# Patient Record
Sex: Female | Born: 1960 | Race: Black or African American | Hispanic: No | Marital: Single | Smoking: Current some day smoker
Health system: Southern US, Community
[De-identification: ages and names within clinical notes are randomized; demographics above are authoritative.]

## PROBLEM LIST (undated history)

## (undated) DIAGNOSIS — E785 Hyperlipidemia, unspecified: Secondary | ICD-10-CM

## (undated) DIAGNOSIS — I422 Other hypertrophic cardiomyopathy: Secondary | ICD-10-CM

## (undated) DIAGNOSIS — E119 Type 2 diabetes mellitus without complications: Secondary | ICD-10-CM

## (undated) DIAGNOSIS — I10 Essential (primary) hypertension: Secondary | ICD-10-CM

## (undated) DIAGNOSIS — I2510 Atherosclerotic heart disease of native coronary artery without angina pectoris: Secondary | ICD-10-CM

## (undated) HISTORY — DX: Type 2 diabetes mellitus without complications: E11.9

## (undated) HISTORY — DX: Hyperlipidemia, unspecified: E78.5

## (undated) HISTORY — PX: CORONARY ANGIOPLASTY WITH STENT PLACEMENT: SHX49

## (undated) HISTORY — DX: Other hypertrophic cardiomyopathy: I42.2

## (undated) HISTORY — DX: Essential (primary) hypertension: I10

---

## 2004-10-16 ENCOUNTER — Emergency Department: Payer: Self-pay | Admitting: Emergency Medicine

## 2004-10-16 ENCOUNTER — Other Ambulatory Visit: Payer: Self-pay

## 2005-02-27 ENCOUNTER — Ambulatory Visit: Payer: Self-pay | Admitting: Family Medicine

## 2011-06-07 ENCOUNTER — Inpatient Hospital Stay: Payer: Self-pay | Admitting: Internal Medicine

## 2011-07-13 ENCOUNTER — Inpatient Hospital Stay (HOSPITAL_COMMUNITY)
Admission: EM | Admit: 2011-07-13 | Discharge: 2011-07-14 | DRG: 313 | Disposition: A | Discharge status: Home / Self Care | Payer: Self-pay | Attending: Cardiology | Admitting: Cardiology

## 2011-07-13 ENCOUNTER — Emergency Department (HOSPITAL_COMMUNITY): Payer: Self-pay

## 2011-07-13 DIAGNOSIS — R079 Chest pain, unspecified: Principal | ICD-10-CM | POA: Diagnosis present

## 2011-07-13 DIAGNOSIS — E119 Type 2 diabetes mellitus without complications: Secondary | ICD-10-CM | POA: Diagnosis present

## 2011-07-13 DIAGNOSIS — I252 Old myocardial infarction: Secondary | ICD-10-CM

## 2011-07-13 DIAGNOSIS — Z87891 Personal history of nicotine dependence: Secondary | ICD-10-CM

## 2011-07-13 DIAGNOSIS — Z7982 Long term (current) use of aspirin: Secondary | ICD-10-CM

## 2011-07-13 DIAGNOSIS — I10 Essential (primary) hypertension: Secondary | ICD-10-CM | POA: Diagnosis present

## 2011-07-13 DIAGNOSIS — E785 Hyperlipidemia, unspecified: Secondary | ICD-10-CM | POA: Diagnosis present

## 2011-07-13 LAB — BASIC METABOLIC PANEL
GFR calc Af Amer: >60 mL/min (ref >60)
Glucose, Bld: 101 mg/dL — ABNORMAL HIGH (ref 70–99)
Sodium: 140 mEq/L (ref 135–145)

## 2011-07-13 LAB — CBC
MCH: 30.1 pg (ref 26.0–34.0)
MCHC: 34.1 g/dL (ref 30.0–36.0)
Platelets: 294 10*3/uL (ref 150–400)

## 2011-07-13 LAB — DIFFERENTIAL
Basophils Relative: 0 % (ref 0–1)
Eosinophils Absolute: 0.2 10*3/uL (ref 0.0–0.7)
Monocytes Absolute: 0.7 10*3/uL (ref 0.1–1.0)
Monocytes Relative: 5 % (ref 3–12)

## 2011-07-14 ENCOUNTER — Inpatient Hospital Stay (HOSPITAL_COMMUNITY): Payer: Self-pay

## 2011-07-14 LAB — DIFFERENTIAL
Basophils Absolute: 0 10*3/uL (ref 0.0–0.1)
Basophils Relative: 0 % (ref 0–1)
Eosinophils Relative: 1 % (ref 0–5)
Monocytes Absolute: 0.7 10*3/uL (ref 0.1–1.0)
Neutro Abs: 8.3 10*3/uL — ABNORMAL HIGH (ref 1.7–7.7)

## 2011-07-14 LAB — CK TOTAL AND CKMB (NOT AT ARMC)
CK, MB: 1.7 ng/mL (ref 0.3–4.0)
Relative Index: INVALID (ref 0.0–2.5)

## 2011-07-14 LAB — HEPATIC FUNCTION PANEL
ALT: 26 U/L (ref 0–35)
AST: 19 U/L (ref 0–37)
Albumin: 3.9 g/dL (ref 3.5–5.2)
Total Bilirubin: 0.3 mg/dL (ref 0.3–1.2)

## 2011-07-14 LAB — PROTIME-INR
INR: 1.06 (ref 0.00–1.49)
Prothrombin Time: 14 seconds (ref 11.6–15.2)

## 2011-07-14 LAB — HEMOGLOBIN A1C
Hgb A1c MFr Bld: 10.3 % — ABNORMAL HIGH (ref <5.7)
Mean Plasma Glucose: 249 mg/dL — ABNORMAL HIGH (ref <117)

## 2011-07-14 LAB — CARDIAC PANEL(CRET KIN+CKTOT+MB+TROPI)
Relative Index: INVALID (ref 0.0–2.5)
Total CK: 62 U/L (ref 7–177)
Total CK: 73 U/L (ref 7–177)

## 2011-07-14 LAB — GLUCOSE, CAPILLARY: Glucose-Capillary: 129 mg/dL — ABNORMAL HIGH (ref 70–99)

## 2011-07-14 LAB — PLATELET INHIBITION P2Y12: Platelet Function  P2Y12: 183 [PRU] — ABNORMAL LOW (ref 194–418)

## 2011-07-14 LAB — LIPID PANEL
Cholesterol: 142 mg/dL (ref 0–200)
HDL: 41 mg/dL (ref >39)
Total CHOL/HDL Ratio: 3.5 RATIO
Triglycerides: 94 mg/dL (ref <150)
VLDL: 19 mg/dL (ref 0–40)

## 2011-07-14 LAB — CBC
MCHC: 33.7 g/dL (ref 30.0–36.0)
RDW: 13 % (ref 11.5–15.5)

## 2011-07-14 LAB — APTT: aPTT: 105 seconds — ABNORMAL HIGH (ref 24–37)

## 2011-07-14 MED ORDER — TECHNETIUM TC 99M TETROFOSMIN IV KIT
10.0000 | PACK | Freq: Once | INTRAVENOUS | Status: AC | PRN
  Administered 2011-07-14: 10 via INTRAVENOUS

## 2011-07-14 MED ORDER — TECHNETIUM TC 99M TETROFOSMIN IV KIT
30.0000 | PACK | Freq: Once | INTRAVENOUS | Status: AC | PRN
  Administered 2011-07-14: 30 via INTRAVENOUS

## 2011-08-01 LAB — HM DIABETES EYE EXAM

## 2011-08-22 NOTE — Discharge Summary (Signed)
NAMESTEPHIE, Kirby               ACCOUNT NO.:  [id number]  MEDICAL RECORD NO.:  [id number]  LOCATION:  3740                         FACILITY:  [facility]  PHYSICIAN:  Rudra Hobbins N. Sharyn Lull, M.D. DATE OF BIRTH:  1960-12-21  DATE OF ADMISSION:  07/13/2011 DATE OF DISCHARGE:  07/14/2011                              DISCHARGE SUMMARY   ADMITTING DIAGNOSES: 1. Recurrent chest pain, rule out coronary insufficiency. 2. Coronary artery disease. 3. History of non-Q-wave myocardial infarction in the past status post     percutaneous coronary intervention to right coronary artery in the     past. 4. Hypertension. 5. Non-insulin-dependent diabetes mellitus. 6. Hypercholesteremia. 7. History of tobacco abuse. 8. Positive family history of coronary artery disease.  FINAL DIAGNOSES: 1. Status post chest pain, myocardial infarction ruled out, negative     Lexiscan Myoview. 2. Coronary artery disease. 3. History of non-Q-wave myocardial infarction in the past status post     percutaneous coronary intervention to right coronary artery. 4. Hypertension. 5. Non-insulin-dependent diabetes mellitus. 6. History of tobacco abuse. 7. Positive family history of coronary artery disease.  DISCHARGE HOME MEDICATIONS: 1. Lisinopril 10 mg 1 tablet daily. 2. Imdur 30 mg 1 tablet daily. 3. Plavix 75 mg 1 tablet daily. 4. Enteric-coated aspirin one tablet daily. 5. Glipizide 10 mg 1 tablet daily. 6. Lopressor 25 mg 1 tablet twice daily. 7. Simvastatin 40 mg 1 tablet daily. 8. Metformin 500 mg 1 tablet twice daily.  DIET:  Low-salt low-cholesterol 1800 calories ADA diet.  ACTIVITY:  As tolerated.  Follow up with me in 1 week.  CONDITION ON DISCHARGE:  Stable.  BRIEF HISTORY AND HOSPITAL COURSE:  Ms. Laurie Kirby is a 50 year old black female with past medical history significant for coronary artery disease, history of non-Q-wave myocardial infarction in the past, status post PCI to RCA in August, 2012,  hypertension, non-insulin-dependent diabetes mellitus, hypercholesteremia, history of tobacco abuse, positive family history of coronary artery disease came to the ER complaining of recurrent retrosternal chest pain described as throbbing, rated 8/10 while shopping in [employer].  Denies any nausea, vomiting, or diaphoresis.  Denies palpitation, lightheadedness, or syncope. Denies PND, orthopnea, or leg swelling.  Denies relation of chest pain to food, breathing, or movement.  The patient received morphine sulfate and IV nitro with relief of chest pain.  Denies cough fever chills. Denies any GERD symptoms.  PAST MEDICAL HISTORY:  As above.  PAST SURGICAL HISTORY:  She had PCI to RCA in the past.  ALLERGIES:  No known drug allergies.  MEDICATION AT HOME: She was on: 1. Aspirin 500 mg p.o. daily. 2. Plavix 75 mg p.o. daily. 3. Lopressor 25 mg p.o. b.i.d. 4. Lisinopril 10 mg p.o. daily. 5. Imdur 30 mg p.o. daily. 6. Metformin 500 mg p.o. b.i.d. 7. Glipizide 10 mg p.o. daily. 8. Simvastatin 40 mg p.o. daily.  SOCIAL HISTORY:  She is single, has 3 children.  Smoked half pack per day for 33 years, quit in May 2011.  She drinks beer occasionally socially and worked for [employer] in the past.  FAMILY HISTORY:  Father died of MI at the age of 49.  Mother died at the age of 49  due to sudden cardiac death.  One sister had amputation of the foot, two sisters and two brothers in good health.  PHYSICAL EXAMINATION:  GENERAL:  She was alert, awake, oriented x3, in no acute distress. VITAL SIGNS:  Blood pressure was 125/77, pulse was 67 and regular. HEENT:  Conjunctivae pink. NECK:  Supple.  No JVD.  No bruit. LUNGS:  Clear to auscultation without rhonchi or rales. CARDIOVASCULAR:  S1 and S2 was normal.  There was soft systolic murmur. There was no S3 or S4 gallop. ABDOMEN:  Soft.  Bowel sounds were present.  Nontender. EXTREMITIES:  No clubbing, cyanosis, or edema.  Her EKG showed  normal sinus rhythm with no acute ischemic changes.  LABORATORY DATA:  Hemoglobin was 13.5, hematocrit 39.6, Parrales count of 13.6 with no shift to the left.  Sodium was 140, potassium 4.0, chloride 106, bicarb 26, glucose 101, BUN 11, creatinine 0.52.  Hemoglobin A1c was elevated, it was 10.3.  Three sets of cardiac enzymes were normal. Cholesterol was 142, triglycerides 94, LDL of 82, HDL of 41.  Chest x-ray showed no acute cardiopulmonary process. Lexiscan showed no evidence of infarction or ischemia with EF of 62%, normal wall motion.  BRIEF HOSPITAL COURSE:  The patient was admitted to telemetry unit.  MI was ruled out by serial enzymes and EKG.  The patient subsequently underwent Lexiscan Myoview which showed no evidence of reversible ischemia.  The patient did not have any further episodes of chest pain during the hospital stay and was discharged home in stable condition on her home medications.  The patient was advised to follow up with me in 1 week.  CONDITION ON DISCHARGE:  Stable.     Laurie Kirby. Sharyn Lull, M.D.     [facility]  D:  08/07/2011  T:  08/08/2011  Job:  [id number]  Electronically Signed by Rinaldo Cloud M.D. on 08/22/2011 10:02:48 AM

## 2013-02-11 LAB — HM DIABETES EYE EXAM

## 2014-01-25 ENCOUNTER — Ambulatory Visit: Payer: Self-pay

## 2014-03-15 DIAGNOSIS — N950 Postmenopausal bleeding: Secondary | ICD-10-CM | POA: Insufficient documentation

## 2014-05-05 LAB — HM DIABETES EYE EXAM

## 2014-06-28 DEATH — deceased

## 2014-08-10 ENCOUNTER — Ambulatory Visit: Payer: Self-pay

## 2014-10-30 IMAGING — US US PELV - US TRANSVAGINAL
1 series · 13 of 25 positions shown · non-contrast
Comparison: Previous pelvic MRI dated February 27, 2005

CLINICAL DATA: Postmenopausal uterine bleeding

EXAM:
TRANSABDOMINAL AND TRANSVAGINAL ULTRASOUND OF PELVIS
TECHNIQUE: Both transabdominal and transvaginal ultrasound examinations of the
pelvis were performed. Transabdominal technique was performed for
global imaging of the pelvis including uterus, ovaries, adnexal
regions, and pelvic cul-de-sac. It was necessary to proceed with
endovaginal exam following the transabdominal exam to visualize the
uterus and endometrium and adnexal structures.

[Series 1: us pelv - us transvaginal · 0.28mm/px · 13 of 78 slices shown]
[im 1/78]
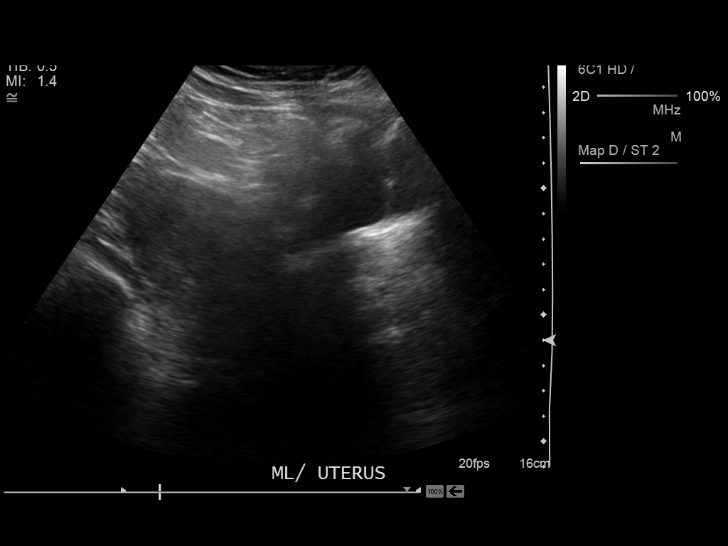
[im 7/78]
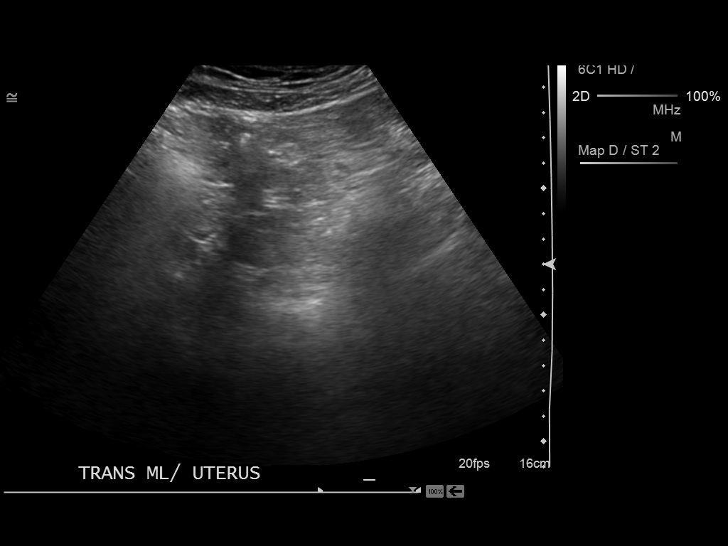
[im 13/78]
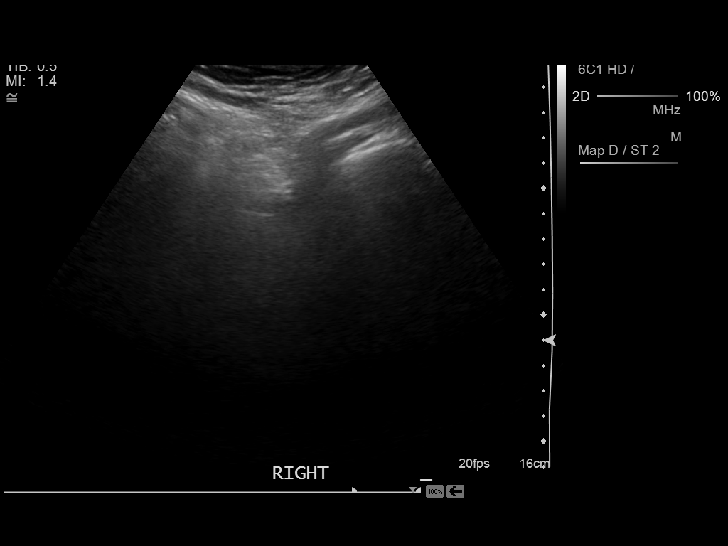
[im 20/78]
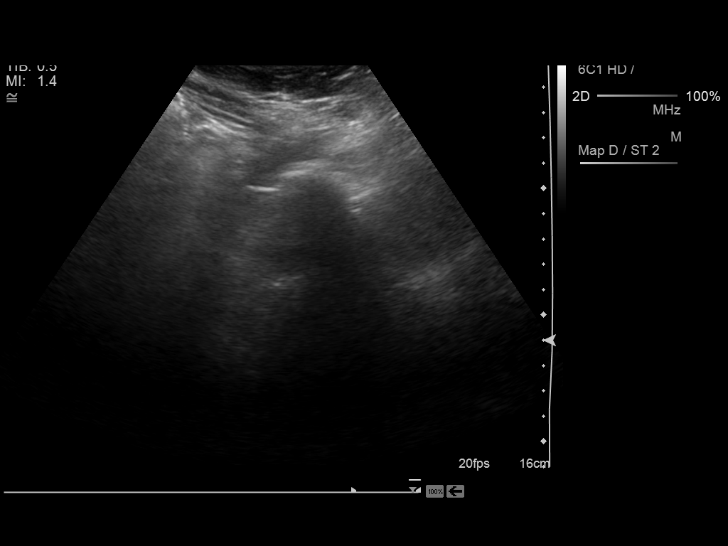
[im 26/78]
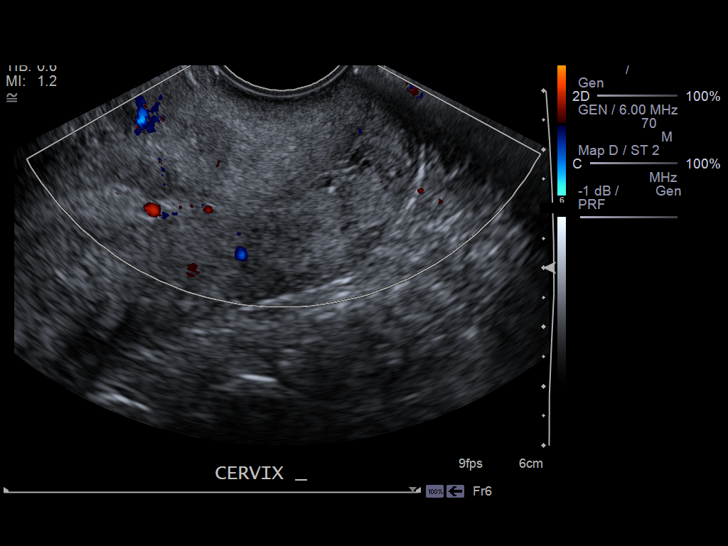
[im 33/78]
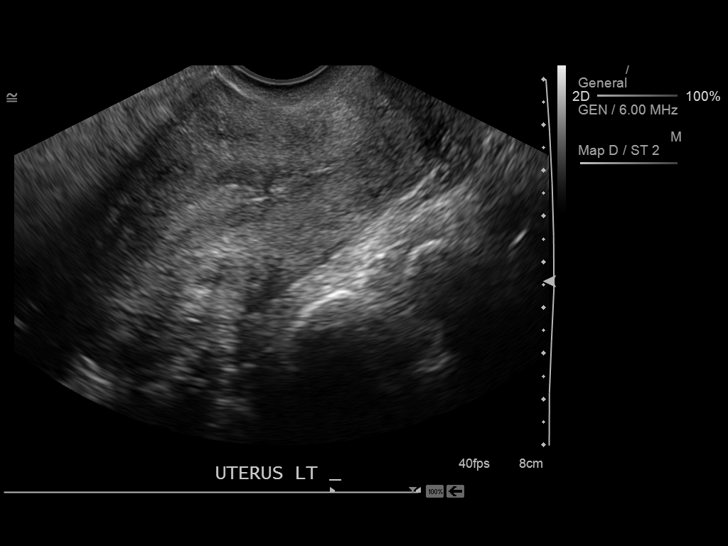
[im 39/78]
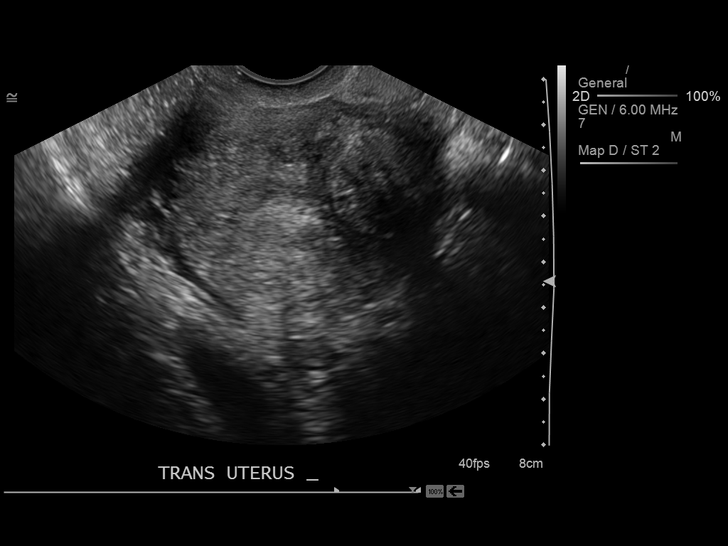
[im 45/78]
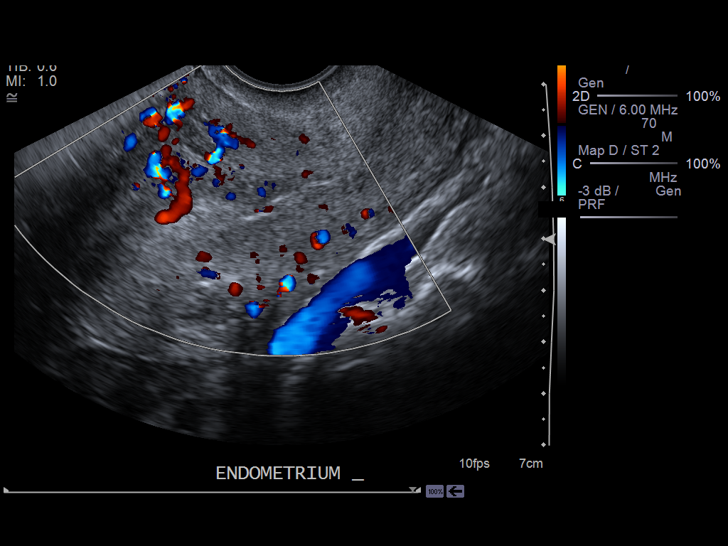
[im 52/78]
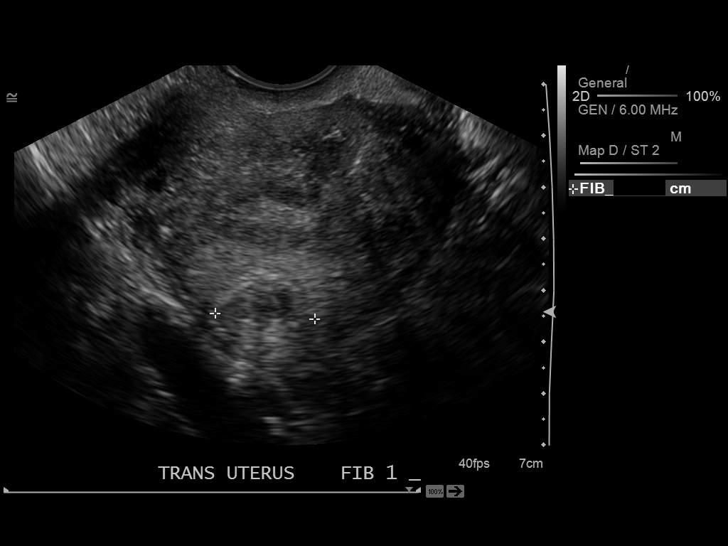
[im 58/78]
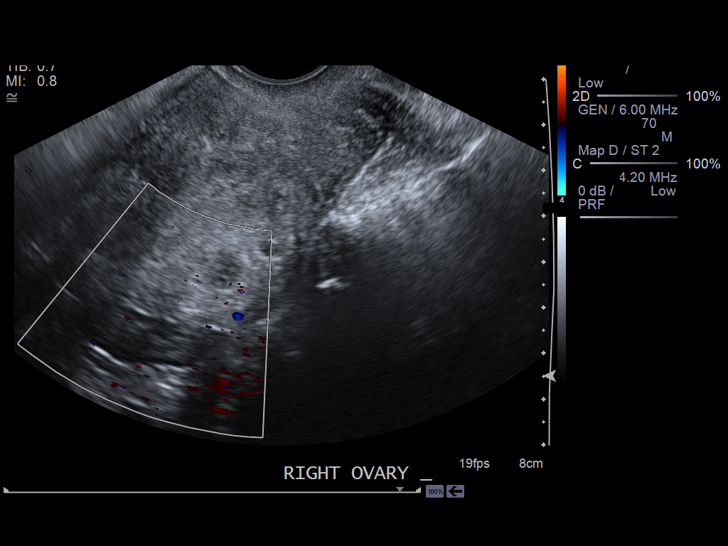
[im 65/78]
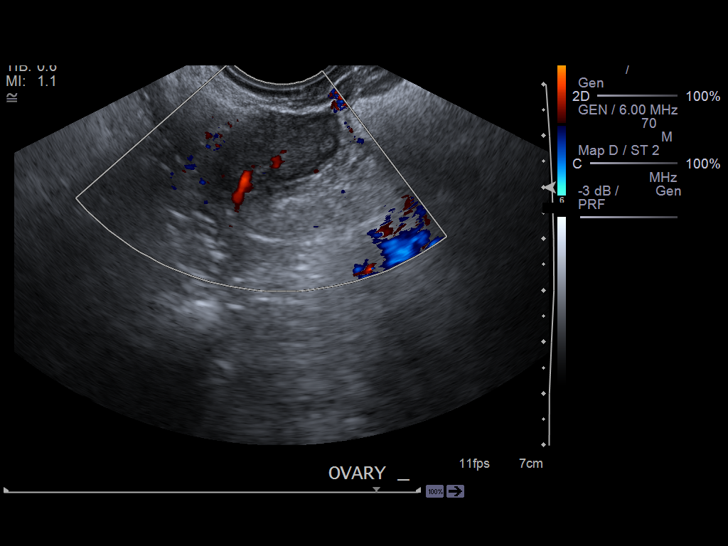
[im 71/78]
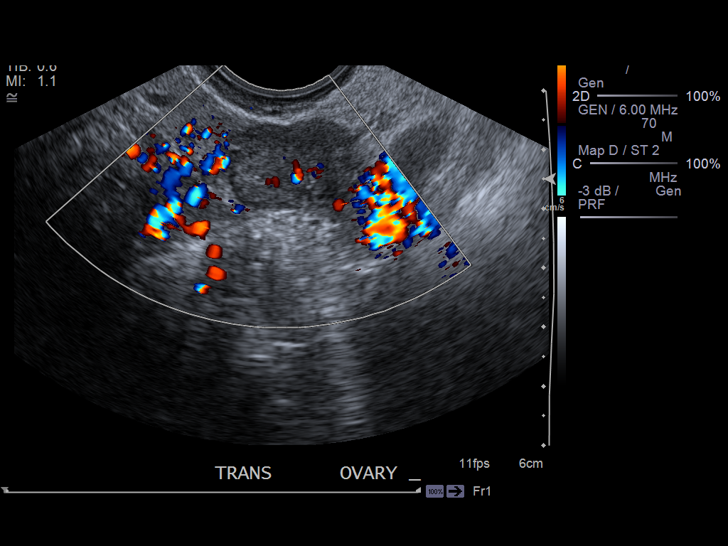
[im 78/78]
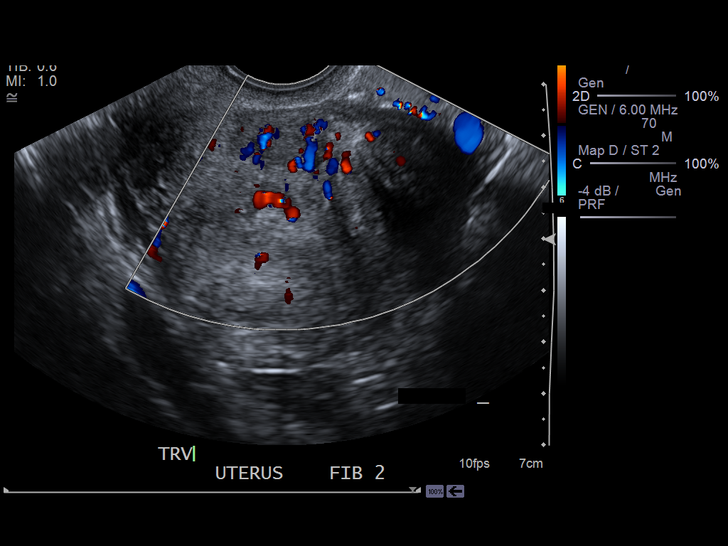

[13 of 25 positions shown; findings below may reference images not displayed]

FINDINGS: Uterus

Measurements: 7.9 x 3.9 x 6.4 cm. The echotexture of the uterus is
heterogeneous with multiple hypoechoic masses. The largest lies to
the left of midline posteriorly in the fundus and measures 3.1 x
x 3.6 cm. This is consistent with a leiomyoma. A second fibroid lies
more inferiorly in the posterior right lateral aspect of the uterine
corpus and measures 2.2 x 1.7 x 1.9 cm.

Endometrium

Thickness: 8 mm.  No focal abnormality visualized.

Right ovary

Measurements: 3.3 x 1.2 x 2.3 cm.. Normal appearance/no adnexal
mass.

Left ovary

Measurements: 3.1 x 1.8 x 2.2 cm. Normal appearance/no adnexal mass.

Other findings

There is a tiny amount of free fluid within the cul de sac.
IMPRESSION: 1. There are multiple hypoechoic foci within the uterus compatible
with fibroids with the largest lying to the left posteriorly in the
fundus measuring 3.6 cm in greatest dimension. This has increased in
size since the previous study.
2. The endometrial stripe measures 8 mm. In the setting of
post-menopausal bleeding, endometrial sampling is indicated to
exclude carcinoma. If results are benign, sonohysterogram should be
considered for focal lesion work-up. (Ref: Radiological Reasoning:
Algorithmic Workup of Abnormal Vaginal Bleeding with Endovaginal
Sonography and Sonohysterography. AJR 5118; 191:S68-73)
3. The ovaries are normal in echotexture and size for age. There is
a trace of free fluid in the cul de sac.

## 2015-04-13 ENCOUNTER — Other Ambulatory Visit: Payer: Self-pay

## 2015-04-14 LAB — CBC AND DIFFERENTIAL
HEMATOCRIT: 40 % (ref 36–46)
Hemoglobin: 13.5 g/dL (ref 12.0–16.0)
NEUTROS ABS: 11 /uL
Platelets: 309 10*3/uL (ref 150–399)

## 2015-04-25 ENCOUNTER — Ambulatory Visit: Payer: Self-pay

## 2015-05-09 ENCOUNTER — Other Ambulatory Visit: Payer: Self-pay

## 2015-05-09 LAB — LIPID PANEL
Cholesterol: 247 mg/dL — AB (ref 0–200)
HDL: 41 mg/dL (ref 35–70)
LDL CALC: 174 mg/dL
Triglycerides: 159 mg/dL (ref 40–160)

## 2015-05-09 LAB — HEMOGLOBIN A1C: Hemoglobin A1C: 10.4

## 2015-05-09 LAB — BASIC METABOLIC PANEL
BUN: 13 mg/dL (ref 4–21)
Creatinine: 0.6 mg/dL (ref 0.5–1.1)
GLUCOSE: 172 mg/dL
Potassium: 4.8 mmol/L (ref 3.4–5.3)
Sodium: 143 mmol/L (ref 137–147)

## 2015-05-11 ENCOUNTER — Ambulatory Visit: Payer: Self-pay | Admitting: Ophthalmology

## 2015-05-30 ENCOUNTER — Ambulatory Visit: Payer: Self-pay

## 2015-06-06 ENCOUNTER — Other Ambulatory Visit: Payer: Self-pay

## 2015-06-06 LAB — CBC AND DIFFERENTIAL: WBC: 16 10*3/mL

## 2015-07-27 ENCOUNTER — Ambulatory Visit: Payer: Self-pay

## 2015-07-27 DIAGNOSIS — E119 Type 2 diabetes mellitus without complications: Secondary | ICD-10-CM | POA: Insufficient documentation

## 2015-07-27 DIAGNOSIS — I2510 Atherosclerotic heart disease of native coronary artery without angina pectoris: Secondary | ICD-10-CM | POA: Insufficient documentation

## 2015-08-10 ENCOUNTER — Ambulatory Visit: Payer: Self-pay | Admitting: Ophthalmology

## 2015-08-10 LAB — HM DIABETES EYE EXAM

## 2015-10-11 ENCOUNTER — Ambulatory Visit
Admission: RE | Admit: 2015-10-11 | Discharge: 2015-10-11 | Disposition: A | Discharge status: Home / Self Care | Payer: Self-pay | Source: Ambulatory Visit | Attending: Oncology | Admitting: Oncology

## 2015-10-11 ENCOUNTER — Ambulatory Visit: Payer: Self-pay

## 2015-10-11 ENCOUNTER — Encounter: Payer: Self-pay | Admitting: *Deleted

## 2015-10-11 ENCOUNTER — Ambulatory Visit: Payer: Self-pay | Attending: Oncology | Admitting: *Deleted

## 2015-10-11 VITALS — BP 128/76 | HR 76 | Temp 98.6°F | Resp 20 | Ht 66.54 in | Wt 160.7 lb

## 2015-10-11 DIAGNOSIS — Z0000 Encounter for general adult medical examination without abnormal findings: Secondary | ICD-10-CM

## 2015-10-11 NOTE — Patient Instructions (Signed)
Gave patient hand-out, Women Staying Healthy, Active and Well from [facility], with education on breast health, pap smears, heart and colon health. 

## 2015-10-11 NOTE — Progress Notes (Signed)
Subjective:     Patient ID: Laurie LipaBeverly Kirby, female   DOB: [birth year], 54 y.o.   MRN: [id number]  HPI   Review of Systems     Objective:   Physical Exam  Pulmonary/Chest: Right breast exhibits no inverted nipple, no mass, no nipple discharge, no skin change and no tenderness. Left breast exhibits no inverted nipple, no mass, no nipple discharge, no skin change and no tenderness. Breasts are symmetrical.    Abdominal: There is no splenomegaly or hepatomegaly.  Genitourinary: No labial fusion. There is no rash, tenderness, lesion or injury on the right labia. There is no rash, tenderness, lesion or injury on the left labia. Cervix exhibits no motion tenderness, no discharge and no friability. Right adnexum displays no mass, no tenderness and no fullness. Left adnexum displays tenderness. Left adnexum displays no mass and no fullness. No erythema, tenderness or bleeding in the vagina. No foreign body around the vagina. No signs of injury around the vagina. Vaginal discharge found.  [place] non-odorous discharge noted on exam       Assessment:     54 year old Black [facility] pap smear and mammogram.  Clinical breast exam with diffuse fibroglandular tissue.  Taught self breast awareness.  Specimen collected for pap smear.  Patient refused rectal exam.  Patient has been screened for eligibility.  She does not have any insurance, Medicare or Medicaid.  She also meets financial eligibility.  Hand-out given on the Affordable Care Act.     Plan:     Screening mammogram ordered.  Specimen sent to lab.  Will follow-up per protocol.

## 2015-10-18 LAB — PAP LB AND HPV HIGH-RISK
HPV, HIGH-RISK: POSITIVE — AB
PAP SMEAR COMMENT: 0

## 2015-10-19 ENCOUNTER — Encounter: Payer: Self-pay | Admitting: *Deleted

## 2015-10-19 NOTE — Progress Notes (Signed)
Mailed letter to patient to inform her of her normal mammogram and HPV+ pap smear.  Next pap and mammogram due in one year.  [facility] [place].

## 2015-11-02 ENCOUNTER — Ambulatory Visit: Payer: Self-pay

## 2015-11-30 ENCOUNTER — Ambulatory Visit: Payer: Self-pay

## 2015-11-30 DIAGNOSIS — R87619 Unspecified abnormal cytological findings in specimens from cervix uteri: Secondary | ICD-10-CM | POA: Insufficient documentation

## 2015-11-30 DIAGNOSIS — B373 Candidiasis of vulva and vagina: Secondary | ICD-10-CM | POA: Insufficient documentation

## 2015-11-30 DIAGNOSIS — B3731 Acute candidiasis of vulva and vagina: Secondary | ICD-10-CM | POA: Insufficient documentation

## 2015-12-05 ENCOUNTER — Ambulatory Visit: Payer: Self-pay

## 2015-12-07 ENCOUNTER — Ambulatory Visit: Payer: Self-pay

## 2015-12-07 DIAGNOSIS — N898 Other specified noninflammatory disorders of vagina: Secondary | ICD-10-CM | POA: Insufficient documentation

## 2015-12-08 DIAGNOSIS — N898 Other specified noninflammatory disorders of vagina: Secondary | ICD-10-CM

## 2015-12-08 DIAGNOSIS — I25119 Atherosclerotic heart disease of native coronary artery with unspecified angina pectoris: Secondary | ICD-10-CM

## 2015-12-08 DIAGNOSIS — R87619 Unspecified abnormal cytological findings in specimens from cervix uteri: Secondary | ICD-10-CM

## 2015-12-08 DIAGNOSIS — B373 Candidiasis of vulva and vagina: Secondary | ICD-10-CM

## 2015-12-08 DIAGNOSIS — E119 Type 2 diabetes mellitus without complications: Secondary | ICD-10-CM

## 2015-12-08 DIAGNOSIS — B3731 Acute candidiasis of vulva and vagina: Secondary | ICD-10-CM

## 2016-01-04 ENCOUNTER — Other Ambulatory Visit: Payer: Self-pay

## 2016-01-04 DIAGNOSIS — E119 Type 2 diabetes mellitus without complications: Secondary | ICD-10-CM

## 2016-01-04 DIAGNOSIS — E785 Hyperlipidemia, unspecified: Secondary | ICD-10-CM

## 2016-01-05 LAB — COMPREHENSIVE METABOLIC PANEL
A/G RATIO: 1.3 (ref 1.1–2.5)
ALBUMIN: 4.2 g/dL (ref 3.5–5.5)
ALT: 14 IU/L (ref 0–32)
AST: 12 IU/L (ref 0–40)
Alkaline Phosphatase: 63 IU/L (ref 39–117)
BUN / CREAT RATIO: 11 (ref 9–23)
BUN: 11 mg/dL (ref 6–24)
CHLORIDE: 104 mmol/L (ref 96–106)
CO2: 23 mmol/L (ref 18–29)
Calcium: 9.4 mg/dL (ref 8.7–10.2)
Creatinine, Ser: 0.98 mg/dL (ref 0.57–1.00)
GFR, EST AFRICAN AMERICAN: 76 mL/min/{1.73_m2} (ref >59)
GFR, EST NON AFRICAN AMERICAN: 66 mL/min/{1.73_m2} (ref >59)
Globulin, Total: 3.2 g/dL (ref 1.5–4.5)
Glucose: 161 mg/dL — ABNORMAL HIGH (ref 65–99)
POTASSIUM: 4.6 mmol/L (ref 3.5–5.2)
Sodium: 145 mmol/L — ABNORMAL HIGH (ref 134–144)
TOTAL PROTEIN: 7.4 g/dL (ref 6.0–8.5)

## 2016-01-05 LAB — TSH: TSH: 0.961 u[IU]/mL (ref 0.450–4.500)

## 2016-01-05 LAB — CBC WITH DIFFERENTIAL/PLATELET
BASOS: 0 %
Basophils Absolute: 0.1 10*3/uL (ref 0.0–0.2)
EOS (ABSOLUTE): 0.3 10*3/uL (ref 0.0–0.4)
EOS: 2 %
HEMOGLOBIN: 13.7 g/dL (ref 11.1–15.9)
Hematocrit: 40.9 % (ref 34.0–46.6)
IMMATURE GRANS (ABS): 0 10*3/uL (ref 0.0–0.1)
Immature Granulocytes: 0 %
LYMPHS: 42 %
Lymphocytes Absolute: 7 10*3/uL — ABNORMAL HIGH (ref 0.7–3.1)
MCH: 28.8 pg (ref 26.6–33.0)
MCHC: 33.5 g/dL (ref 31.5–35.7)
MCV: 86 fL (ref 79–97)
MONOCYTES: 4 %
Monocytes Absolute: 0.7 10*3/uL (ref 0.1–0.9)
NEUTROS ABS: 8.7 10*3/uL — AB (ref 1.4–7.0)
Neutrophils: 52 %
Platelets: 326 10*3/uL (ref 150–379)
RBC: 4.75 x10E6/uL (ref 3.77–5.28)
RDW: 13.5 % (ref 12.3–15.4)
WBC: 16.7 10*3/uL — ABNORMAL HIGH (ref 3.4–10.8)

## 2016-01-05 LAB — LIPID PANEL
CHOL/HDL RATIO: 5.7 ratio — AB (ref 0.0–4.4)
Cholesterol, Total: 240 mg/dL — ABNORMAL HIGH (ref 100–199)
HDL: 42 mg/dL (ref >39)
LDL CALC: 151 mg/dL — AB (ref 0–99)
Triglycerides: 236 mg/dL — ABNORMAL HIGH (ref 0–149)
VLDL Cholesterol Cal: 47 mg/dL — ABNORMAL HIGH (ref 5–40)

## 2016-01-05 LAB — HEMOGLOBIN A1C
Est. average glucose Bld gHb Est-mCnc: 240 mg/dL
Hgb A1c MFr Bld: 10 % — ABNORMAL HIGH (ref 4.8–5.6)

## 2016-01-11 ENCOUNTER — Ambulatory Visit: Payer: Self-pay | Admitting: Urology

## 2016-01-11 VITALS — BP 126/68 | HR 74 | Ht 63.0 in | Wt 161.0 lb

## 2016-01-11 DIAGNOSIS — E785 Hyperlipidemia, unspecified: Secondary | ICD-10-CM

## 2016-01-11 DIAGNOSIS — K219 Gastro-esophageal reflux disease without esophagitis: Secondary | ICD-10-CM

## 2016-01-11 DIAGNOSIS — E119 Type 2 diabetes mellitus without complications: Secondary | ICD-10-CM

## 2016-01-11 DIAGNOSIS — L280 Lichen simplex chronicus: Secondary | ICD-10-CM

## 2016-01-11 DIAGNOSIS — R87619 Unspecified abnormal cytological findings in specimens from cervix uteri: Secondary | ICD-10-CM

## 2016-01-11 MED ORDER — CLOBETASOL PROPIONATE 0.05 % EX CREA: 1.0000 "application " | TOPICAL_CREAM | Freq: Two times a day (BID) | CUTANEOUS | Status: DC

## 2016-01-11 NOTE — Progress Notes (Signed)
       Patient: Laurie Kirby Female    DOB: March 31, 1961   55 y.o.   MRN: [id number] Visit Date: 01/11/2016  Today's Provider: [facility]   No chief complaint on file.  Subjective:    HPI Patient is a 55 year old African American female with a history of an abnormal Pap smear, HTN, DM, HLD and GERD.    Abnormal Pap smear HPV+ pap smear. Next pap in one year.   Breast cancer screening Normal mammogram.  Next mammogram in one year.   HTN BP good control.  Continue present medications.    DM HBA1C is 10.0 %.  Patient is on metformin and glipizide.  She has not seen endocrinology.  She does not want to start insulin.  She does not eat breakfast.  Eats a sandwich for lunch.  Eats smothered chicken, [employer], leftovers, etc for dinner.    HLD LDL 151 mg/dL.  Currently on simvastatin 40 mg daily.    GERD Controlled with Pepcid.     No Known Allergies Previous Medications   ASPIRIN 81 MG TABLET    Take 81 mg by mouth daily.   CLOPIDOGREL (PLAVIX) 75 MG TABLET    Take 75 mg by mouth daily.   FAMOTIDINE (PEPCID) 20 MG TABLET    Take 20 mg by mouth 2 (two) times daily.   GLIPIZIDE (GLUCOTROL) 10 MG TABLET    Take 10 mg by mouth daily before breakfast.   ISOSORBIDE DINITRATE (ISORDIL) 30 MG TABLET    Take 30 mg by mouth 4 (four) times daily.   LISINOPRIL (PRINIVIL,ZESTRIL) 10 MG TABLET    Take 10 mg by mouth daily.   METFORMIN (GLUCOPHAGE) 1000 MG TABLET    Take 1,000 mg by mouth 2 (two) times daily with a meal.   METOPROLOL TARTRATE (LOPRESSOR) 25 MG TABLET    Take 25 mg by mouth 2 (two) times daily.   SIMVASTATIN (ZOCOR) 40 MG TABLET    Take 40 mg by mouth daily.    Review of Systems  Social History  Substance Use Topics  . Smoking status: Former Smoker    Quit date: 12/07/2013  . Smokeless tobacco: Not on file  . Alcohol Use: No     Comment: occasional   Objective:   BP 126/68 mmHg  Pulse 74  Ht 5\' 3"  (1.6 m)  Wt 161 lb (73.029 kg)  BMI 28.53 kg/m2   SpO2 99%  Physical Exam  Constitutional: She appears well-developed and well-nourished.  HENT:  Head: Normocephalic and atraumatic.  Cardiovascular: Normal rate and regular rhythm.   Pulmonary/Chest: Effort normal and breath sounds normal.  Genitourinary: Vagina normal.  Areas of lichenification on the external labia bilaterally        Assessment & Plan:     Abnormal PAP smear  - repeat in one year  Breast cancer screening  - mammogram normal; repeat in one year  HTN  - good control; continue medications  DM  - uncontrolled; HBGA1C is 10 %; counsel on diet  - appointment with endocrinology  - appointment for eye exam  HLD  - LDL > 140; counseled on diet  - repeat labs in one month  GERD  - controlled with Pepcid     [facility]  [facility]

## 2016-01-12 ENCOUNTER — Other Ambulatory Visit: Payer: Self-pay

## 2016-01-12 DIAGNOSIS — I25119 Atherosclerotic heart disease of native coronary artery with unspecified angina pectoris: Secondary | ICD-10-CM

## 2016-01-12 MED ORDER — MOMETASONE FUROATE 0.1 % EX SOLN: Freq: Every day | CUTANEOUS | Status: DC

## 2016-01-13 ENCOUNTER — Encounter: Payer: Self-pay | Admitting: Urology

## 2016-01-13 DIAGNOSIS — L280 Lichen simplex chronicus: Secondary | ICD-10-CM | POA: Insufficient documentation

## 2016-01-13 DIAGNOSIS — E785 Hyperlipidemia, unspecified: Secondary | ICD-10-CM | POA: Insufficient documentation

## 2016-02-13 ENCOUNTER — Institutional Professional Consult (permissible substitution): Payer: Self-pay

## 2016-03-05 ENCOUNTER — Ambulatory Visit: Payer: Self-pay

## 2016-03-07 ENCOUNTER — Telehealth: Payer: Self-pay | Admitting: Urology

## 2016-03-07 ENCOUNTER — Ambulatory Visit: Payer: Self-pay

## 2016-03-07 NOTE — Telephone Encounter (Signed)
Apt made for 5/18 at 7:30.

## 2016-03-07 NOTE — Telephone Encounter (Signed)
Pt wants to rescheduled canceled 5/11 apt for 5/19. Return call with time.

## 2016-03-14 ENCOUNTER — Ambulatory Visit: Payer: Self-pay

## 2016-04-16 ENCOUNTER — Ambulatory Visit: Payer: Self-pay | Admitting: Endocrinology

## 2016-04-16 VITALS — BP 152/88 | HR 69

## 2016-04-16 DIAGNOSIS — E119 Type 2 diabetes mellitus without complications: Secondary | ICD-10-CM

## 2016-04-16 NOTE — Progress Notes (Signed)
Patient ID: Laurie LipaBeverly Shuffield, female   DOB: 1961/09/30, 55 y.o.   MRN: [id number]  Assessment:   Her diabetes control is poor. However, refuses to make any changes to diet and refuses additional diabetes medications.     Plan:    1.  Rx changes: Increased glipizide to 10 mg twice a day.  2.  Education: Reviewed diabetes management:  Appropriate A1C target, avoid hypoglycemia,  blood pressure (<140/80), and cholesterol (LDL <100). -   Reviewed importance of good glycemic control to reduce risk for complications.   3.   Get regular physical activity at least 30 minutes a day of moderate intensity most days of the week. Patient did not want to start exercise.   This discussion took at least 15 minutes out of a total visit time of 25 minutes today.  4. Follow up: I recommend patient follow-up with me at 3 months.   5. Referrals: none  6. T2DM Reconciliation  Patient was not interested in injectables or changing exercise regimen. Not interested in changing diet. Counseled to increase the glipizide from 10mg  to 20mg . Patient follow up in 2 months with the [facility] night. A1c should be pulled before the next visit -- future order is in system.    Subjective:    Laurie Kirby is a 55 y.o. female who is seen in follow up forType 2 diabetes from 02/13/2016  complicated by cardiovascular disease with a stent placement. Was diagnosed with diabetes during this admission. Most recent A1c 10.0 in March.   Cardiovascular risk factors: Previous smoker -- quit approximately one year ago.    Patient's glucometer was not available.   Laurie LipaBeverly Suriano is performing SMBG on average 1 times per day.   Average over last 3 months,checks in morning fasting -- averages are 100-120.    Denies symptoms of hypoglycemia.  Denies polyneuropathy, polyuria. Acknowledges dysphagia but states that has happened whole life. Food gets caught and requires water to go down.  Denies chest pain, shortness of  breath, edema, foot lesions or ulcers.  Denies severe hypoglycemia or admission to hospital for DKA.  Current exercise: Denies exercise.  Current diet: Not eating healthy.   The patient refuses to take any injectable medications for her diabetes.  The patient's history was reviewed and updated as appropriate.  No Known Allergies   Current outpatient prescriptions:  .  aspirin 81 MG tablet, Take 81 mg by mouth daily., Disp: , Rfl:  .  clopidogrel (PLAVIX) 75 MG tablet, Take 75 mg by mouth daily., Disp: , Rfl:  .  famotidine (PEPCID) 20 MG tablet, Take 20 mg by mouth 2 (two) times daily., Disp: , Rfl:  .  glipiZIDE (GLUCOTROL) 10 MG tablet, Take 10 mg by mouth daily before breakfast., Disp: , Rfl:  .  isosorbide dinitrate (ISORDIL) 30 MG tablet, Take 30 mg by mouth 4 (four) times daily., Disp: , Rfl:  .  lisinopril (PRINIVIL,ZESTRIL) 10 MG tablet, Take 10 mg by mouth daily., Disp: , Rfl:  .  metFORMIN (GLUCOPHAGE) 1000 MG tablet, Take 1,000 mg by mouth 2 (two) times daily with a meal., Disp: , Rfl:  .  metoprolol tartrate (LOPRESSOR) 25 MG tablet, Take 25 mg by mouth 2 (two) times daily., Disp: , Rfl:  .  simvastatin (ZOCOR) 40 MG tablet, Take 40 mg by mouth daily., Disp: , Rfl:  .  mometasone (ELOCON) 0.1 % lotion, Apply topically daily. To replace Clobetasol. (Patient not taking: Reported on 04/16/2016), Disp: 60 mL, Rfl: 0  Social  History   Social History  . Marital Status: Single    Spouse Name: N/A  . Number of Children: N/A  . Years of Education: N/A   Social History Main Topics  . Smoking status: Former Smoker    Quit date: 12/07/2013  . Smokeless tobacco: Not on file  . Alcohol Use: No     Comment: occasional  . Drug Use: No  . Sexual Activity: Not on file   Other Topics Concern  . Not on file   Social History Narrative    Family History  Problem Relation Age of Onset  . Breast cancer Maternal Aunt   . Diabetes type II Mother   . Diabetes type II Father   .  Heart disease Father     Review of Systems A 12 point review of systems was negative except for pertinent items noted in the HPI.   Objective:     Wt Readings from Last 3 Encounters:  03/05/16 158 lb (71.668 kg)  01/11/16 161 lb (73.029 kg)  10/11/15 160 lb 11.5 oz (72.9 kg)   BP 152/88 mmHg  Pulse 69  General appearance:  alert and appears stated age, in no distress      Eyes:  conjunctivae/corneas clear. EOM's intact. Sclera anicteric. Negative lid lag or proptosis     Neck: no adenopathy, supple, symmetrical, trachea midline  Thyroid:  Mobile, normal size, no palpable nodule  Lung: clear to auscultation bilaterally  Heart:  regular rate and rhythm, S1, S2 normal, no murmur, click, rub or gallop  Abdomen:  bowel sounds normal; negative bruits  Extremities: extremities normal, atraumatic, no cyanosis or edema     Pulses: DP & PT 2+ and symmetric.  Neuro: normal without focal findings, mental status, speech normal, alert and oriented x3, reflexes normal and symmetric and gait normal.   Feet: negative lesions or ulcers, 10 gram monofilament intact bilateral plantar surface     Lab Review No components found for: A1C GLUCOSE (mg/dL)  Date Value  69/62/9528 161*   GLUCOSE, BLD (mg/dL)  Date Value  41/32/4401 101*   CO2  Date Value  01/04/2016 23 mmol/L  07/13/2011 26 mEq/L   BUN (mg/dL)  Date Value  02/72/5366 11  05/09/2015 13  07/13/2011 11   CREATININE (mg/dL)  Date Value  44/12/4740 0.6   CREATININE, SER (mg/dL)  Date Value  59/56/3875 0.98  07/13/2011 0.52   No components found for: LDL,  LDLCALC,  LDLDIRECT Lab Results  Component Value Date   NA 145* 01/04/2016   K 4.6 01/04/2016   CL 104 01/04/2016   CO2 23 01/04/2016   BUN 11 01/04/2016   CREATININE 0.98 01/04/2016   GFRAA 76 01/04/2016   GFRNONAA 66 01/04/2016   GLU 172 05/09/2015   CALCIUM 9.4 01/04/2016   ALBUMIN 4.2 01/04/2016     DIABETES MELLITUS RESULTS: Lab Results   Component Value Date   HGBA1C 10.0* 01/04/2016   HGBA1C 10.4 05/09/2015   HGBA1C 10.3* 07/13/2011   Lab Results  Component Value Date   LDLCALC 151* 01/04/2016   CREATININE 0.98 01/04/2016   Lab Results  Component Value Date   CHOL 240* 01/04/2016   Lab Results  Component Value Date   LDLCALC 151* 01/04/2016   No components found for: CHOLLLDLDIRECT No components found for: MICROALB/CR Lab Results  Component Value Date   GFRAA 76 01/04/2016   GFRNONAA 66 01/04/2016

## 2016-04-16 NOTE — Progress Notes (Unsigned)
Patient ID: Laurie Kirby, female   DOB: 04-06-1961, 55 y.o.   MRN: [id number]  Assessment:   There are no diagnoses linked to this encounter.     Plan:    1.  Rx changes: ***  2.  Education: Reviewed diabetes management:  Appropriate A1C target, avoid hypoglycemia,  blood pressure (<140/80), and cholesterol (LDL <100). -   Reviewed importance of good glycemic control to reduce risk for complications.   3.   Get regular physical activity at least 30 minutes a day of moderate intensity most days of the week  This discussion took at least 15 minutes out of a total visit time of 25 minutes today.  4. Follow up: I recommend patient follow-up with me at {Time; 1 month to 1 year:14528::"3 months"}.   5. Referrals: {Blank multiple:19196::"none","CDE"," Clinical Pharmacist","Podiatry","Ophthomology"}     Subjective:    Laurie Kirby is a 55 y.o. female who is seen in follow up forType 2 diabetes from Visit date not found  complicated by {Blank multiple:19196::"retinopathy","nephropathy"," neuropathy","obesity","hypertension","dyslipidemia"}   Cardiovascular risk factors: {Blank multiple:19196::"hypertension","dyslipidemia","obesity","physical inactivity","albuminuria","chronic kidney disease","tobacco"}   Eye exam current (within one year): ***   Patient's glucometer {Blank multiple:19196::"was","was not"} downloaded and reviewed SMBG values {Actions; were/were not:15343} reviewed with the patient.  Laurie Kirby is performing SMBG on average *** times per day.   Average over last *** days is *** Standard deviation is ***  {Blank multiple:19196::"post prandial excursions","nocturnal hypoglycemia","post meal hypoglycemia", bedtime/overnight hyperglycemia","fasting hypoglycemia"," fasting hyperglycemia","overcorrection","rebound","high variability"}  Reports {Blank multiple:19196::"no","mild","moderate","severe"} symptoms of hypoglycemia. These include {Blank  multiple:19196::"nocturnal","fasting","daytime"} events.   Denies {Blank multiple:19196::"polyuria","polydypsia","polyphagia","weight loss"} Denies chest pain, shortness of breath, edema, foot lesions or ulcers.  Denies severe hypoglycemia or admission to hospital for DKA.  Current exercise: {Blank multiple:19196::"none","regular activity","mild intensity","moderate intensity","1-2 days/week","2-3 days/week","3-4 days/week","most days/week","every day"}  The patient's history was reviewed and updated as appropriate.  No Known Allergies   Current outpatient prescriptions:  .  aspirin 81 MG tablet, Take 81 mg by mouth daily., Disp: , Rfl:  .  clopidogrel (PLAVIX) 75 MG tablet, Take 75 mg by mouth daily., Disp: , Rfl:  .  famotidine (PEPCID) 20 MG tablet, Take 20 mg by mouth 2 (two) times daily., Disp: , Rfl:  .  glipiZIDE (GLUCOTROL) 10 MG tablet, Take 10 mg by mouth daily before breakfast., Disp: , Rfl:  .  isosorbide dinitrate (ISORDIL) 30 MG tablet, Take 30 mg by mouth 4 (four) times daily., Disp: , Rfl:  .  lisinopril (PRINIVIL,ZESTRIL) 10 MG tablet, Take 10 mg by mouth daily., Disp: , Rfl:  .  metFORMIN (GLUCOPHAGE) 1000 MG tablet, Take 1,000 mg by mouth 2 (two) times daily with a meal., Disp: , Rfl:  .  metoprolol tartrate (LOPRESSOR) 25 MG tablet, Take 25 mg by mouth 2 (two) times daily., Disp: , Rfl:  .  mometasone (ELOCON) 0.1 % lotion, Apply topically daily. To replace Clobetasol., Disp: 60 mL, Rfl: 0 .  simvastatin (ZOCOR) 40 MG tablet, Take 40 mg by mouth daily., Disp: , Rfl:   Social History   Social History  . Marital Status: Single    Spouse Name: N/A  . Number of Children: N/A  . Years of Education: N/A   Social History Main Topics  . Smoking status: Former Smoker    Quit date: 12/07/2013  . Smokeless tobacco: Not on file  . Alcohol Use: No     Comment: occasional  . Drug Use: No  . Sexual Activity: Not on file   Other Topics Concern  .  Not on file   Social  History Narrative    Family History  Problem Relation Age of Onset  . Breast cancer Maternal Aunt   . Diabetes type II Mother   . Diabetes type II Father   . Heart disease Father     Review of Systems A 12 point review of systems was negative except for pertinent items noted in the HPI.   Objective:     Wt Readings from Last 3 Encounters:  03/05/16 158 lb (71.668 kg)  01/11/16 161 lb (73.029 kg)  10/11/15 160 lb 11.5 oz (72.9 kg)   There were no vitals taken for this visit.  General appearance:  alert and appears stated age, in no distress      Eyes:  conjunctivae/corneas clear. EOM's intact. Sclera anicteric. Negative lid lag or proptosis     Neck: no adenopathy, supple, symmetrical, trachea midline  Thyroid:  Mobile, normal size, no palpable nodule  Lung: clear to auscultation bilaterally  Heart:  regular rate and rhythm, S1, S2 normal, no murmur, click, rub or gallop  Abdomen:  bowel sounds normal; negative bruits  Extremities: extremities normal, atraumatic, no cyanosis or edema     Pulses: DP & PT 2+ and symmetric.  Neuro: normal without focal findings, mental status, speech normal, alert and oriented x3, reflexes normal and symmetric and gait normal.   Feet: negative lesions or ulcers, 10 gram monofilament intact bilateral plantar surface     Lab Review No components found for: A1C GLUCOSE (mg/dL)  Date Value  40/98/1191 161*   GLUCOSE, BLD (mg/dL)  Date Value  47/82/9562 101*   CO2  Date Value  01/04/2016 23 mmol/L  07/13/2011 26 mEq/L   BUN (mg/dL)  Date Value  13/05/6577 11  05/09/2015 13  07/13/2011 11   CREATININE (mg/dL)  Date Value  46/96/2952 0.6   CREATININE, SER (mg/dL)  Date Value  84/13/2440 0.98  07/13/2011 0.52   No components found for: LDL,  LDLCALC,  LDLDIRECT Lab Results  Component Value Date   NA 145* 01/04/2016   K 4.6 01/04/2016   CL 104 01/04/2016   CO2 23 01/04/2016   BUN 11 01/04/2016   CREATININE 0.98  01/04/2016   GFRAA 76 01/04/2016   GFRNONAA 66 01/04/2016   GLU 172 05/09/2015   CALCIUM 9.4 01/04/2016   ALBUMIN 4.2 01/04/2016     DIABETES MELLITUS RESULTS: Lab Results  Component Value Date   HGBA1C 10.0* 01/04/2016   HGBA1C 10.4 05/09/2015   HGBA1C 10.3* 07/13/2011   Lab Results  Component Value Date   LDLCALC 151* 01/04/2016   CREATININE 0.98 01/04/2016   Lab Results  Component Value Date   CHOL 240* 01/04/2016   Lab Results  Component Value Date   LDLCALC 151* 01/04/2016   No components found for: CHOLLLDLDIRECT No components found for: MICROALB/CR Lab Results  Component Value Date   GFRAA 76 01/04/2016   GFRNONAA 66 01/04/2016

## 2016-04-23 ENCOUNTER — Ambulatory Visit: Payer: Self-pay | Admitting: Urology

## 2016-04-23 VITALS — BP 131/71 | HR 69 | Wt 155.0 lb

## 2016-04-23 DIAGNOSIS — E139 Other specified diabetes mellitus without complications: Secondary | ICD-10-CM

## 2016-04-23 DIAGNOSIS — E119 Type 2 diabetes mellitus without complications: Secondary | ICD-10-CM

## 2016-04-23 LAB — GLUCOSE, POCT (MANUAL RESULT ENTRY): POC Glucose: 109 mg/dl — AB (ref 70–99)

## 2016-04-23 NOTE — Progress Notes (Signed)
Patient: Laurie Kirby Female    DOB: 1960-12-01   55 y.o.   MRN: [id number] Visit Date: 04/23/2016  Today's Provider: [facility]   Chief Complaint  Patient presents with  . Diabetes    Would prefer to see Laurie Kirby   Subjective:    Diabetes   Patient is a 55 year old African American female with a history of an abnormal Pap smear, HTN, DM, HLD and GERD.    Abnormal Pap smear HPV+ pap smear. Next pap in one year.   Breast cancer screening Normal mammogram.  Next mammogram in one year.   HTN BP good control.  Continue present medications.    DM HBA1C is 10.0 %.  Patient is on metformin and glipizide.  She has seen endocrinology.  Recommended increasing glipizide to twice daily.  She wants to try diet first.    HLD LDL 151 mg/dL.  Currently on simvastatin 40 mg daily.    GERD Controlled with Pepcid.     No Known Allergies Previous Medications   ASPIRIN 81 MG TABLET    Take 81 mg by mouth daily.   CLOPIDOGREL (PLAVIX) 75 MG TABLET    Take 75 mg by mouth daily.   FAMOTIDINE (PEPCID) 20 MG TABLET    Take 20 mg by mouth 2 (two) times daily.   GLIPIZIDE (GLUCOTROL) 10 MG TABLET    Take 20 mg by mouth 2 (two) times daily.   ISOSORBIDE DINITRATE (ISORDIL) 30 MG TABLET    Take 30 mg by mouth 4 (four) times daily.   LISINOPRIL (PRINIVIL,ZESTRIL) 10 MG TABLET    Take 10 mg by mouth daily.   METFORMIN (GLUCOPHAGE) 1000 MG TABLET    Take 1,000 mg by mouth 2 (two) times daily with a meal.   METOPROLOL TARTRATE (LOPRESSOR) 25 MG TABLET    Take 25 mg by mouth 2 (two) times daily.   MOMETASONE (ELOCON) 0.1 % LOTION    Apply topically daily. To replace Clobetasol.   SIMVASTATIN (ZOCOR) 40 MG TABLET    Take 40 mg by mouth daily.    Review of Systems  Social History  Substance Use Topics  . Smoking status: Former Smoker    Quit date: 12/07/2013  . Smokeless tobacco: Not on file  . Alcohol Use: 0.0 oz/week    0 Standard drinks or equivalent per week       Comment:  wine on the weekends   Objective:   BP 131/71 mmHg  Pulse 69  Wt 155 lb (70.308 kg)  Physical Exam  Constitutional: She appears well-developed and well-nourished.  HENT:  Head: Normocephalic and atraumatic.  Cardiovascular: Normal rate and regular rhythm.   Pulmonary/Chest: Effort normal and breath sounds normal.  Genitourinary: Vagina normal.  Areas of lichenification on the external labia bilaterally        Assessment & Plan:     Abnormal PAP smear  - repeat in one year (09/2016)  Breast cancer screening  - mammogram normal; repeat in one year (09/2016)  HTN  - good control; continue medications  DM  - last HBGA1C is 10 %; recheck Hbg A1C  - appointment with endocrinology on 04/16/2016- increase glipizide to bid, but patient wants to try diet first  - appointment for eye exam  -patient to check BS tid, lancets given  -keep appointment with endo  HLD  - LDL > 140; counseled on diet  - repeat labs in one month  GERD  -  controlled with Pepcid     [facility]  [facility]

## 2016-04-24 LAB — HEMOGLOBIN A1C
Est. average glucose Bld gHb Est-mCnc: 186 mg/dL
Hgb A1c MFr Bld: 8.1 % — ABNORMAL HIGH (ref 4.8–5.6)

## 2016-05-02 ENCOUNTER — Ambulatory Visit: Payer: Self-pay | Admitting: Ophthalmology

## 2016-05-23 ENCOUNTER — Ambulatory Visit: Payer: Self-pay | Admitting: Ophthalmology

## 2016-05-23 LAB — HM DIABETES EYE EXAM

## 2016-06-11 ENCOUNTER — Ambulatory Visit: Payer: Self-pay

## 2016-07-16 ENCOUNTER — Encounter: Payer: Self-pay | Admitting: Endocrinology

## 2016-07-16 ENCOUNTER — Ambulatory Visit: Payer: Self-pay | Admitting: Endocrinology

## 2016-07-16 VITALS — BP 127/78 | HR 72 | Wt 157.0 lb

## 2016-07-16 DIAGNOSIS — E139 Other specified diabetes mellitus without complications: Secondary | ICD-10-CM

## 2016-07-16 MED ORDER — SIMVASTATIN 40 MG PO TABS: 40.0000 mg | ORAL_TABLET | Freq: Every day | ORAL | 4 refills | Status: DC

## 2016-07-16 MED ORDER — ISOSORBIDE MONONITRATE ER 30 MG PO TB24: 30.0000 mg | ORAL_TABLET | Freq: Every day | ORAL | 4 refills | Status: DC

## 2016-07-16 MED ORDER — METOPROLOL TARTRATE 25 MG PO TABS: 25.0000 mg | ORAL_TABLET | Freq: Every morning | ORAL | 4 refills | Status: DC

## 2016-07-16 MED ORDER — LISINOPRIL 10 MG PO TABS: 10.0000 mg | ORAL_TABLET | Freq: Every day | ORAL | 4 refills | Status: DC

## 2016-07-16 MED ORDER — METFORMIN HCL 1000 MG PO TABS: 1000.0000 mg | ORAL_TABLET | Freq: Two times a day (BID) | ORAL | 4 refills | Status: DC

## 2016-07-16 MED ORDER — GLIPIZIDE 10 MG PO TABS: 20.0000 mg | ORAL_TABLET | Freq: Every day | ORAL | 4 refills | Status: DC

## 2016-07-16 NOTE — Progress Notes (Signed)
Diabetes 2 years  Average 110 115 no high and no low  Foot itching    Assessment:   Type 2 diabetes with good control by history.       Plan:   No change. She can increase glipizide if blood sugars are high     Subjective:    Laurie Kirby is a 55 y.o. female who is seen in follow up forType 2 diabetes from 02/13/2016     Current Outpatient Prescriptions:  .  aspirin 81 MG tablet, Take 81 mg by mouth daily., Disp: , Rfl:  .  clopidogrel (PLAVIX) 75 MG tablet, Take 75 mg by mouth daily., Disp: , Rfl:  .  famotidine (PEPCID) 20 MG tablet, Take 20 mg by mouth 2 (two) times daily., Disp: , Rfl:  .  glipiZIDE (GLUCOTROL) 10 MG tablet, Take 2 tablets (20 mg total) by mouth daily., Disp: 100 tablet, Rfl: 4 .  isosorbide mononitrate (IMDUR) 30 MG 24 hr tablet, Take 1 tablet (30 mg total) by mouth daily., Disp: 100 tablet, Rfl: 4 .  lisinopril (PRINIVIL,ZESTRIL) 10 MG tablet, Take 1 tablet (10 mg total) by mouth daily., Disp: 100 tablet, Rfl: 4 .  metFORMIN (GLUCOPHAGE) 1000 MG tablet, Take 1 tablet (1,000 mg total) by mouth 2 (two) times daily with a meal., Disp: 200 tablet, Rfl: 4 .  metoprolol tartrate (LOPRESSOR) 25 MG tablet, Take 1 tablet (25 mg total) by mouth every morning., Disp: 100 tablet, Rfl: 4 .  mometasone (ELOCON) 0.1 % lotion, Apply topically daily. To replace Clobetasol., Disp: 60 mL, Rfl: 0 .  simvastatin (ZOCOR) 40 MG tablet, Take 1 tablet (40 mg total) by mouth daily., Disp: 100 tablet, Rfl: 4 No Known Allergies     Social History   Social History  . Marital status: Single    Spouse name: N/A  . Number of children: N/A  . Years of education: N/A   Social History Main Topics  . Smoking status: Former Smoker    Quit date: 12/07/2013  . Smokeless tobacco: Not on file  . Alcohol use 0.0 oz/week     Comment:  wine on the weekends  . Drug use: No  . Sexual activity: Not on file   Other Topics Concern  . Not on file   Social History Narrative  . No  narrative on file    Family History  Problem Relation Age of Onset  . Breast cancer Maternal Aunt   . Diabetes type II Mother   . Diabetes type II Father   . Heart disease Father     Review of Systems A 12 point review of systems was negative except for pertinent items noted in the HPI.   Objective:     Wt Readings from Last 3 Encounters:  04/23/16 155 lb (70.3 kg)  03/05/16 158 lb (71.7 kg)  01/11/16 161 lb (73 kg)   There were no vitals taken for this visit.  General appearance:  alert and appears stated age, in no distress      Eyes:  conjunctivae/corneas clear. EOM's intact. Sclera anicteric. Negative lid lag or proptosis     Neck: no adenopathy, supple, symmetrical, trachea midline  Thyroid:  Mobile, normal size, no palpable nodule  Lung: clear to auscultation bilaterally  Heart:  regular rate and rhythm, S1, S2 normal, no murmur, click, rub or gallop  Abdomen:  bowel sounds normal; negative bruits  Extremities: extremities normal, atraumatic, no cyanosis or edema     Pulses: DP &  PT 2+ and symmetric.  Neuro: normal without focal findings, mental status, speech normal, alert and oriented x3, reflexes normal and symmetric and gait normal.   Feet: negative lesions or ulcers, 10 gram monofilament intact bilateral plantar surface     Lab Review No components found for: A1C Glucose (mg/dL)  Date Value  16/10/960403/06/2016 161 (H)   Glucose, Bld (mg/dL)  Date Value  54/09/811909/15/2012 101 (H)   CO2  Date Value  01/04/2016 23 mmol/L  07/13/2011 26 mEq/L   BUN (mg/dL)  Date Value  14/78/295603/06/2016 11  05/09/2015 13  07/13/2011 11   Creatinine (mg/dL)  Date Value  21/30/865707/09/2015 0.6   Creatinine, Ser (mg/dL)  Date Value  84/69/629503/06/2016 0.98  07/13/2011 0.52   No components found for: LDL,  LDLCALC,  LDLDIRECT Lab Results  Component Value Date   NA 145 (H) 01/04/2016   K 4.6 01/04/2016   CL 104 01/04/2016   CO2 23 01/04/2016   BUN 11 01/04/2016   CREATININE 0.98 01/04/2016    GFRAA 76 01/04/2016   GFRNONAA 66 01/04/2016   GLU 172 05/09/2015   CALCIUM 9.4 01/04/2016   ALBUMIN 4.2 01/04/2016     DIABETES MELLITUS RESULTS: Lab Results  Component Value Date   HGBA1C 8.1 (H) 04/23/2016   HGBA1C 10.0 (H) 01/04/2016   HGBA1C 10.4 05/09/2015   Lab Results  Component Value Date   LDLCALC 151 (H) 01/04/2016   CREATININE 0.98 01/04/2016   Lab Results  Component Value Date   CHOL 240 (H) 01/04/2016   Lab Results  Component Value Date   LDLCALC 151 (H) 01/04/2016   No components found for: CHOLLLDLDIRECT No components found for: MICROALB/CR Lab Results  Component Value Date   GFRAA 76 01/04/2016   GFRNONAA 66 01/04/2016

## 2016-07-17 ENCOUNTER — Other Ambulatory Visit: Payer: Self-pay | Admitting: Internal Medicine

## 2016-07-17 DIAGNOSIS — I25119 Atherosclerotic heart disease of native coronary artery with unspecified angina pectoris: Secondary | ICD-10-CM

## 2016-08-28 ENCOUNTER — Other Ambulatory Visit: Payer: Self-pay | Admitting: Internal Medicine

## 2016-09-04 ENCOUNTER — Encounter: Payer: Self-pay | Admitting: Ophthalmology

## 2016-10-15 ENCOUNTER — Ambulatory Visit: Payer: Self-pay

## 2017-05-08 ENCOUNTER — Ambulatory Visit: Payer: Self-pay | Admitting: Adult Health Nurse Practitioner

## 2017-05-08 VITALS — BP 136/73 | Temp 98.2°F | Ht 63.0 in | Wt 161.6 lb

## 2017-05-08 DIAGNOSIS — I25119 Atherosclerotic heart disease of native coronary artery with unspecified angina pectoris: Secondary | ICD-10-CM

## 2017-05-08 DIAGNOSIS — E785 Hyperlipidemia, unspecified: Secondary | ICD-10-CM

## 2017-05-08 DIAGNOSIS — E119 Type 2 diabetes mellitus without complications: Secondary | ICD-10-CM

## 2017-05-08 LAB — GLUCOSE, POCT (MANUAL RESULT ENTRY): POC GLUCOSE: 241 mg/dL — AB (ref 70–99)

## 2017-05-08 MED ORDER — GLIPIZIDE 10 MG PO TABS: 20.0000 mg | ORAL_TABLET | Freq: Every day | ORAL | 1 refills | Status: DC

## 2017-05-08 MED ORDER — CLOPIDOGREL BISULFATE 75 MG PO TABS: 75.0000 mg | ORAL_TABLET | Freq: Every day | ORAL | 1 refills | Status: DC

## 2017-05-08 MED ORDER — ISOSORBIDE MONONITRATE ER 30 MG PO TB24: 30.0000 mg | ORAL_TABLET | Freq: Every day | ORAL | 1 refills | Status: DC

## 2017-05-08 MED ORDER — METFORMIN HCL 1000 MG PO TABS: 1000.0000 mg | ORAL_TABLET | Freq: Two times a day (BID) | ORAL | 1 refills | Status: DC

## 2017-05-08 MED ORDER — SIMVASTATIN 40 MG PO TABS
40.0000 mg | ORAL_TABLET | Freq: Every day | ORAL | 1 refills | Status: DC
Start: 2017-05-08 — End: 2017-08-28

## 2017-05-08 MED ORDER — LISINOPRIL 10 MG PO TABS: 10.0000 mg | ORAL_TABLET | Freq: Every day | ORAL | 1 refills | Status: DC

## 2017-05-08 MED ORDER — METOPROLOL TARTRATE 25 MG PO TABS: 25.0000 mg | ORAL_TABLET | Freq: Every morning | ORAL | 1 refills | Status: DC

## 2017-05-08 NOTE — Progress Notes (Signed)
Patient: Laurie Kirby Female    DOB: 19-Oct-1961   56 y.o.   MRN: [id number] Visit Date: 05/08/2017  Today's Provider: Jacelyn Pieah Doles-Johnson, NP   Chief Complaint  Patient presents with  . Follow-up   Subjective:    HPI    Last a1c over a year ago-8.1  Pt states she has been out of medications since February.  Pt states that she has increased her water intake and taking cinnamon pills and baby Aspirin. Pt states that she is not monitoring her diet or exercising.  States that she would like to get back on her medications.  Denies CP, HA or dizziness.      No Known Allergies Previous Medications   ASPIRIN 81 MG TABLET    Take 81 mg by mouth daily.   CLOPIDOGREL (PLAVIX) 75 MG TABLET    TAKE ONE TABLET BY MOUTH EVERY DAY   FAMOTIDINE (PEPCID) 20 MG TABLET    Take 20 mg by mouth 2 (two) times daily.   GLIPIZIDE (GLUCOTROL) 10 MG TABLET    Take 2 tablets (20 mg total) by mouth daily.   ISOSORBIDE MONONITRATE (IMDUR) 30 MG 24 HR TABLET    Take 1 tablet (30 mg total) by mouth daily.   LISINOPRIL (PRINIVIL,ZESTRIL) 10 MG TABLET    Take 1 tablet (10 mg total) by mouth daily.   METFORMIN (GLUCOPHAGE) 1000 MG TABLET    Take 1 tablet (1,000 mg total) by mouth 2 (two) times daily with a meal.   METOPROLOL TARTRATE (LOPRESSOR) 25 MG TABLET    Take 1 tablet (25 mg total) by mouth every morning.   MOMETASONE (ELOCON) 0.1 % OINTMENT    Apply topically daily. To replace Clobetasol.   SIMVASTATIN (ZOCOR) 40 MG TABLET    Take 1 tablet (40 mg total) by mouth daily.    Review of Systems  All other systems reviewed and are negative.   Social History  Substance Use Topics  . Smoking status: Former Smoker    Quit date: 12/07/2013  . Smokeless tobacco: Not on file  . Alcohol use 0.0 oz/week     Comment:  wine on the weekends   Objective:   BP 136/73 (BP Location: Left Arm)   Temp 98.2 F (36.8 C)   Ht 5\' 3"  (1.6 m)   Wt 161 lb 9.6 oz (73.3 kg)   BMI 28.63 kg/m   Physical Exam   Constitutional: She is oriented to person, place, and time. She appears well-developed and well-nourished.  HENT:  Head: Normocephalic and atraumatic.  Eyes: Pupils are equal, round, and reactive to light.  Neck: Normal range of motion. Neck supple. No thyromegaly present.  Cardiovascular: Normal rate, regular rhythm and normal heart sounds.   Pulmonary/Chest: Effort normal and breath sounds normal.  Abdominal: Soft. Bowel sounds are normal.  Neurological: She is alert and oriented to person, place, and time.  Skin: Skin is warm and dry.  Vitals reviewed.       Assessment & Plan:         HLD:  Continue current regimen.  Encourage low cholesterol, low fat diet and exercise.   DM: .  Check fasting sugars bring log to next ov.  Encourage diabetic diet and exercise.  Continue current medication regimen.   HTN:  Controlled. .  Goal BP <140/80.  Continue current medication regimen.  Encourage low salt diet and exercise.   GERD:  Well controlled with pepcid.  Avoid triggers.   Routine labs ordered.  Staci Acosta, NP   [facility]

## 2017-05-09 LAB — CBC WITH DIFFERENTIAL/PLATELET
Basophils Absolute: 0 10*3/uL (ref 0.0–0.2)
Basos: 0 %
EOS (ABSOLUTE): 0.2 10*3/uL (ref 0.0–0.4)
Eos: 1 %
Hematocrit: 46.8 % — ABNORMAL HIGH (ref 34.0–46.6)
Hemoglobin: 15.2 g/dL (ref 11.1–15.9)
Immature Grans (Abs): 0 10*3/uL (ref 0.0–0.1)
Immature Granulocytes: 0 %
LYMPHS ABS: 6.6 10*3/uL — AB (ref 0.7–3.1)
LYMPHS: 36 %
MCH: 28.9 pg (ref 26.6–33.0)
MCHC: 32.5 g/dL (ref 31.5–35.7)
MCV: 89 fL (ref 79–97)
Monocytes Absolute: 0.5 10*3/uL (ref 0.1–0.9)
Monocytes: 3 %
NEUTROS ABS: 11.1 10*3/uL — AB (ref 1.4–7.0)
Neutrophils: 60 %
PLATELETS: 310 10*3/uL (ref 150–379)
RBC: 5.26 x10E6/uL (ref 3.77–5.28)
RDW: 13.1 % (ref 12.3–15.4)
WBC: 18.5 10*3/uL — AB (ref 3.4–10.8)

## 2017-05-09 LAB — MICROALBUMIN / CREATININE URINE RATIO
CREATININE, UR: 177 mg/dL
MICROALBUM., U, RANDOM: 1807.5 ug/mL
Microalb/Creat Ratio: 1021.2 mg/g creat — ABNORMAL HIGH (ref 0.0–30.0)

## 2017-05-09 LAB — HEMOGLOBIN A1C
Est. average glucose Bld gHb Est-mCnc: 283 mg/dL
HEMOGLOBIN A1C: 11.5 % — AB (ref 4.8–5.6)

## 2017-05-09 LAB — COMPREHENSIVE METABOLIC PANEL
ALK PHOS: 73 IU/L (ref 39–117)
ALT: 15 IU/L (ref 0–32)
AST: 16 IU/L (ref 0–40)
Albumin/Globulin Ratio: 1.2 (ref 1.2–2.2)
Albumin: 4.2 g/dL (ref 3.5–5.5)
BUN/Creatinine Ratio: 23 (ref 9–23)
BUN: 17 mg/dL (ref 6–24)
Bilirubin Total: 0.2 mg/dL (ref 0.0–1.2)
CALCIUM: 9.6 mg/dL (ref 8.7–10.2)
CO2: 22 mmol/L (ref 20–29)
CREATININE: 0.73 mg/dL (ref 0.57–1.00)
Chloride: 101 mmol/L (ref 96–106)
GFR calc Af Amer: 108 mL/min/{1.73_m2} (ref >59)
GFR, EST NON AFRICAN AMERICAN: 94 mL/min/{1.73_m2} (ref >59)
GLOBULIN, TOTAL: 3.5 g/dL (ref 1.5–4.5)
Glucose: 264 mg/dL — ABNORMAL HIGH (ref 65–99)
POTASSIUM: 4.6 mmol/L (ref 3.5–5.2)
SODIUM: 139 mmol/L (ref 134–144)
Total Protein: 7.7 g/dL (ref 6.0–8.5)

## 2017-05-09 LAB — TSH: TSH: 1.59 u[IU]/mL (ref 0.450–4.500)

## 2017-05-09 LAB — LIPID PANEL
CHOL/HDL RATIO: 6 ratio — AB (ref 0.0–4.4)
Cholesterol, Total: 265 mg/dL — ABNORMAL HIGH (ref 100–199)
HDL: 44 mg/dL (ref >39)
LDL CALC: 170 mg/dL — AB (ref 0–99)
Triglycerides: 253 mg/dL — ABNORMAL HIGH (ref 0–149)
VLDL Cholesterol Cal: 51 mg/dL — ABNORMAL HIGH (ref 5–40)

## 2017-07-03 ENCOUNTER — Telehealth: Payer: Self-pay | Admitting: Pharmacy Technician

## 2017-07-03 NOTE — Telephone Encounter (Signed)
Patient failed to provide 2018 poi.  No additional medication assistance will be provided by [facility] without the required proof of income documentation.  Patient notified by letter.  Betty J. Kluttz Care Manager [facility] 

## 2017-08-14 ENCOUNTER — Ambulatory Visit: Payer: Self-pay | Admitting: Urology

## 2017-08-14 VITALS — BP 145/73 | HR 84 | Temp 98.2°F | Wt 162.5 lb

## 2017-08-14 DIAGNOSIS — I10 Essential (primary) hypertension: Secondary | ICD-10-CM

## 2017-08-14 DIAGNOSIS — E119 Type 2 diabetes mellitus without complications: Secondary | ICD-10-CM

## 2017-08-14 LAB — GLUCOSE, POCT (MANUAL RESULT ENTRY): POC GLUCOSE: 232 mg/dL — AB (ref 70–99)

## 2017-08-14 MED ORDER — MOMETASONE FUROATE 0.1 % EX OINT: TOPICAL_OINTMENT | Freq: Every day | CUTANEOUS | 3 refills | Status: DC

## 2017-08-14 MED ORDER — LISINOPRIL 20 MG PO TABS: 20.0000 mg | ORAL_TABLET | Freq: Every day | ORAL | 0 refills | Status: DC

## 2017-08-14 NOTE — Progress Notes (Signed)
Patient: Laurie Kirby Female    DOB: 10-31-1960   56 y.o.   MRN: [id number] Visit Date: 08/14/2017  Today's Provider: [facility]   Chief Complaint  Patient presents with  . Follow-up   Subjective:    HPI POCT Glucose tonight 232 mg/dl.   She ate dinner at 3 pm which included strawberry cake.   Went over July labs - TSH normal, lipids elevated, urine microalbuminuria is elevated, WBC is elevated, HbgA1c is elevated and CMP is normal  BS at home 190 on average - not consistently checking  Taking cholesterol pill in the morning Pt is taking baby Aspirin. Pt states that she is not monitoring her diet or exercising.  States she is taking her medications Denies CP, HA or dizziness.      No Known Allergies Previous Medications   ASPIRIN 81 MG TABLET    Take 81 mg by mouth daily.   CLOPIDOGREL (PLAVIX) 75 MG TABLET    Take 1 tablet (75 mg total) by mouth daily.   FAMOTIDINE (PEPCID) 20 MG TABLET    Take 20 mg by mouth 2 (two) times daily.   GLIPIZIDE (GLUCOTROL) 10 MG TABLET    Take 2 tablets (20 mg total) by mouth daily before breakfast.   ISOSORBIDE MONONITRATE (IMDUR) 30 MG 24 HR TABLET    Take 1 tablet (30 mg total) by mouth daily.   METFORMIN (GLUCOPHAGE) 1000 MG TABLET    Take 1 tablet (1,000 mg total) by mouth 2 (two) times daily with a meal.   METOPROLOL TARTRATE (LOPRESSOR) 25 MG TABLET    Take 1 tablet (25 mg total) by mouth every morning.   SIMVASTATIN (ZOCOR) 40 MG TABLET    Take 1 tablet (40 mg total) by mouth daily.    Review of Systems  All other systems reviewed and are negative.   Social History  Substance Use Topics  . Smoking status: Former Smoker    Quit date: 12/07/2013  . Smokeless tobacco: Not on file  . Alcohol use 0.0 oz/week     Comment:  wine on the weekends   Objective:   BP (!) 145/73   Pulse 84   Temp 98.2 F (36.8 C)   Wt 162 lb 8 oz (73.7 kg)   BMI 28.79 kg/m   Physical Exam  Constitutional: She is oriented to person,  place, and time. She appears well-developed and well-nourished.  HENT:  Head: Normocephalic and atraumatic.  Eyes: Pupils are equal, round, and reactive to light.  Neck: Normal range of motion. Neck supple. No thyromegaly present.  Cardiovascular: Normal rate, regular rhythm and normal heart sounds.   Pulmonary/Chest: Effort normal and breath sounds normal.  Abdominal: Soft. Bowel sounds are normal.  Neurological: She is alert and oriented to person, place, and time.  Skin: Skin is warm and dry.  Vitals reviewed.       Assessment & Plan:         HLD:  Continue current regimen - advised to take simvastatin qhs, she has been taking in the am Encourage low cholesterol, low fat diet and exercise.   DM: .  Check fasting sugars bring log to next ov.  Encourage diabetic diet and exercise.  Continue current medication regimen.  Check hbg A1c today  HTN:  Uncontrolled - increase lisinopril 20 mg daily Goal BP <140/80.  Continue current medication regimen.  Encourage low salt diet and exercise.   GERD:  Well controlled with pepcid.  Avoid triggers.   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

## 2017-08-15 LAB — CBC WITH DIFFERENTIAL/PLATELET
BASOS: 0 %
Basophils Absolute: 0.1 10*3/uL (ref 0.0–0.2)
EOS (ABSOLUTE): 0.2 10*3/uL (ref 0.0–0.4)
EOS: 1 %
HEMATOCRIT: 45.3 % (ref 34.0–46.6)
HEMOGLOBIN: 15.6 g/dL (ref 11.1–15.9)
IMMATURE GRANS (ABS): 0 10*3/uL (ref 0.0–0.1)
Immature Granulocytes: 0 %
LYMPHS: 36 %
Lymphocytes Absolute: 5.8 10*3/uL — ABNORMAL HIGH (ref 0.7–3.1)
MCH: 30.6 pg (ref 26.6–33.0)
MCHC: 34.4 g/dL (ref 31.5–35.7)
MCV: 89 fL (ref 79–97)
MONOCYTES: 3 %
Monocytes Absolute: 0.5 10*3/uL (ref 0.1–0.9)
NEUTROS ABS: 9.5 10*3/uL — AB (ref 1.4–7.0)
Neutrophils: 60 %
Platelets: 276 10*3/uL (ref 150–379)
RBC: 5.09 x10E6/uL (ref 3.77–5.28)
RDW: 13.3 % (ref 12.3–15.4)
WBC: 16.1 10*3/uL — ABNORMAL HIGH (ref 3.4–10.8)

## 2017-08-15 LAB — HEMOGLOBIN A1C
Est. average glucose Bld gHb Est-mCnc: 272 mg/dL
Hgb A1c MFr Bld: 11.1 % — ABNORMAL HIGH (ref 4.8–5.6)

## 2017-08-28 ENCOUNTER — Ambulatory Visit: Payer: Self-pay | Admitting: Family Medicine

## 2017-08-28 ENCOUNTER — Other Ambulatory Visit: Payer: Self-pay

## 2017-08-28 VITALS — BP 142/85 | HR 91 | Temp 98.4°F | Wt 162.1 lb

## 2017-08-28 DIAGNOSIS — Z720 Tobacco use: Secondary | ICD-10-CM

## 2017-08-28 DIAGNOSIS — E785 Hyperlipidemia, unspecified: Secondary | ICD-10-CM

## 2017-08-28 DIAGNOSIS — R011 Cardiac murmur, unspecified: Secondary | ICD-10-CM

## 2017-08-28 DIAGNOSIS — E119 Type 2 diabetes mellitus without complications: Secondary | ICD-10-CM

## 2017-08-28 DIAGNOSIS — I25119 Atherosclerotic heart disease of native coronary artery with unspecified angina pectoris: Secondary | ICD-10-CM

## 2017-08-28 DIAGNOSIS — D729 Disorder of white blood cells, unspecified: Secondary | ICD-10-CM

## 2017-08-28 LAB — GLUCOSE, POCT (MANUAL RESULT ENTRY): POC Glucose: 274 mg/dl — AB (ref 70–99)

## 2017-08-28 MED ORDER — DAPAGLIFLOZIN PROPANEDIOL 5 MG PO TABS: 5.0000 mg | ORAL_TABLET | Freq: Every day | ORAL | 1 refills | Status: DC

## 2017-08-28 MED ORDER — ATORVASTATIN CALCIUM 40 MG PO TABS: 40.0000 mg | ORAL_TABLET | Freq: Every day | ORAL | 0 refills | Status: DC

## 2017-08-28 MED ORDER — ATORVASTATIN CALCIUM 40 MG PO TABS: 40.0000 mg | ORAL_TABLET | Freq: Every day | ORAL | 1 refills | Status: DC

## 2017-08-28 MED ORDER — METOPROLOL TARTRATE 25 MG PO TABS: 25.0000 mg | ORAL_TABLET | Freq: Every morning | ORAL | 1 refills | Status: DC

## 2017-08-28 NOTE — Assessment & Plan Note (Addendum)
Refer to hematologist; smoking cessation is important; explained that I am not diagnosing her with leukemia but a chronic leukemia is in the ddx, given that she is a smoker; she'll see hematologist for further testing

## 2017-08-28 NOTE — Patient Instructions (Addendum)
I am Dr. Baruch Gouty at [facility] have you see the hematologist about your Burditt blood cell count Stop the simvastatin Start the atorvastatin Continue the other diabetes medicines Add Farxiga Diabetes Mellitus and Food It is important for you to manage your blood sugar (glucose) level. Your blood glucose level can be greatly affected by what you eat. Eating healthier foods in the appropriate amounts throughout the day at about the same time each day will help you control your blood glucose level. It can also help slow or prevent worsening of your diabetes mellitus. Healthy eating may even help you improve the level of your blood pressure and reach or maintain a healthy weight. General recommendations for healthful eating and cooking habits include:  Eating meals and snacks regularly. Avoid going long periods of time without eating to lose weight.  Eating a diet that consists mainly of plant-based foods, such as fruits, vegetables, nuts, legumes, and whole grains.  Using low-heat cooking methods, such as baking, instead of high-heat cooking methods, such as deep frying.  Work with your dietitian to make sure you understand how to use the Nutrition Facts information on food labels. How can food affect me? Carbohydrates Carbohydrates affect your blood glucose level more than any other type of food. Your dietitian will help you determine how many carbohydrates to eat at each meal and teach you how to count carbohydrates. Counting carbohydrates is important to keep your blood glucose at a healthy level, especially if you are using insulin or taking certain medicines for diabetes mellitus. Alcohol Alcohol can cause sudden decreases in blood glucose (hypoglycemia), especially if you use insulin or take certain medicines for diabetes mellitus. Hypoglycemia can be a life-threatening condition. Symptoms of hypoglycemia (sleepiness, dizziness, and disorientation) are similar to symptoms of having  too much alcohol. If your health care provider has given you approval to drink alcohol, do so in moderation and use the following guidelines:  Women should not have more than one drink per day, and men should not have more than two drinks per day. One drink is equal to: ? 12 oz of beer. ? 5 oz of wine. ? 1 oz of hard liquor.  Do not drink on an empty stomach.  Keep yourself hydrated. Have water, diet soda, or unsweetened iced tea.  Regular soda, juice, and other mixers might contain a lot of carbohydrates and should be counted.  What foods are not recommended? As you make food choices, it is important to remember that all foods are not the same. Some foods have fewer nutrients per serving than other foods, even though they might have the same number of calories or carbohydrates. It is difficult to get your body what it needs when you eat foods with fewer nutrients. Examples of foods that you should avoid that are high in calories and carbohydrates but low in nutrients include:  Trans fats (most processed foods list trans fats on the Nutrition Facts label).  Regular soda.  Juice.  Candy.  Sweets, such as cake, pie, doughnuts, and cookies.  Fried foods.  What foods can I eat? Eat nutrient-rich foods, which will nourish your body and keep you healthy. The food you should eat also will depend on several factors, including:  The calories you need.  The medicines you take.  Your weight.  Your blood glucose level.  Your blood pressure level.  Your cholesterol level.  You should eat a variety of foods, including:  Protein. ? Lean cuts of meat. ? Proteins  low in saturated fats, such as fish, egg whites, and beans. Avoid processed meats.  Fruits and vegetables. ? Fruits and vegetables that may help control blood glucose levels, such as apples, mangoes, and yams.  Dairy products. ? Choose fat-free or low-fat dairy products, such as milk, yogurt, and cheese.  Grains,  bread, pasta, and rice. ? Choose whole grain products, such as multigrain bread, whole oats, and brown rice. These foods may help control blood pressure.  Fats. ? Foods containing healthful fats, such as nuts, avocado, olive oil, canola oil, and fish.  Does everyone with diabetes mellitus have the same meal plan? Because every person with diabetes mellitus is different, there is not one meal plan that works for everyone. It is very important that you meet with a dietitian who will help you create a meal plan that is just right for you. This information is not intended to replace advice given to you by your health care provider. Make sure you discuss any questions you have with your health care provider. Document Released: 07/11/2005 Document Revised: 03/21/2016 Document Reviewed: 09/10/2013 Elsevier Interactive Patient Education  2017 [employer]

## 2017-08-28 NOTE — Progress Notes (Signed)
BP (!) 142/85   Pulse 91   Temp 98.4 F (36.9 C)   Wt 162 lb 1.6 oz (73.5 kg)   BMI 28.71 kg/m    Subjective:    Patient ID: Laurie Kirby, female    DOB: 1961-07-20, 56 y.o.   MRN: [id number]  HPI: Laurie LipaBeverly Hilario is a 56 y.o. female  Chief Complaint  Patient presents with  . Follow-up    HPI Patient has had some type of infection she says and is here because of abnormal labs Carollee HerterShannon saw her two weeks ago and there was a CBC done We reviewed that tonight, along with her other previous CBCs This is not new  Glucose 274 tonight; she has type 2 diabetes She barely eats bread, Cribb wheat; she eats rice, [place]; few aunts had birthdays recently so she had some cake  She smokes on the weekends  No flowsheet data found.  Relevant past medical, surgical, family and social history reviewed Past Medical History:  Diagnosis Date  . Diabetes mellitus without complication (HCC)   . Hyperlipidemia    Past Surgical History:  Procedure Laterality Date  . CORONARY ANGIOPLASTY WITH STENT PLACEMENT     Family History  Problem Relation Age of Onset  . Diabetes type II Mother   . Diabetes type II Father   . Heart disease Father   . Breast cancer Maternal Aunt    Social History   Socioeconomic History  . Marital status: Single    Spouse name: Not on file  . Number of children: Not on file  . Years of education: Not on file  . Highest education level: Not on file  Social Needs  . Financial resource strain: Not on file  . Food insecurity - worry: Not on file  . Food insecurity - inability: Not on file  . Transportation needs - medical: Not on file  . Transportation needs - non-medical: Not on file  Occupational History  . Not on file  Tobacco Use  . Smoking status: Former Smoker    Last attempt to quit: 12/07/2013    Years since quitting: 3.7  Substance and Sexual Activity  . Alcohol use: Yes    Alcohol/week: 2.4 oz    Types: 4 Glasses of wine per week    Comment:   wine on the weekends  . Drug use: No  . Sexual activity: Not on file  Other Topics Concern  . Not on file  Social History Narrative  . Not on file   Interim medical history since last visit reviewed. Allergies and medications reviewed  Review of Systems Per HPI unless specifically indicated above     Objective:    BP (!) 142/85   Pulse 91   Temp 98.4 F (36.9 C)   Wt 162 lb 1.6 oz (73.5 kg)   BMI 28.71 kg/m   Wt Readings from Last 3 Encounters:  08/28/17 162 lb 1.6 oz (73.5 kg)  08/14/17 162 lb 8 oz (73.7 kg)  05/08/17 161 lb 9.6 oz (73.3 kg)    Physical Exam  Constitutional: She appears well-developed and well-nourished.  HENT:  Mouth/Throat: Mucous membranes are normal.  Eyes: EOM are normal. No scleral icterus.  Cardiovascular: Normal rate and regular rhythm.  Murmur heard. Pulmonary/Chest: Effort normal and breath sounds normal.  Psychiatric: She has a normal mood and affect. Her behavior is normal.   Results for orders placed or performed in visit on 08/28/17  POCT Glucose (CBG)  Result Value Ref Range  POC Glucose 274 (A) 70 - 99 mg/dl      Assessment & Plan:   Problem List Items Addressed This Visit      Other   Tobacco abuse    Encouraged patient to consider habits leading to smoking on the weekends; smoking cessation is so important      Abnormal Braggs blood cell (WBC) - Primary    Refer to hematologist; smoking cessation is important; explained that I am not diagnosing her with leukemia but a chronic leukemia is in the ddx, given that she is a smoker; she'll see hematologist for further testing      Relevant Orders   Ambulatory referral to Hematology    Other Visit Diagnoses    Diabetes mellitus without complication (HCC)       Relevant Medications   atorvastatin (LIPITOR) 40 MG tablet   Other Relevant Orders   POCT Glucose (CBG) (Completed)   Cardiac murmur       order echocardiogram to evaluate for valvular pathology   Relevant Orders    ECHOCARDIOGRAM COMPLETE      Follow up plan: Return in about 3 months (around 11/28/2017) for twenty minute follow-up with fasting labs one week before.  An after-visit summary was printed and given to the patient at check-out.  Please see the patient instructions which may contain other information and recommendations beyond what is mentioned above in the assessment and plan.  Meds ordered this encounter  Medications  . DISCONTD: dapagliflozin propanediol (FARXIGA) 5 MG TABS tablet    Sig: Take 5 mg by mouth daily. For diabetes    Dispense:  90 tablet    Refill:  1  . DISCONTD: atorvastatin (LIPITOR) 40 MG tablet    Sig: Take 1 tablet (40 mg total) by mouth at bedtime. For cholesterol; this replaces simvastatin    Dispense:  90 tablet    Refill:  0  . DISCONTD: atorvastatin (LIPITOR) 40 MG tablet    Sig: Take 1 tablet (40 mg total) by mouth daily.    Dispense:  90 tablet    Refill:  1  . atorvastatin (LIPITOR) 40 MG tablet    Sig: Take 1 tablet (40 mg total) by mouth daily.    Dispense:  90 tablet    Refill:  0    Orders Placed This Encounter  Procedures  . Ambulatory referral to Hematology  . POCT Glucose (CBG)  . ECHOCARDIOGRAM COMPLETE

## 2017-08-28 NOTE — Telephone Encounter (Signed)
Patient mentioned upon checkout that this medication was [facility] ago [facility] [place]  [facility]  [facility]

## 2017-08-29 ENCOUNTER — Telehealth: Payer: Self-pay

## 2017-08-29 DIAGNOSIS — E119 Type 2 diabetes mellitus without complications: Secondary | ICD-10-CM

## 2017-08-29 MED ORDER — DAPAGLIFLOZIN PROPANEDIOL 5 MG PO TABS: 5.0000 mg | ORAL_TABLET | Freq: Every day | ORAL | 1 refills | Status: DC

## 2017-08-29 MED ORDER — ATORVASTATIN CALCIUM 40 MG PO TABS: 40.0000 mg | ORAL_TABLET | Freq: Every day | ORAL | 0 refills | Status: DC

## 2017-08-29 NOTE — Telephone Encounter (Signed)
[facility] this am with concerns on pt's bp. Pt was to have 3 teeth pulled. Her bp was 171/121. They rescheduled her appt for Dec 14th and [facility]. I called pt and verified she is not having any symptoms of a possible heart attack/stroke. She explained she feels fine and is even not in any pain. I explained the signs to look out for and if she started to show any to head to the ED. PT verbalized understanding. Pt also wanted me to send [facility] I also made her an appt for tues night to discuss the BP.

## 2017-09-02 ENCOUNTER — Ambulatory Visit: Payer: Self-pay

## 2017-09-03 DIAGNOSIS — Z720 Tobacco use: Secondary | ICD-10-CM | POA: Insufficient documentation

## 2017-09-03 NOTE — Assessment & Plan Note (Signed)
Encouraged patient to consider habits leading to smoking on the weekends; smoking cessation is so important

## 2017-09-04 ENCOUNTER — Ambulatory Visit: Payer: Self-pay | Admitting: Urology

## 2017-09-04 VITALS — BP 166/86 | HR 67 | Temp 98.7°F | Ht 63.0 in | Wt 168.3 lb

## 2017-09-04 DIAGNOSIS — E119 Type 2 diabetes mellitus without complications: Secondary | ICD-10-CM

## 2017-09-04 LAB — GLUCOSE, POCT (MANUAL RESULT ENTRY): POC GLUCOSE: 187 mg/dL — AB (ref 70–99)

## 2017-09-04 NOTE — Progress Notes (Signed)
  Patient: Laurie Kirby Hancher Female    DOB: Jul 13, 1961   56 y.o.   MRN: [id number] Visit Date: 09/04/2017  Today's Provider: [facility]   Chief Complaint  Patient presents with  . Follow-up   Subjective:    Laurie Kirby Pautz is a 56 y/o woman with HTN, HLD, DM here for BP f/u   HTN Per patient misses 3 days of BP meds/week. Eats "junk" for most meals, endorses lots of pizza. Does "not do exercise because it's too cold." Pt needs BP down before dentist appointment in December.        No Known Allergies This SmartLink is deprecated. Use AVSMEDLIST instead to display the medication list for a patient.  Review of Systems  All other systems reviewed and are negative.   Social History   Tobacco Use  . Smoking status: Former Smoker    Last attempt to quit: 12/07/2013    Years since quitting: 3.7  Substance Use Topics  . Alcohol use: Yes    Alcohol/week: 2.4 oz    Types: 4 Glasses of wine per week    Comment:  wine on the weekends   Objective:   BP (!) 166/86 (BP Location: Left Arm, Patient Position: Sitting, Cuff Size: Normal)   Pulse 67   Temp 98.7 F (37.1 C)   Ht 5\' 3"  (1.6 m)   Wt 168 lb 4.8 oz (76.3 kg)   LMP  (Approximate) Comment: LMP in 40s  BMI 29.81 kg/m   Physical Exam  Constitutional: She is oriented to person, place, and time. She appears well-developed and well-nourished.  HENT:  Head: Normocephalic and atraumatic.  Eyes: Conjunctivae are normal.  Cardiovascular: Normal rate, regular rhythm and normal heart sounds.  Pulmonary/Chest: Effort normal and breath sounds normal.  Neurological: She is oriented to person, place, and time.  Skin: Skin is warm and dry.  Psychiatric: She has a normal mood and affect.        Assessment & Plan:     Laurie Kirby Harden is a 56 y/o woman with HTN, HLD, DM here for BP f/u  HTN BP elevated today. Pt not adherent to meds. Pt with poor diet/exercise. Encouraged patient to take meds, pt verbalizes understanding and  agreement. Pt to work on healthy diet changes. Pt refuses to start exercise regimen. F/u in 3 weeks for BP recheck      [facility]   [facility]

## 2017-09-25 ENCOUNTER — Ambulatory Visit: Payer: Self-pay | Admitting: Urology

## 2017-09-25 VITALS — BP 146/83 | HR 76 | Temp 98.1°F | Ht 63.0 in | Wt 163.5 lb

## 2017-09-25 DIAGNOSIS — I10 Essential (primary) hypertension: Secondary | ICD-10-CM

## 2017-09-25 NOTE — Progress Notes (Signed)
  Patient: Laurie Kirby Female    DOB: 1961/01/04   56 y.o.   MRN: [id number] Visit Date: 09/25/2017  Today's Provider: [facility]   No chief complaint on file.  Subjective:    Laurie Kirby is a 56 y/o with HTN, DM, tobacco use here for BP check  HTN Not adherent to >50% of medications. Has been on "sugar kick." Plans to work on diet over next few months. "Will try to take medications." No HA. No chest pain. No SOB  DM Not adherent to >50% of medications. Has been on "sugar kick." Plans to work on diet over next few months. "Will try to take medications." No HA. No chest pain. No SOB  Tobacco use Has not thought about quitting. Does not plan to quit.        No Known Allergies This SmartLink is deprecated. Use AVSMEDLIST instead to display the medication list for a patient.  Review of Systems  Social History   Tobacco Use  . Smoking status: Former Smoker    Last attempt to quit: 12/07/2013    Years since quitting: 3.8  Substance Use Topics  . Alcohol use: Yes    Alcohol/week: 2.4 oz    Types: 4 Glasses of wine per week    Comment:  wine on the weekends   Objective:   BP (!) 146/83 (BP Location: Right Arm, Patient Position: Sitting, Cuff Size: Normal)   Pulse 76   Temp 98.1 F (36.7 C)   Ht 5\' 3"  (1.6 m)   Wt 163 lb 8 oz (74.2 kg)   BMI 28.96 kg/m   Physical Exam  Constitutional: She is oriented to person, place, and time. She appears well-developed and well-nourished.  HENT:  Head: Normocephalic and atraumatic.  Eyes: Conjunctivae are normal.  Cardiovascular: Normal rate, regular rhythm and normal heart sounds.  Pulmonary/Chest: Effort normal and breath sounds normal.  Neurological: She is alert and oriented to person, place, and time.  Skin: Skin is warm and dry.  Psychiatric: She has a normal mood and affect.        Assessment & Plan:     Laurie Kirby is a 56 y/o with HTN, DM, tobacco use here for BP check  HTN BP still elevated  today. Pt not adherent to meds. Diet/exercise still poor. Counselled patient on taking meds and good exercise/diet regimen. Follow-up in 3 months  Uncontrolled DM A1c at last visit 11.1. Pt not adherent to meds. Diet/exercise still poor. Counselled patient on taking meds and good exercise/diet regimen. Follow-up in 3 months  Smoking Pt unwilling to stop smoking. Counselled patient on benefits of cessation.        [facility]   [facility]

## 2017-09-29 ENCOUNTER — Ambulatory Visit: Payer: Self-pay | Admitting: Oncology

## 2017-10-02 ENCOUNTER — Inpatient Hospital Stay: Payer: Self-pay | Attending: Oncology | Admitting: Oncology

## 2017-10-02 ENCOUNTER — Encounter: Payer: Self-pay | Admitting: Oncology

## 2017-10-02 ENCOUNTER — Inpatient Hospital Stay: Payer: Self-pay

## 2017-10-02 VITALS — BP 138/87 | HR 83 | Temp 98.4°F | Resp 18 | Ht 63.0 in | Wt 161.1 lb

## 2017-10-02 DIAGNOSIS — Z7984 Long term (current) use of oral hypoglycemic drugs: Secondary | ICD-10-CM | POA: Insufficient documentation

## 2017-10-02 DIAGNOSIS — Z87891 Personal history of nicotine dependence: Secondary | ICD-10-CM | POA: Insufficient documentation

## 2017-10-02 DIAGNOSIS — E785 Hyperlipidemia, unspecified: Secondary | ICD-10-CM | POA: Insufficient documentation

## 2017-10-02 DIAGNOSIS — D72820 Lymphocytosis (symptomatic): Secondary | ICD-10-CM

## 2017-10-02 DIAGNOSIS — D72829 Elevated white blood cell count, unspecified: Secondary | ICD-10-CM | POA: Insufficient documentation

## 2017-10-02 DIAGNOSIS — R21 Rash and other nonspecific skin eruption: Secondary | ICD-10-CM | POA: Insufficient documentation

## 2017-10-02 DIAGNOSIS — I2510 Atherosclerotic heart disease of native coronary artery without angina pectoris: Secondary | ICD-10-CM | POA: Insufficient documentation

## 2017-10-02 DIAGNOSIS — Z803 Family history of malignant neoplasm of breast: Secondary | ICD-10-CM | POA: Insufficient documentation

## 2017-10-02 DIAGNOSIS — D729 Disorder of white blood cells, unspecified: Secondary | ICD-10-CM

## 2017-10-02 DIAGNOSIS — Z79899 Other long term (current) drug therapy: Secondary | ICD-10-CM | POA: Insufficient documentation

## 2017-10-02 DIAGNOSIS — I10 Essential (primary) hypertension: Secondary | ICD-10-CM | POA: Insufficient documentation

## 2017-10-02 DIAGNOSIS — E119 Type 2 diabetes mellitus without complications: Secondary | ICD-10-CM | POA: Insufficient documentation

## 2017-10-02 DIAGNOSIS — D72828 Other elevated white blood cell count: Secondary | ICD-10-CM

## 2017-10-02 DIAGNOSIS — D72824 Basophilia: Secondary | ICD-10-CM | POA: Insufficient documentation

## 2017-10-02 LAB — COMPREHENSIVE METABOLIC PANEL
ALBUMIN: 4 g/dL (ref 3.5–5.0)
ALK PHOS: 60 U/L (ref 38–126)
ALT: 13 U/L — AB (ref 14–54)
AST: 16 U/L (ref 15–41)
Anion gap: 7 (ref 5–15)
BILIRUBIN TOTAL: 0.4 mg/dL (ref 0.3–1.2)
BUN: 12 mg/dL (ref 6–20)
CALCIUM: 9.5 mg/dL (ref 8.9–10.3)
CO2: 28 mmol/L (ref 22–32)
CREATININE: 0.51 mg/dL (ref 0.44–1.00)
Chloride: 103 mmol/L (ref 101–111)
GFR calc Af Amer: >60 mL/min (ref >60)
GLUCOSE: 254 mg/dL — AB (ref 65–99)
Potassium: 4.2 mmol/L (ref 3.5–5.1)
Sodium: 138 mmol/L (ref 135–145)
TOTAL PROTEIN: 8.2 g/dL — AB (ref 6.5–8.1)

## 2017-10-02 LAB — CBC WITH DIFFERENTIAL/PLATELET
BASOS ABS: 0.3 10*3/uL — AB (ref 0–0.1)
BASOS PCT: 2 %
EOS PCT: 1 %
Eosinophils Absolute: 0.2 10*3/uL (ref 0–0.7)
HEMATOCRIT: 46.2 % (ref 35.0–47.0)
Hemoglobin: 15.2 g/dL (ref 12.0–16.0)
Lymphocytes Relative: 33 %
Lymphs Abs: 5.3 10*3/uL — ABNORMAL HIGH (ref 1.0–3.6)
MCH: 29.4 pg (ref 26.0–34.0)
MCHC: 32.9 g/dL (ref 32.0–36.0)
MCV: 89.2 fL (ref 80.0–100.0)
MONO ABS: 0.7 10*3/uL (ref 0.2–0.9)
MONOS PCT: 4 %
Neutro Abs: 9.6 10*3/uL — ABNORMAL HIGH (ref 1.4–6.5)
Neutrophils Relative %: 60 %
PLATELETS: 262 10*3/uL (ref 150–440)
RBC: 5.18 MIL/uL (ref 3.80–5.20)
RDW: 12.7 % (ref 11.5–14.5)
WBC: 16 10*3/uL — ABNORMAL HIGH (ref 3.6–11.0)

## 2017-10-02 LAB — TECHNOLOGIST SMEAR REVIEW: TECH REVIEW: ADEQUATE

## 2017-10-02 NOTE — Progress Notes (Signed)
Hematology/Oncology Consult note [facility] [phone] Fax:[phone]  Patient Care Team: Patient, No Pcp Per as PCP - General (General Practice) Jim LikeLambert, Sheena M, RN as Registered Nurse Scarlett PrestoShaver, Anne F, RN as Registered Nurse   Name of the patient: Laurie LipaBeverly Kirby  [id number]  04-Jun-1961    Reason for referral- leukocytosis   [facility]  Date of visit: 10/02/17   History of presenting illness- Patient is a 56 year old African-American female who has been referred to us for leukocytosis.  She reports doing well and denies any complaints today.  Denies any fatigue, unintentional weight loss or loss of appetite, drenching night sweats or lumps or bumps anywhere.  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  Most recent CBC from 10/02/2017 showed Wenberg count of 16, H&H of 15.2/46.2 and a platelet count of 262.  Differential mainly showed neutrophilia with an absolute neutrophil count of 9.6 and lymphocytosis with an absolute lymphocyte count of 5.3 and mild basophilia.  She denies any recurrent infections  ECOG PS- 0  Pain scale- 0   Review of systems- Review of Systems  Constitutional: Negative for chills, fever, malaise/fatigue and weight loss.  HENT: Negative for congestion, ear discharge and nosebleeds.   Eyes: Negative for blurred vision.  Respiratory: Negative for cough, hemoptysis, sputum production, shortness of breath and wheezing.   Cardiovascular: Negative for chest pain, palpitations, orthopnea and claudication.  Gastrointestinal: Negative for abdominal pain, blood in stool, constipation, diarrhea, heartburn, melena, nausea and vomiting.  Genitourinary: Negative for dysuria, flank pain, frequency, hematuria and urgency.  Musculoskeletal: Negative for back pain, joint pain and myalgias.  Skin: Negative for rash.  Neurological:  Negative for dizziness, tingling, focal weakness, seizures, weakness and headaches.  Endo/Heme/Allergies: Does not bruise/bleed easily.  Psychiatric/Behavioral: Negative for depression and suicidal ideas. The patient does not have insomnia.     No Known Allergies  Patient Active Problem List   Diagnosis Date Noted  . Tobacco abuse 09/03/2017  . Abnormal Stogdill blood cell (WBC) 08/28/2017  . Lichenified rash 01/13/2016  . Hyperlipidemia 01/13/2016  . Abnormal Pap smear of cervix 11/30/2015  . Diabetes (HCC) 07/27/2015  . Coronary atherosclerosis of native coronary artery 07/27/2015  . Post-menopausal bleeding 03/15/2014     Past Medical History:  Diagnosis Date  . Diabetes mellitus without complication (HCC)   . Hyperlipidemia   . Hypertension      Past Surgical History:  Procedure Laterality Date  . CORONARY ANGIOPLASTY WITH STENT PLACEMENT      Social History   Socioeconomic History  . Marital status: Single    Spouse name: Not on file  . Number of children: Not on file  . Years of education: Not on file  . Highest education level: Not on file  Social Needs  . Financial resource strain: Not on file  . Food insecurity - worry: Not on file  . Food insecurity - inability: Not on file  . Transportation needs - medical: Not on file  . Transportation needs - non-medical: Not on file  Occupational History  . Not on file  Tobacco Use  . Smoking status: Former Smoker    Last attempt to quit: 12/07/2013    Years since quitting: 3.8  . Smokeless tobacco: Never Used  Substance and Sexual Activity  . Alcohol use: Yes    Alcohol/week: 2.4 oz    Types: 4 Glasses of wine per week  Comment:  wine on the weekends  . Drug use: No  . Sexual activity: Not on file  Other Topics Concern  . Not on file  Social History Narrative  . Not on file     Family History  Problem Relation Age of Onset  . Diabetes type II Mother   . Diabetes type II Father   . Heart disease  Father   . Cancer Maternal Aunt   . Breast cancer Cousin      Current Outpatient Medications:  .  aspirin 81 MG tablet, Take 81 mg by mouth daily., Disp: , Rfl:  .  atorvastatin (LIPITOR) 40 MG tablet, Take 1 tablet (40 mg total) by mouth daily., Disp: 90 tablet, Rfl: 0 .  clopidogrel (PLAVIX) 75 MG tablet, Take 1 tablet (75 mg total) by mouth daily., Disp: 90 tablet, Rfl: 1 .  dapagliflozin propanediol (FARXIGA) 5 MG TABS tablet, Take 5 mg by mouth daily. For diabetes (Patient taking differently: Take 5 mg by mouth 2 (two) times daily. For diabetes), Disp: 90 tablet, Rfl: 1 .  famotidine (PEPCID) 20 MG tablet, Take 20 mg by mouth 2 (two) times daily., Disp: , Rfl:  .  glipiZIDE (GLUCOTROL) 10 MG tablet, Take 2 tablets (20 mg total) by mouth daily before breakfast., Disp: 180 tablet, Rfl: 1 .  isosorbide mononitrate (IMDUR) 30 MG 24 hr tablet, Take 1 tablet (30 mg total) by mouth daily., Disp: 90 tablet, Rfl: 1 .  lisinopril (PRINIVIL,ZESTRIL) 20 MG tablet, Take 1 tablet (20 mg total) by mouth daily., Disp: 90 tablet, Rfl: 0 .  metFORMIN (GLUCOPHAGE) 1000 MG tablet, Take 1 tablet (1,000 mg total) by mouth 2 (two) times daily with a meal., Disp: 180 tablet, Rfl: 1 .  metoprolol tartrate (LOPRESSOR) 25 MG tablet, Take 1 tablet (25 mg total) by mouth every morning., Disp: 90 tablet, Rfl: 1 .  mometasone (ELOCON) 0.1 % ointment, Apply topically daily., Disp: 45 g, Rfl: 3   Physical exam:  Vitals:   10/02/17 1422  BP: 138/87  Pulse: 83  Resp: 18  Temp: 98.4 F (36.9 C)  TempSrc: Tympanic  Weight: 161 lb 1.6 oz (73.1 kg)  Height: 5\' 3"  (1.6 m)   Physical Exam  Constitutional: She is oriented to person, place, and time and well-developed, well-nourished, and in no distress.  HENT:  Head: Normocephalic and atraumatic.  Eyes: EOM are normal. Pupils are equal, round, and reactive to light.  Neck: Normal range of motion.  Cardiovascular: Normal rate, regular rhythm and normal heart sounds.   Pulmonary/Chest: Effort normal and breath sounds normal.  Abdominal: Soft. Bowel sounds are normal.  No palpable splenomegaly  Lymphadenopathy:  No palpable cervical supraclavicular axillary or inguinal adenopathy  Neurological: She is alert and oriented to person, place, and time.  Skin: Skin is warm and dry.       CMP Latest Ref Rng & Units 05/08/2017  Glucose 65 - 99 mg/dL 161(W)  BUN 6 - 24 mg/dL 17  Creatinine 9.60 - 4.54 mg/dL 0.98  Sodium 119 - 147 mmol/L 139  Potassium 3.5 - 5.2 mmol/L 4.6  Chloride 96 - 106 mmol/L 101  CO2 20 - 29 mmol/L 22  Calcium 8.7 - 10.2 mg/dL 9.6  Total Protein 6.0 - 8.5 g/dL 7.7  Total Bilirubin 0.0 - 1.2 mg/dL 0.2  Alkaline Phos 39 - 117 IU/L 73  AST 0 - 40 IU/L 16  ALT 0 - 32 IU/L 15   CBC Latest Ref Rng & Units  08/14/2017  WBC 3.4 - 10.8 x10E3/uL 16.1(H)  Hemoglobin 11.1 - 15.9 g/dL 78.215.6  Hematocrit 95.634.0 - 46.6 % 45.3  Platelets 150 - 379 x10E3/uL 276    Assessment and plan- Patient is a 56 y.o. female referred for leukocytosis  Leukocytosis has been long long-standing and stable and her Hacking counts fluctuate between 13-18.  Differential mainly shows neutrophilia and lymphocytosis.  She is asymptomatic and does not have any B symptoms or palpable splenomegaly or lymphadenopathy.  Today I will obtain CBC with differential, smear review, peripheral flow cytometry and BCR able testing.  I will see her back in 2 weeks time to discuss the results of her blood work.  Even if patient is found to have CLL she does not have any indication for treatment at this time.   Thank you for this kind referral and the opportunity to participate in the care of this patient   Visit Diagnosis 1. Neutrophilia   2. Lymphocytosis     Dr. Owens SharkArchana Kileigh Ortmann, MD, MPH [facility] Pager- [phone] 10/02/2017 3:56 PM

## 2017-10-07 LAB — COMP PANEL: LEUKEMIA/LYMPHOMA

## 2017-10-10 LAB — BCR-ABL1, CML/ALL, PCR, QUANT

## 2017-10-14 ENCOUNTER — Other Ambulatory Visit: Payer: Self-pay | Admitting: *Deleted

## 2017-10-16 ENCOUNTER — Ambulatory Visit: Payer: Self-pay | Admitting: Oncology

## 2017-10-17 ENCOUNTER — Telehealth: Payer: Self-pay | Admitting: *Deleted

## 2017-10-17 NOTE — Telephone Encounter (Signed)
Dr. Smith Robertao had reviewed all labs.  Dr. Smith Robertao feels that patient has had an increased Roselli count for several years.  She states that it is probably a reactive of her Wilz blood count.  Patient is totally asymptomatic.  Patient had told me on the first day that she came to see us that she would not be coming back because she did not have insurance and [facility] would have [facility].  She wanted me to send the lab work and the recommendations to [facility] so they can monitor or if they needed to make her return then she would.  I have faxed the information with all lab work Dr. Assunta Gamblesao's note and that they probably think it is reactive and she is asymptomatic.  Dr. Smith Robertao recommended that she get her CBC with differential every 6 months and if the Topp count goes from 20-25 she should be referred back to the cancer center.

## 2017-11-27 ENCOUNTER — Other Ambulatory Visit: Payer: Self-pay

## 2017-11-27 DIAGNOSIS — E119 Type 2 diabetes mellitus without complications: Secondary | ICD-10-CM

## 2017-11-27 DIAGNOSIS — E785 Hyperlipidemia, unspecified: Secondary | ICD-10-CM

## 2017-11-28 LAB — LIPID PANEL
Chol/HDL Ratio: 5.5 ratio — ABNORMAL HIGH (ref 0.0–4.4)
Cholesterol, Total: 277 mg/dL — ABNORMAL HIGH (ref 100–199)
HDL: 50 mg/dL (ref >39)
LDL CALC: 202 mg/dL — AB (ref 0–99)
TRIGLYCERIDES: 124 mg/dL (ref 0–149)
VLDL Cholesterol Cal: 25 mg/dL (ref 5–40)

## 2017-11-28 LAB — CBC WITH DIFFERENTIAL
BASOS: 0 %
Basophils Absolute: 0 10*3/uL (ref 0.0–0.2)
EOS (ABSOLUTE): 0.2 10*3/uL (ref 0.0–0.4)
Eos: 1 %
HEMOGLOBIN: 15.3 g/dL (ref 11.1–15.9)
Hematocrit: 46.1 % (ref 34.0–46.6)
IMMATURE GRANS (ABS): 0 10*3/uL (ref 0.0–0.1)
Immature Granulocytes: 0 %
LYMPHS: 39 %
Lymphocytes Absolute: 6.3 10*3/uL — ABNORMAL HIGH (ref 0.7–3.1)
MCH: 30.1 pg (ref 26.6–33.0)
MCHC: 33.2 g/dL (ref 31.5–35.7)
MCV: 91 fL (ref 79–97)
MONOCYTES: 4 %
Monocytes Absolute: 0.7 10*3/uL (ref 0.1–0.9)
NEUTROS ABS: 8.8 10*3/uL — AB (ref 1.4–7.0)
Neutrophils: 56 %
RBC: 5.08 x10E6/uL (ref 3.77–5.28)
RDW: 13.3 % (ref 12.3–15.4)
WBC: 16.1 10*3/uL — ABNORMAL HIGH (ref 3.4–10.8)

## 2017-11-28 LAB — HEMOGLOBIN A1C
Est. average glucose Bld gHb Est-mCnc: 255 mg/dL
Hgb A1c MFr Bld: 10.5 % — ABNORMAL HIGH (ref 4.8–5.6)

## 2017-12-02 ENCOUNTER — Ambulatory Visit: Payer: Self-pay | Admitting: Adult Health Nurse Practitioner

## 2017-12-02 VITALS — BP 153/77 | HR 85 | Temp 98.3°F | Wt 161.6 lb

## 2017-12-02 DIAGNOSIS — E119 Type 2 diabetes mellitus without complications: Secondary | ICD-10-CM

## 2017-12-02 DIAGNOSIS — E785 Hyperlipidemia, unspecified: Secondary | ICD-10-CM

## 2017-12-02 LAB — GLUCOSE, POCT (MANUAL RESULT ENTRY): POC Glucose: 253 mg/dl — AB (ref 70–99)

## 2017-12-02 MED ORDER — LISINOPRIL 40 MG PO TABS: 20.0000 mg | ORAL_TABLET | Freq: Every day | ORAL | 3 refills | Status: DC

## 2017-12-02 NOTE — Progress Notes (Signed)
   Subjective:    Patient ID: Laurie Kirby, female    DOB: 06-Jan-1961, 57 y.o.   MRN: [id number]  HPI   Laurie LipaBeverly Brod is 57yo female here for f/u of diabetes. Her last A1c was 10.5, which is down from 11.1 13mo ago.    Patient Active Problem List   Diagnosis Date Noted  . Tobacco abuse 09/03/2017  . Abnormal Peters blood cell (WBC) 08/28/2017  . Lichenified rash 01/13/2016  . Hyperlipidemia 01/13/2016  . Abnormal Pap smear of cervix 11/30/2015  . Diabetes (HCC) 07/27/2015  . Coronary atherosclerosis of native coronary artery 07/27/2015  . Post-menopausal bleeding 03/15/2014   Allergies as of 12/02/2017   No Known Allergies     Medication List        Accurate as of 12/02/17  7:54 PM. Always use your most recent med list.          aspirin 81 MG tablet Take 81 mg by mouth daily.   atorvastatin 40 MG tablet Commonly known as:  LIPITOR Take 1 tablet (40 mg total) by mouth daily.   clopidogrel 75 MG tablet Commonly known as:  PLAVIX Take 1 tablet (75 mg total) by mouth daily.   dapagliflozin propanediol 5 MG Tabs tablet Commonly known as:  FARXIGA Take 5 mg by mouth daily. For diabetes   famotidine 20 MG tablet Commonly known as:  PEPCID Take 20 mg by mouth 2 (two) times daily.   glipiZIDE 10 MG tablet Commonly known as:  GLUCOTROL Take 2 tablets (20 mg total) by mouth daily before breakfast.   isosorbide mononitrate 30 MG 24 hr tablet Commonly known as:  IMDUR Take 1 tablet (30 mg total) by mouth daily.   lisinopril 20 MG tablet Commonly known as:  PRINIVIL,ZESTRIL Take 1 tablet (20 mg total) by mouth daily.   metFORMIN 1000 MG tablet Commonly known as:  GLUCOPHAGE Take 1 tablet (1,000 mg total) by mouth 2 (two) times daily with a meal.   metoprolol tartrate 25 MG tablet Commonly known as:  LOPRESSOR Take 1 tablet (25 mg total) by mouth every morning.   mometasone 0.1 % ointment Commonly known as:  ELOCON Apply topically daily.        Review of  Systems  Diabetes - Pt says that she does not want to start insulin yet. She would like to wait 3 mo to get her sugar down.  Hyperlipidemia - last LDL was 202.  Elevated WBC - Pt saw Dr. Smith Robertao in Dec. Dr. notes CLL and since she is asymptomatic, monitor every 6 mo. If her WBC goes above 20, return for f/u.     Objective:   Physical Exam  Constitutional: She is oriented to person, place, and time. She appears well-developed and well-nourished.  Cardiovascular: Normal rate, regular rhythm and normal heart sounds.  Pulmonary/Chest: Effort normal and breath sounds normal.  Abdominal: Soft. Bowel sounds are normal.  Neurological: She is alert and oriented to person, place, and time.  Vitals reviewed.   BP (!) 153/77   Pulse 85   Temp 98.3 F (36.8 C)   Wt 161 lb 9.6 oz (73.3 kg)   BMI 28.63 kg/m   Today CBG is 253 mg/dL.      Assessment & Plan:   Increase Lisinopril to 40mg . F/u in 3 mo w/ labs to evaluate A1c Pt would like to adhere to strict lifestyle modifications and revisit insulin or additional medications at next OV.

## 2017-12-02 NOTE — Progress Notes (Signed)
CBG 253 mg/dL

## 2017-12-04 ENCOUNTER — Ambulatory Visit: Payer: Self-pay

## 2017-12-25 ENCOUNTER — Ambulatory Visit: Payer: Self-pay

## 2018-03-05 ENCOUNTER — Ambulatory Visit: Payer: Self-pay | Admitting: Family Medicine

## 2018-03-05 VITALS — BP 177/86 | Temp 98.3°F | Wt 156.8 lb

## 2018-03-05 DIAGNOSIS — R52 Pain, unspecified: Secondary | ICD-10-CM

## 2018-03-05 DIAGNOSIS — E119 Type 2 diabetes mellitus without complications: Secondary | ICD-10-CM

## 2018-03-05 DIAGNOSIS — Z09 Encounter for follow-up examination after completed treatment for conditions other than malignant neoplasm: Secondary | ICD-10-CM

## 2018-03-05 DIAGNOSIS — E785 Hyperlipidemia, unspecified: Secondary | ICD-10-CM

## 2018-03-05 DIAGNOSIS — F17200 Nicotine dependence, unspecified, uncomplicated: Secondary | ICD-10-CM

## 2018-03-05 DIAGNOSIS — I10 Essential (primary) hypertension: Secondary | ICD-10-CM

## 2018-03-05 DIAGNOSIS — I25119 Atherosclerotic heart disease of native coronary artery with unspecified angina pectoris: Secondary | ICD-10-CM

## 2018-03-05 DIAGNOSIS — D72829 Elevated white blood cell count, unspecified: Secondary | ICD-10-CM

## 2018-03-05 MED ORDER — IBUPROFEN 800 MG PO TABS: 800.0000 mg | ORAL_TABLET | Freq: Three times a day (TID) | ORAL | 0 refills | Status: DC | PRN

## 2018-03-06 ENCOUNTER — Encounter: Payer: Self-pay | Admitting: Family Medicine

## 2018-03-06 LAB — COMPREHENSIVE METABOLIC PANEL
ALT: 13 IU/L (ref 0–32)
AST: 11 IU/L (ref 0–40)
Albumin/Globulin Ratio: 1.4 (ref 1.2–2.2)
Albumin: 4.2 g/dL (ref 3.5–5.5)
Alkaline Phosphatase: 58 IU/L (ref 39–117)
BUN/Creatinine Ratio: 19 (ref 9–23)
BUN: 11 mg/dL (ref 6–24)
Bilirubin Total: <0.2 mg/dL (ref 0.0–1.2)
CO2: 23 mmol/L (ref 20–29)
Calcium: 9.8 mg/dL (ref 8.7–10.2)
Chloride: 103 mmol/L (ref 96–106)
Creatinine, Ser: 0.58 mg/dL (ref 0.57–1.00)
GFR calc Af Amer: 120 mL/min/{1.73_m2} (ref >59)
GFR calc non Af Amer: 104 mL/min/{1.73_m2} (ref >59)
Globulin, Total: 3.1 g/dL (ref 1.5–4.5)
Glucose: 211 mg/dL — ABNORMAL HIGH (ref 65–99)
Potassium: 4.1 mmol/L (ref 3.5–5.2)
Sodium: 140 mmol/L (ref 134–144)
Total Protein: 7.3 g/dL (ref 6.0–8.5)

## 2018-03-06 LAB — CBC WITH DIFFERENTIAL/PLATELET
Basophils Absolute: 0 10*3/uL (ref 0.0–0.2)
Basos: 0 %
EOS (ABSOLUTE): 0.3 10*3/uL (ref 0.0–0.4)
Eos: 2 %
Hematocrit: 40.9 % (ref 34.0–46.6)
Hemoglobin: 13.7 g/dL (ref 11.1–15.9)
Immature Grans (Abs): 0 10*3/uL (ref 0.0–0.1)
Immature Granulocytes: 0 %
Lymphocytes Absolute: 4.7 10*3/uL — ABNORMAL HIGH (ref 0.7–3.1)
Lymphs: 31 %
MCH: 29.8 pg (ref 26.6–33.0)
MCHC: 33.5 g/dL (ref 31.5–35.7)
MCV: 89 fL (ref 79–97)
Monocytes Absolute: 0.6 10*3/uL (ref 0.1–0.9)
Monocytes: 4 %
Neutrophils Absolute: 9.7 10*3/uL — ABNORMAL HIGH (ref 1.4–7.0)
Neutrophils: 63 %
Platelets: 355 10*3/uL (ref 150–379)
RBC: 4.6 x10E6/uL (ref 3.77–5.28)
RDW: 13.6 % (ref 12.3–15.4)
WBC: 15.3 10*3/uL — ABNORMAL HIGH (ref 3.4–10.8)

## 2018-03-06 LAB — LIPID PANEL
Chol/HDL Ratio: 5 ratio — ABNORMAL HIGH (ref 0.0–4.4)
Cholesterol, Total: 211 mg/dL — ABNORMAL HIGH (ref 100–199)
HDL: 42 mg/dL (ref >39)
LDL Calculated: 151 mg/dL — ABNORMAL HIGH (ref 0–99)
Triglycerides: 88 mg/dL (ref 0–149)
VLDL Cholesterol Cal: 18 mg/dL (ref 5–40)

## 2018-03-06 LAB — HEMOGLOBIN A1C
Est. average glucose Bld gHb Est-mCnc: 223 mg/dL
Hgb A1c MFr Bld: 9.4 % — ABNORMAL HIGH (ref 4.8–5.6)

## 2018-03-06 NOTE — Progress Notes (Signed)
.. Patient: Laurie Kirby Female    DOB: 03/23/1961   57 y.o.   MRN: [id number] Visit Date: 03/06/2018  Today's Provider: Kallie Locks, FNP   Chief Complaint  Patient presents with  . Follow-up  . Hip Pain   Subjective:   HPI  Patient is here today with complaints of right lower abdominal pain. She states that her pain began a few weeks ago when she bent down to help a small child. At that time, she believes that she may have pulled some ligaments in her lower right abdomin. Her pain began the day after the incident and she did experience mild redness and swelling in the area, which has resolved. She states that she can sit, but only on high chairs. She states that if she sits in lower chairs or commodes the pain is unbearable. She did visit a local Urgent Care after the incident and was told to take Acetaminophen, which she states does not help her pain. She states that she is currently not taking any medication for her pain and discomfort. She denies any falls, dizziness, visual changes, and unsteadiness.   She denies cough, chest pain, and shortness of breath. She denies any recent fevers, chills, unintentional weight loss, and night sweats.   She states that she has normal bowel movements and a good appetite. She denies nausea, vomiting, and any incidents of bleeding.   She continues to smoke 6 cigarettes a day.   She denies any other pain issues today  No Known Allergies Previous Medications   ASPIRIN 81 MG TABLET    Take 81 mg by mouth daily.   ATORVASTATIN (LIPITOR) 40 MG TABLET    Take 1 tablet (40 mg total) by mouth daily.   CLOPIDOGREL (PLAVIX) 75 MG TABLET    Take 1 tablet (75 mg total) by mouth daily.   DAPAGLIFLOZIN PROPANEDIOL (FARXIGA) 5 MG TABS TABLET    Take 5 mg by mouth daily. For diabetes   FAMOTIDINE (PEPCID) 20 MG TABLET    Take 20 mg by mouth 2 (two) times daily.   GLIPIZIDE (GLUCOTROL) 10 MG TABLET    Take 2 tablets (20 mg total) by mouth daily before  breakfast.   ISOSORBIDE MONONITRATE (IMDUR) 30 MG 24 HR TABLET    Take 1 tablet (30 mg total) by mouth daily.   LISINOPRIL (PRINIVIL,ZESTRIL) 40 MG TABLET    Take 0.5 tablets (20 mg total) by mouth daily.   METFORMIN (GLUCOPHAGE) 1000 MG TABLET    Take 1 tablet (1,000 mg total) by mouth 2 (two) times daily with a meal.   METOPROLOL TARTRATE (LOPRESSOR) 25 MG TABLET    Take 1 tablet (25 mg total) by mouth every morning.   MOMETASONE (ELOCON) 0.1 % OINTMENT    Apply topically daily.    Review of Systems  Constitutional: Negative.   HENT: Negative.   Eyes: Negative.   Respiratory: Negative.   Cardiovascular: Negative.   Gastrointestinal: Positive for abdominal pain.  Endocrine: Negative.   Genitourinary: Negative.   Musculoskeletal: Negative.   Skin: Negative.   Allergic/Immunologic: Negative.   Neurological: Negative.   Hematological: Negative.   Psychiatric/Behavioral: Negative.     Social History   Tobacco Use  . Smoking status: Former Smoker    Last attempt to quit: 12/07/2013    Years since quitting: 4.2  . Smokeless tobacco: Never Used  Substance Use Topics  . Alcohol use: Yes    Alcohol/week: 2.4 oz    Types: 4 Glasses of  wine per week    Comment:  wine on the weekends   Objective:   BP (!) 177/86   Temp 98.3 F (36.8 C)   Wt 156 lb 12.8 oz (71.1 kg)   BMI 27.78 kg/m   Physical Exam  Constitutional: She appears well-developed and well-nourished.  HENT:  Head: Normocephalic and atraumatic.  Eyes: Pupils are equal, round, and reactive to light. Conjunctivae and EOM are normal.  Neck: Normal range of motion. Neck supple.  Cardiovascular: Normal rate, regular rhythm, normal heart sounds and intact distal pulses.  Abdominal: There is tenderness. There is guarding.  Mild erythema and tenderness in lower right abdomen.  Musculoskeletal: Normal range of motion.  Skin: Skin is warm. Capillary refill takes less than 2 seconds.  Psychiatric: She has a normal mood and  affect. Her behavior is normal. Judgment and thought content normal.  Nursing note and vitals reviewed.      Assessment & Plan:  1. Diabetes mellitus without complication (HCC) Stable. Most recent Glucose on 12/02/2017 was elevated at 12/02/2017. Last Hgb A1c on 11/27/2017 was elevated at 10.5, which was improved from 11.1 on 08/14/2018. She is to continue Glipizide and Metformin as directed.  She is encouraged to continue eating more vegetables with meals, decreasing carbohydrates, high-fat foods, and decrease sugars. She is to continue to exercise at least 30 minutes daily.   We will reassess Hgb A1c today.    - HgB A1c - CBC w/Diff - Comprehensive metabolic panel; Future - Comprehensive metabolic panel   2. Hyperlipidemia, unspecified hyperlipidemia type Recent Lipid panel on 11/27/2017 Cholesterrol level elevated at 277, HDL normal level at 50, LDL elevated at 202. We will repeat Lipid panel today to re-evaluate. She will continue Lipitor as directed.    - Lipid Profile - CBC w/Diff - Comprehensive metabolic panel; Future - Comprehensive metabolic panel  3. 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 We will repeat Lipid panel.   4. Essential hypertension BP is 177/86 today.  She will continue taking Lisinopril, Imdur, and Metoprolol as directed. Monitor.   5. Pain We will order new Rx for Motrin 800 mg every 8 hours as needed. We will reassess lab and evaluate effectiveness of pain med in 1 week.   6. Smoker She continues to smoke 6 cigarettes daily. She is currently not ready for smoking cessation at this time. We will continue to assess.   7. Leukocytosis, unspecified type WBC is stable at 16.7. She was previously evaluated and released by Hematology. We will continue to watch her WBCs closely and refer back to Hematology if  warranted.  8. Follow up Follow up in 1 week.    Kallie Locks, FNP   [facility]

## 2018-03-12 ENCOUNTER — Ambulatory Visit: Payer: Self-pay | Admitting: Adult Health Nurse Practitioner

## 2018-03-12 DIAGNOSIS — E119 Type 2 diabetes mellitus without complications: Secondary | ICD-10-CM

## 2018-03-12 DIAGNOSIS — I25119 Atherosclerotic heart disease of native coronary artery with unspecified angina pectoris: Secondary | ICD-10-CM

## 2018-03-12 DIAGNOSIS — E785 Hyperlipidemia, unspecified: Secondary | ICD-10-CM

## 2018-03-12 DIAGNOSIS — M25551 Pain in right hip: Secondary | ICD-10-CM | POA: Insufficient documentation

## 2018-03-12 MED ORDER — LISINOPRIL 40 MG PO TABS: 20.0000 mg | ORAL_TABLET | Freq: Every day | ORAL | 3 refills | Status: DC

## 2018-03-12 MED ORDER — FAMOTIDINE 20 MG PO TABS: 20.0000 mg | ORAL_TABLET | Freq: Two times a day (BID) | ORAL | 3 refills | Status: DC

## 2018-03-12 MED ORDER — METFORMIN HCL 1000 MG PO TABS: 1000.0000 mg | ORAL_TABLET | Freq: Two times a day (BID) | ORAL | 1 refills | Status: DC

## 2018-03-12 MED ORDER — GLIPIZIDE 10 MG PO TABS: 20.0000 mg | ORAL_TABLET | Freq: Every day | ORAL | 1 refills | Status: DC

## 2018-03-12 MED ORDER — METOPROLOL TARTRATE 25 MG PO TABS: 25.0000 mg | ORAL_TABLET | Freq: Two times a day (BID) | ORAL | 0 refills | Status: DC

## 2018-03-12 MED ORDER — METOPROLOL TARTRATE 25 MG PO TABS: 25.0000 mg | ORAL_TABLET | Freq: Every morning | ORAL | 1 refills | Status: DC

## 2018-03-12 MED ORDER — CLOPIDOGREL BISULFATE 75 MG PO TABS: 75.0000 mg | ORAL_TABLET | Freq: Every day | ORAL | 1 refills | Status: DC

## 2018-03-12 MED ORDER — ATORVASTATIN CALCIUM 40 MG PO TABS: 40.0000 mg | ORAL_TABLET | Freq: Every day | ORAL | 0 refills | Status: DC

## 2018-03-12 MED ORDER — ISOSORBIDE MONONITRATE ER 30 MG PO TB24: 30.0000 mg | ORAL_TABLET | Freq: Every day | ORAL | 1 refills | Status: DC

## 2018-03-12 NOTE — Progress Notes (Signed)
Subjective:    Patient ID: Laurie Kirby, female    DOB: 15-Apr-1961, 57 y.o.   MRN: [id number]  HPI  Laurie Kirby is a 57 yo F here for f/u of pain of R lower abdomen down to knee. At last visit she was Rx Motrin  PRN. Pt reports no improvement with Motrin. Pt has difficulty sitting but not walking.  HTN: BP elevated at 180/86. Pt is taking Lisinopril  and Metoprolol .   Patient Active Problem List   Diagnosis Date Noted  . Tobacco abuse 09/03/2017  . Abnormal Penrose blood cell (WBC) 08/28/2017  . Lichenified rash 01/13/2016  . Hyperlipidemia 01/13/2016  . Abnormal Pap smear of cervix 11/30/2015  . Diabetes mellitus without complication (HCC) 07/27/2015  . Coronary atherosclerosis of native coronary artery 07/27/2015  . Post-menopausal bleeding 03/15/2014   Allergies as of 03/12/2018   No Known Allergies     Medication List        Accurate as of 03/12/18  7:08 PM. Always use your most recent med list.          aspirin 81 MG tablet Take 81 mg by mouth daily.   atorvastatin 40 MG tablet Commonly known as:  LIPITOR Take 1 tablet (40 mg total) by mouth daily.   clopidogrel 75 MG tablet Commonly known as:  PLAVIX Take 1 tablet (75 mg total) by mouth daily.   dapagliflozin propanediol 5 MG Tabs tablet Commonly known as:  FARXIGA Take 5 mg by mouth daily. For diabetes   famotidine 20 MG tablet Commonly known as:  PEPCID Take 20 mg by mouth 2 (two) times daily.   glipiZIDE 10 MG tablet Commonly known as:  GLUCOTROL Take 2 tablets (20 mg total) by mouth daily before breakfast.   ibuprofen 800 MG tablet Commonly known as:  ADVIL,MOTRIN Take 1 tablet (800 mg total) by mouth every 8 (eight) hours as needed.   isosorbide mononitrate 30 MG 24 hr tablet Commonly known as:  IMDUR Take 1 tablet (30 mg total) by mouth daily.   lisinopril 40 MG tablet Commonly known as:  PRINIVIL,ZESTRIL Take 0.5 tablets (20 mg total) by mouth daily.   metFORMIN 1000 MG  tablet Commonly known as:  GLUCOPHAGE Take 1 tablet (1,000 mg total) by mouth 2 (two) times daily with a meal.   metoprolol tartrate 25 MG tablet Commonly known as:  LOPRESSOR Take 1 tablet (25 mg total) by mouth every morning.   mometasone 0.1 % ointment Commonly known as:  ELOCON Apply topically daily.          Review of Systems I reviewed all labs. Systems have improved.      Objective:   Physical Exam  Constitutional: She appears well-developed and well-nourished.  Cardiovascular: Normal rate, regular rhythm and normal heart sounds.  Pulmonary/Chest: Effort normal and breath sounds normal.  Abdominal: Soft. Bowel sounds are normal.  Vitals reviewed.   BP (!) 180/86   Pulse 91   Temp 99.2 F (37.3 C) (Oral)   Wt 157 lb 3.2 oz (71.3 kg)   BMI 27.85 kg/m   Pain with palpitation of R groin. Limited ROM due to pain. No bruising at area. Some swelling.      Assessment & Plan:   Refilled medications.   Pain: Ordered DG of R Hip and Pelvis.  Continue Motrin . Encouraged pt to try icing and heat.   HTN:  Not controlled.  Increase Metoprolol  BID. Continue Lisinopril .   F/u in 2 weeks  for imaging results and pain evaluation and BP check.

## 2018-03-18 ENCOUNTER — Ambulatory Visit
Admission: RE | Admit: 2018-03-18 | Discharge: 2018-03-18 | Disposition: A | Discharge status: Home / Self Care | Payer: Self-pay | Source: Ambulatory Visit | Attending: Adult Health Nurse Practitioner | Admitting: Adult Health Nurse Practitioner

## 2018-03-18 ENCOUNTER — Other Ambulatory Visit: Payer: Self-pay | Admitting: Adult Health Nurse Practitioner

## 2018-03-18 DIAGNOSIS — M25551 Pain in right hip: Secondary | ICD-10-CM

## 2018-03-26 ENCOUNTER — Ambulatory Visit: Payer: Self-pay

## 2018-03-27 ENCOUNTER — Telehealth: Payer: Self-pay

## 2018-03-27 NOTE — Telephone Encounter (Signed)
Gave pt results of hip xray. She will discuss what she can take for the arthritis at the next appt.

## 2018-03-27 NOTE — Telephone Encounter (Signed)
-----   Message from Jacelyn Pi, NP sent at 03/26/2018  6:15 PM EDT ----- No acute findings on xray. All degenerative arthritis changes.

## 2018-03-31 ENCOUNTER — Ambulatory Visit: Payer: Self-pay | Admitting: Family Medicine

## 2018-03-31 VITALS — BP 177/91 | HR 87 | Temp 98.5°F | Wt 155.5 lb

## 2018-03-31 DIAGNOSIS — M25551 Pain in right hip: Secondary | ICD-10-CM

## 2018-03-31 DIAGNOSIS — Z09 Encounter for follow-up examination after completed treatment for conditions other than malignant neoplasm: Secondary | ICD-10-CM

## 2018-03-31 MED ORDER — IBUPROFEN 800 MG PO TABS: 800.0000 mg | ORAL_TABLET | Freq: Three times a day (TID) | ORAL | 3 refills | Status: DC | PRN

## 2018-03-31 NOTE — Progress Notes (Signed)
.. Patient: Laurie Kirby Female    DOB: October 16, 1961   57 y.o.   MRN: [id number] Visit Date: 04/05/2018 9 Today's Provider: Kallie Locks, FNP   Chief Complaint  Patient presents with  . Follow-up    Right leg pain   Subjective:   HPI  Laurie Kirby has a medical history of Hypertension, Hyperlipidemia, and Diabetes. She is her today for follow up on chronic diseases.    Current Status:  Since her last office visit, the pain in her right lower abdominal pain is improving. She was prescribed Motrin 800 mg as needed. She states that medication has been effective. She underwent a scan of pelvis and hips on 03/19/2018, which were negative.   She denies cough, chest pain, and shortness of breath. She denies any recent fevers, chills, unintentional weight loss, and night sweats.   She states that she has normal bowel movements and a good appetite. She denies nausea, vomiting, and any incidents of bleeding.   She continues to smoke 6 cigarettes a day.   She denies any other pain issues today  No Known Allergies Previous Medications   ASPIRIN 81 MG TABLET    Take 81 mg by mouth daily.   ATORVASTATIN (LIPITOR) 40 MG TABLET    Take 1 tablet (40 mg total) by mouth daily.   CLOPIDOGREL (PLAVIX) 75 MG TABLET    Take 1 tablet (75 mg total) by mouth daily.   FAMOTIDINE (PEPCID) 20 MG TABLET    Take 1 tablet (20 mg total) by mouth 2 (two) times daily.   GLIPIZIDE (GLUCOTROL) 10 MG TABLET    Take 2 tablets (20 mg total) by mouth daily before breakfast.   ISOSORBIDE MONONITRATE (IMDUR) 30 MG 24 HR TABLET    Take 1 tablet (30 mg total) by mouth daily.   LISINOPRIL (PRINIVIL,ZESTRIL) 40 MG TABLET    Take 1 tablet (40 mg total) by mouth daily.   METFORMIN (GLUCOPHAGE) 1000 MG TABLET    Take 1 tablet (1,000 mg total) by mouth 2 (two) times daily with a meal.   METOPROLOL TARTRATE (LOPRESSOR) 25 MG TABLET    Take 1 tablet (25 mg total) by mouth 2 (two) times daily.   MOMETASONE (ELOCON) 0.1 % OINTMENT     Apply topically daily.    Review of Systems  Constitutional: Negative.   HENT: Negative.   Eyes: Negative.   Respiratory: Negative.   Cardiovascular: Negative.   Gastrointestinal: Positive for abdominal pain.  Endocrine: Negative.   Genitourinary: Negative.   Musculoskeletal: Negative.   Skin: Negative.   Allergic/Immunologic: Negative.   Neurological: Negative.   Hematological: Negative.   Psychiatric/Behavioral: Negative.     Social History   Tobacco Use  . Smoking status: Former Smoker    Last attempt to quit: 12/07/2013    Years since quitting: 4.3  . Smokeless tobacco: Never Used  Substance Use Topics  . Alcohol use: Yes    Alcohol/week: 2.4 oz    Types: 4 Glasses of wine per week    Comment:  wine on the weekends   Objective:   BP (!) 177/91 (BP Location: Left Arm, Patient Position: Standing)   Pulse 87   Temp 98.5 F (36.9 C) (Oral)   Wt 155 lb 8 oz (70.5 kg)   BMI 27.55 kg/m   Physical Exam  Constitutional: She appears well-developed and well-nourished.  HENT:  Head: Normocephalic and atraumatic.  Eyes: Pupils are equal, round, and reactive to light. Conjunctivae and EOM are normal.  Neck: Normal range of motion. Neck supple.  Cardiovascular: Normal rate, regular rhythm, normal heart sounds and intact distal pulses.  Abdominal: There is tenderness (mild ).  Musculoskeletal: Normal range of motion.  Skin: Skin is warm. Capillary refill takes less than 2 seconds.  Psychiatric: She has a normal mood and affect. Her behavior is normal. Judgment and thought content normal.  Nursing note and vitals reviewed.  Assessment & Plan:   1. Right hip pain Much improved. She will continue Motrin as directed.  - ibuprofen (ADVIL,MOTRIN) 800 MG tablet; Take 1 tablet (800 mg total) by mouth every 8 (eight) hours as needed.  Dispense: 60 tablet; Refill: 3  2. Follow up She will follow up in 3 months.   Meds ordered this encounter  Medications  . DISCONTD:  ibuprofen (ADVIL,MOTRIN) 800 MG tablet    Sig: Take 1 tablet (800 mg total) by mouth every 8 (eight) hours as needed.    Dispense:  60 tablet    Refill:  3  . ibuprofen (ADVIL,MOTRIN) 800 MG tablet    Sig: Take 1 tablet (800 mg total) by mouth every 8 (eight) hours as needed.    Dispense:  60 tablet    Refill:  3    Raliegh IpNatalie Cornelius Marullo,  MSN, FNP-BC [facility]  [place] [address] [place], [place] [id number] [phone]

## 2018-05-20 ENCOUNTER — Other Ambulatory Visit: Payer: Self-pay | Admitting: Adult Health Nurse Practitioner

## 2018-06-30 ENCOUNTER — Other Ambulatory Visit: Payer: Self-pay

## 2018-06-30 DIAGNOSIS — E785 Hyperlipidemia, unspecified: Secondary | ICD-10-CM

## 2018-07-01 LAB — CBC WITH DIFFERENTIAL
BASOS ABS: 0.1 10*3/uL (ref 0.0–0.2)
Basos: 0 %
EOS (ABSOLUTE): 0.3 10*3/uL (ref 0.0–0.4)
Eos: 2 %
HEMOGLOBIN: 15.7 g/dL (ref 11.1–15.9)
Hematocrit: 47.7 % — ABNORMAL HIGH (ref 34.0–46.6)
IMMATURE GRANS (ABS): 0 10*3/uL (ref 0.0–0.1)
IMMATURE GRANULOCYTES: 0 %
LYMPHS ABS: 5.2 10*3/uL — AB (ref 0.7–3.1)
Lymphs: 33 %
MCH: 30 pg (ref 26.6–33.0)
MCHC: 32.9 g/dL (ref 31.5–35.7)
MCV: 91 fL (ref 79–97)
Monocytes Absolute: 0.7 10*3/uL (ref 0.1–0.9)
Monocytes: 5 %
NEUTROS PCT: 60 %
Neutrophils Absolute: 9.5 10*3/uL — ABNORMAL HIGH (ref 1.4–7.0)
RBC: 5.24 x10E6/uL (ref 3.77–5.28)
RDW: 12.6 % (ref 12.3–15.4)
WBC: 15.9 10*3/uL — ABNORMAL HIGH (ref 3.4–10.8)

## 2018-07-07 ENCOUNTER — Ambulatory Visit: Payer: Self-pay | Admitting: Family Medicine

## 2018-07-07 VITALS — BP 159/89 | HR 89 | Temp 98.0°F | Wt 155.9 lb

## 2018-07-07 DIAGNOSIS — E785 Hyperlipidemia, unspecified: Secondary | ICD-10-CM

## 2018-07-07 DIAGNOSIS — E119 Type 2 diabetes mellitus without complications: Secondary | ICD-10-CM

## 2018-07-07 DIAGNOSIS — M25551 Pain in right hip: Secondary | ICD-10-CM

## 2018-07-07 DIAGNOSIS — I10 Essential (primary) hypertension: Secondary | ICD-10-CM

## 2018-07-07 DIAGNOSIS — F17200 Nicotine dependence, unspecified, uncomplicated: Secondary | ICD-10-CM

## 2018-07-07 DIAGNOSIS — Z09 Encounter for follow-up examination after completed treatment for conditions other than malignant neoplasm: Secondary | ICD-10-CM

## 2018-07-07 DIAGNOSIS — I25119 Atherosclerotic heart disease of native coronary artery with unspecified angina pectoris: Secondary | ICD-10-CM

## 2018-07-07 MED ORDER — METOPROLOL TARTRATE 50 MG PO TABS: 50.0000 mg | ORAL_TABLET | Freq: Two times a day (BID) | ORAL | 1 refills | Status: DC

## 2018-07-07 MED ORDER — FAMOTIDINE 20 MG PO TABS: 20.0000 mg | ORAL_TABLET | Freq: Two times a day (BID) | ORAL | 6 refills | Status: DC

## 2018-07-07 NOTE — Progress Notes (Signed)
Follow Up  Subjective:    Patient ID: Laurie Kirby, female    DOB: 03-30-1961, 57 y.o.   MRN: [id number]   Chief Complaint  Patient presents with  . Follow-up   HPI  Laurie Kirby is a 57 year old female with a past medical history of Hypertension, Hyperlipidemia, and Diabetes. She is here today for follow up.   Current Status: Since her last office visit, she is doing well with no complaints. She occasionally has arthritic pain. She continues to smoke 5 cigarettes daily.   She denies visual changes, chest pain, heart palpitations, and falls. She has occasionally headaches and dizziness with position changes.   Denies severe headaches, confusion, seizures, double vision, and blurred vision, nausea and vomiting.  She denies fevers, chills, fatigue, recent infections, weight loss, and night sweats. She has not had any headaches, visual changes, dizziness, and falls. No chest pain, heart palpitations, cough and shortness of breath reported. No reports of GI problems such as nausea, vomiting, diarrhea, and constipation. She has no reports of blood in stools, dysuria and hematuria. No depression or anxiety reported. She denies pain today.   Review of Systems  Constitutional: Negative.   HENT: Negative.   Eyes: Negative.   Respiratory: Negative.   Cardiovascular: Negative.   Gastrointestinal: Negative.   Endocrine: Negative.   Genitourinary: Negative.   Musculoskeletal: Positive for arthralgias (generalized).  Skin: Negative.   Allergic/Immunologic: Negative.   Neurological: Negative.   Hematological: Negative.   Psychiatric/Behavioral: Negative.    Objective:   Physical Exam  Constitutional: She is oriented to person, place, and time. She appears well-developed and well-nourished.  HENT:  Head: Normocephalic and atraumatic.  Right Ear: External ear normal.  Left Ear: External ear normal.  Nose: Nose normal.  Mouth/Throat: Oropharynx is clear and moist.  Eyes: Pupils are equal,  round, and reactive to light. Conjunctivae and EOM are normal.  Neck: Normal range of motion. Neck supple.  Cardiovascular: Normal rate, regular rhythm, normal heart sounds and intact distal pulses.  Pulmonary/Chest: Effort normal and breath sounds normal.  Abdominal: Soft. Bowel sounds are normal.  Musculoskeletal: Normal range of motion.  Neurological: She is alert and oriented to person, place, and time.  Skin: Skin is warm and dry. Capillary refill takes less than 2 seconds.  Psychiatric: She has a normal mood and affect. Her behavior is normal. Judgment and thought content normal.  Nursing note and vitals reviewed.  Assessment & Plan:   1. Essential hypertension Blood pressure is elevated at 159/89 today. She will continue Plavix, Imdur, Lisinopril, and Metoprolol as prescribed. We will re-assess blood pressure at next office visit.  - metoprolol tartrate (LOPRESSOR) 50 MG tablet; Take 1 tablet (50 mg total) by mouth 2 (two) times daily.  Dispense: 60 tablet; Refill: 1  2. 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 Type 2 diabetes mellitus without complication, without long-term current use of insulin (HCC) Hgb A1c is improved at 9.4 on 03/05/2018, from 10.5 on 11/27/2017. Blood glucose levels improved at 211. She will continue Glipizide and Metformin as prescribed.  She will continue to decrease foods/beverages high in sugars and carbs and follow Heart Healthy or DASH diet. Increase physical activity to at least 30 minutes cardio exercise daily.   4. Right hip pain Not worsening. Continue Motrin as needed.   5. Hyperlipidemia, unspecified hyperlipidemia type Cholesterol levels are improving at 211 on 03/05/2018, from 277 on 11/27/2017. Continue Lipitor as  prescribed.   6. Smoker Continues to smoke. She will follow up with [place] when she wants to begin smoking cessation.   7. Follow up She will follow up in 1  month.   Meds ordered this encounter  Medications  . famotidine (PEPCID) 20 MG tablet    Sig: Take 1 tablet (20 mg total) by mouth 2 (two) times daily.    Dispense:  30 tablet    Refill:  6  . metoprolol tartrate (LOPRESSOR) 50 MG tablet    Sig: Take 1 tablet (50 mg total) by mouth 2 (two) times daily.    Dispense:  60 tablet    Refill:  1    Raliegh Ip,  MSN, FNP-C [facility] [address] [place], [place] [address] [phone]

## 2018-08-04 ENCOUNTER — Ambulatory Visit: Payer: Self-pay | Admitting: Family Medicine

## 2018-08-04 VITALS — BP 180/86 | HR 77 | Temp 98.3°F | Ht 63.0 in | Wt 158.8 lb

## 2018-08-04 DIAGNOSIS — I10 Essential (primary) hypertension: Secondary | ICD-10-CM

## 2018-08-04 DIAGNOSIS — E119 Type 2 diabetes mellitus without complications: Secondary | ICD-10-CM

## 2018-08-04 DIAGNOSIS — Z09 Encounter for follow-up examination after completed treatment for conditions other than malignant neoplasm: Secondary | ICD-10-CM

## 2018-08-04 NOTE — Patient Instructions (Signed)
Heart-Healthy Eating Plan Heart-healthy meal planning includes:  Limiting unhealthy fats.  Increasing healthy fats.  Making other small dietary changes.  You may need to talk with your doctor or a diet specialist (dietitian) to create an eating plan that is right for you. What types of fat should I choose?  Choose healthy fats. These include olive oil and canola oil, flaxseeds, walnuts, almonds, and seeds.  Eat more omega-3 fats. These include salmon, mackerel, sardines, tuna, flaxseed oil, and ground flaxseeds. Try to eat fish at least twice each week.  Limit saturated fats. ? Saturated fats are often found in animal products, such as meats, butter, and cream. ? Plant sources of saturated fats include palm oil, palm kernel oil, and coconut oil.  Avoid foods with partially hydrogenated oils in them. These include stick margarine, some tub margarines, cookies, crackers, and other baked goods. These contain trans fats. What general guidelines do I need to follow?  Check food labels carefully. Identify foods with trans fats or high amounts of saturated fat.  Fill one half of your plate with vegetables and green salads. Eat 4-5 servings of vegetables per day. A serving of vegetables is: ? 1 cup of raw leafy vegetables. ?  cup of raw or cooked cut-up vegetables. ?  cup of vegetable juice.  Fill one fourth of your plate with whole grains. Look for the word "whole" as the first word in the ingredient list.  Fill one fourth of your plate with lean protein foods.  Eat 4-5 servings of fruit per day. A serving of fruit is: ? One medium whole fruit. ?  cup of dried fruit. ?  cup of fresh, frozen, or canned fruit. ?  cup of 100% fruit juice.  Eat more foods that contain soluble fiber. These include apples, broccoli, carrots, beans, peas, and barley. Try to get 20-30 g of fiber per day.  Eat more home-cooked food. Eat less restaurant, buffet, and fast food.  Limit or avoid  alcohol.  Limit foods high in starch and sugar.  Avoid fried foods.  Avoid frying your food. Try baking, boiling, grilling, or broiling it instead. You can also reduce fat by: ? Removing the skin from poultry. ? Removing all visible fats from meats. ? Skimming the fat off of stews, soups, and gravies before serving them. ? Steaming vegetables in water or broth.  Lose weight if you are overweight.  Eat 4-5 servings of nuts, legumes, and seeds per week: ? One serving of dried beans or legumes equals  cup after being cooked. ? One serving of nuts equals 1 ounces. ? One serving of seeds equals  ounce or one tablespoon.  You may need to keep track of how much salt or sodium you eat. This is especially true if you have high blood pressure. Talk with your doctor or dietitian to get more information. What foods can I eat? Grains Breads, including French, [place], pita, wheat, raisin, rye, oatmeal, and Italian. Tortillas that are neither fried nor made with lard or trans fat. Low-fat rolls, including hotdog and hamburger buns and English muffins. Biscuits. Muffins. Waffles. Pancakes. Light popcorn. Whole-grain cereals. Flatbread. Melba toast. Pretzels. Breadsticks. Rusks. Low-fat snacks. Low-fat crackers, including oyster, saltine, matzo, graham, animal, and rye. Rice and pasta, including brown rice and pastas that are made with whole wheat. Vegetables All vegetables. Fruits All fruits, but limit coconut. Meats and Other Protein Sources Lean, well-trimmed beef, veal, pork, and lamb. Chicken and turkey without skin. All fish and shellfish.   Wild duck, rabbit, pheasant, and venison. Egg whites or low-cholesterol egg substitutes. Dried beans, peas, lentils, and tofu. Seeds and most nuts. Dairy Low-fat or nonfat cheeses, including ricotta, string, and mozzarella. Skim or 1% milk that is liquid, powdered, or evaporated. Buttermilk that is made with low-fat milk. Nonfat or low-fat  yogurt. Beverages Mineral water. Diet carbonated beverages. Sweets and Desserts Sherbets and fruit ices. Honey, jam, marmalade, jelly, and syrups. Meringues and gelatins. Pure sugar candy, such as hard candy, jelly beans, gumdrops, mints, marshmallows, and small amounts of dark chocolate. Angel food cake. Eat all sweets and desserts in moderation. Fats and Oils Nonhydrogenated (trans-free) margarines. Vegetable oils, including soybean, sesame, sunflower, olive, peanut, safflower, corn, canola, and cottonseed. Salad dressings or mayonnaise made with a vegetable oil. Limit added fats and oils that you use for cooking, baking, salads, and as spreads. Other Cocoa powder. Coffee and tea. All seasonings and condiments. The items listed above may not be a complete list of recommended foods or beverages. Contact your dietitian for more options. What foods are not recommended? Grains Breads that are made with saturated or trans fats, oils, or whole milk. Croissants. Butter rolls. Cheese breads. Sweet rolls. Donuts. Buttered popcorn. Chow mein noodles. High-fat crackers, such as cheese or butter crackers. Meats and Other Protein Sources Fatty meats, such as hotdogs, short ribs, sausage, spareribs, bacon, rib eye roast or steak, and mutton. High-fat deli meats, such as salami and bologna. Caviar. Domestic duck and goose. Organ meats, such as kidney, liver, sweetbreads, and heart. Dairy Cream, sour cream, cream cheese, and creamed cottage cheese. Whole-milk cheeses, including blue (bleu), Monterey Jack, Brie, [place], American, Havarti, Swiss, cheddar, Camembert, and [place]. Whole or 2% milk that is liquid, evaporated, or condensed. Whole buttermilk. Cream sauce or high-fat cheese sauce. Yogurt that is made from whole milk. Beverages Regular sodas and juice drinks with added sugar. Sweets and Desserts Frosting. Pudding. Cookies. Cakes other than angel food cake. Candy that has milk chocolate or [place]  chocolate, hydrogenated fat, butter, coconut, or unknown ingredients. Buttered syrups. Full-fat ice cream or ice cream drinks. Fats and Oils Gravy that has suet, meat fat, or shortening. Cocoa butter, hydrogenated oils, palm oil, coconut oil, palm kernel oil. These can often be found in baked products, candy, fried foods, nondairy creamers, and whipped toppings. Solid fats and shortenings, including bacon fat, salt pork, lard, and butter. Nondairy cream substitutes, such as coffee creamers and sour cream substitutes. Salad dressings that are made of unknown oils, cheese, or sour cream. The items listed above may not be a complete list of foods and beverages to avoid. Contact your dietitian for more information. This information is not intended to replace advice given to you by your health care provider. Make sure you discuss any questions you have with your health care provider. Document Released: 04/14/2012 Document Revised: 03/21/2016 Document Reviewed: 04/07/2014 Elsevier Interactive Patient Education  2018 [employer] DASH Eating Plan DASH stands for "Dietary Approaches to Stop Hypertension." The DASH eating plan is a healthy eating plan that has been shown to reduce high blood pressure (hypertension). It may also reduce your risk for type 2 diabetes, heart disease, and stroke. The DASH eating plan may also help with weight loss. What are tips for following this plan? General guidelines  Avoid eating more than 2,300 mg (milligrams) of salt (sodium) a day. If you have hypertension, you may need to reduce your sodium intake to 1,500 mg a day.  Limit alcohol intake to no more   than 1 drink a day for nonpregnant women and 2 drinks a day for men. One drink equals 12 oz of beer, 5 oz of wine, or 1 oz of hard liquor.  Work with your health care provider to maintain a healthy body weight or to lose weight. Ask what an ideal weight is for you.  Get at least 30 minutes of exercise that causes your  heart to beat faster (aerobic exercise) most days of the week. Activities may include walking, swimming, or biking.  Work with your health care provider or diet and nutrition specialist (dietitian) to adjust your eating plan to your individual calorie needs. Reading food labels  Check food labels for the amount of sodium per serving. Choose foods with less than 5 percent of the Daily Value of sodium. Generally, foods with less than 300 mg of sodium per serving fit into this eating plan.  To find whole grains, look for the word "whole" as the first word in the ingredient list. Shopping  Buy products labeled as "low-sodium" or "no salt added."  Buy fresh foods. Avoid canned foods and premade or frozen meals. Cooking  Avoid adding salt when cooking. Use salt-free seasonings or herbs instead of table salt or sea salt. Check with your health care provider or pharmacist before using salt substitutes.  Do not fry foods. Cook foods using healthy methods such as baking, boiling, grilling, and broiling instead.  Cook with heart-healthy oils, such as olive, canola, soybean, or sunflower oil. Meal planning   Eat a balanced diet that includes: ? 5 or more servings of fruits and vegetables each day. At each meal, try to fill half of your plate with fruits and vegetables. ? Up to 6-8 servings of whole grains each day. ? Less than 6 oz of lean meat, poultry, or fish each day. A 3-oz serving of meat is about the same size as a deck of cards. One egg equals 1 oz. ? 2 servings of low-fat dairy each day. ? A serving of nuts, seeds, or beans 5 times each week. ? Heart-healthy fats. Healthy fats called Omega-3 fatty acids are found in foods such as flaxseeds and coldwater fish, like sardines, salmon, and mackerel.  Limit how much you eat of the following: ? Canned or prepackaged foods. ? Food that is high in trans fat, such as fried foods. ? Food that is high in saturated fat, such as fatty  meat. ? Sweets, desserts, sugary drinks, and other foods with added sugar. ? Full-fat dairy products.  Do not salt foods before eating.  Try to eat at least 2 vegetarian meals each week.  Eat more home-cooked food and less restaurant, buffet, and fast food.  When eating at a restaurant, ask that your food be prepared with less salt or no salt, if possible. What foods are recommended? The items listed may not be a complete list. Talk with your dietitian about what dietary choices are best for you. Grains Whole-grain or whole-wheat bread. Whole-grain or whole-wheat pasta. Brown rice. Oatmeal. Quinoa. Bulgur. Whole-grain and low-sodium cereals. Pita bread. Low-fat, low-sodium crackers. Whole-wheat flour tortillas. Vegetables Fresh or frozen vegetables (raw, steamed, roasted, or grilled). Low-sodium or reduced-sodium tomato and vegetable juice. Low-sodium or reduced-sodium tomato sauce and tomato paste. Low-sodium or reduced-sodium canned vegetables. Fruits All fresh, dried, or frozen fruit. Canned fruit in natural juice (without added sugar). Meat and other protein foods Skinless chicken or turkey. Ground chicken or turkey. Pork with fat trimmed off. Fish and seafood. Egg   whites. Dried beans, peas, or lentils. Unsalted nuts, nut butters, and seeds. Unsalted canned beans. Lean cuts of beef with fat trimmed off. Low-sodium, lean deli meat. Dairy Low-fat (1%) or fat-free (skim) milk. Fat-free, low-fat, or reduced-fat cheeses. Nonfat, low-sodium ricotta or cottage cheese. Low-fat or nonfat yogurt. Low-fat, low-sodium cheese. Fats and oils Soft margarine without trans fats. Vegetable oil. Low-fat, reduced-fat, or light mayonnaise and salad dressings (reduced-sodium). Canola, safflower, olive, soybean, and sunflower oils. Avocado. Seasoning and other foods Herbs. Spices. Seasoning mixes without salt. Unsalted popcorn and pretzels. Fat-free sweets. What foods are not recommended? The items listed  may not be a complete list. Talk with your dietitian about what dietary choices are best for you. Grains Baked goods made with fat, such as croissants, muffins, or some breads. Dry pasta or rice meal packs. Vegetables Creamed or fried vegetables. Vegetables in a cheese sauce. Regular canned vegetables (not low-sodium or reduced-sodium). Regular canned tomato sauce and paste (not low-sodium or reduced-sodium). Regular tomato and vegetable juice (not low-sodium or reduced-sodium). Pickles. Olives. Fruits Canned fruit in a light or heavy syrup. Fried fruit. Fruit in cream or butter sauce. Meat and other protein foods Fatty cuts of meat. Ribs. Fried meat. Bacon. Sausage. Bologna and other processed lunch meats. Salami. Fatback. Hotdogs. Bratwurst. Salted nuts and seeds. Canned beans with added salt. Canned or smoked fish. Whole eggs or egg yolks. Chicken or turkey with skin. Dairy Whole or 2% milk, cream, and half-and-half. Whole or full-fat cream cheese. Whole-fat or sweetened yogurt. Full-fat cheese. Nondairy creamers. Whipped toppings. Processed cheese and cheese spreads. Fats and oils Butter. Stick margarine. Lard. Shortening. Ghee. Bacon fat. Tropical oils, such as coconut, palm kernel, or palm oil. Seasoning and other foods Salted popcorn and pretzels. Onion salt, garlic salt, seasoned salt, table salt, and sea salt. Worcestershire sauce. Tartar sauce. Barbecue sauce. Teriyaki sauce. Soy sauce, including reduced-sodium. Steak sauce. Canned and packaged gravies. Fish sauce. Oyster sauce. Cocktail sauce. Horseradish that you find on the shelf. Ketchup. Mustard. Meat flavorings and tenderizers. Bouillon cubes. Hot sauce and Tabasco sauce. Premade or packaged marinades. Premade or packaged taco seasonings. Relishes. Regular salad dressings. Where to find more information:  National Heart, Lung, and Blood Institute: www.nhlbi.nih.gov  American Heart Association: www.heart.org Summary  The DASH  eating plan is a healthy eating plan that has been shown to reduce high blood pressure (hypertension). It may also reduce your risk for type 2 diabetes, heart disease, and stroke.  With the DASH eating plan, you should limit salt (sodium) intake to 2,300 mg a day. If you have hypertension, you may need to reduce your sodium intake to 1,500 mg a day.  When on the DASH eating plan, aim to eat more fresh fruits and vegetables, whole grains, lean proteins, low-fat dairy, and heart-healthy fats.  Work with your health care provider or diet and nutrition specialist (dietitian) to adjust your eating plan to your individual calorie needs. This information is not intended to replace advice given to you by your health care provider. Make sure you discuss any questions you have with your health care provider. Document Released: 10/03/2011 Document Revised: 10/07/2016 Document Reviewed: 10/07/2016 Elsevier Interactive Patient Education  2018 Elsevier Inc.  

## 2018-08-04 NOTE — Progress Notes (Addendum)
Patient: Laurie Kirby Female    DOB: [birth year]   57 y.o.   MRN: [id number] Visit Date: 08/09/2018  Today's Provider: Kallie Locks, FNP   Chief Complaint  Patient presents with  . Follow-up    hypertension   Subjective:    HPI  Laurie Kirby is a 57 year old female with a past medical history of Hypertension, Hyperlipidemia, and Diabetes. She is here for follow up.   Current Status: Since her last office visit, she is doing well with no complaints. She denies visual changes, chest pain, cough, shortness of breath, heart palpitations, and falls. She has occasionally headaches and dizziness with position changes. Denies severe headaches, confusion, seizures, double vision, and blurred vision, nausea and vomiting.  She denies fevers, chills, fatigue, recent infections, weight loss, and night sweats. No reports of GI problems such as diarrhea, and constipation. She has no reports of blood in stools, dysuria and hematuria. No depression or anxiety, and denies suicidal ideations, homicidal ideations, or auditory hallucinations. She denies pain today.   No Known Allergies Previous Medications   ASPIRIN 81 MG TABLET    Take 81 mg by mouth daily.   ATORVASTATIN (LIPITOR) 40 MG TABLET    Take 1 tablet (40 mg total) by mouth daily.   CLOPIDOGREL (PLAVIX) 75 MG TABLET    Take 1 tablet (75 mg total) by mouth daily.   FAMOTIDINE (PEPCID) 20 MG TABLET    Take 1 tablet (20 mg total) by mouth 2 (two) times daily.   GLIPIZIDE (GLUCOTROL) 10 MG TABLET    Take 2 tablets (20 mg total) by mouth daily before breakfast.   IBUPROFEN (ADVIL,MOTRIN) 800 MG TABLET    Take 1 tablet (800 mg total) by mouth every 8 (eight) hours as needed.   ISOSORBIDE MONONITRATE (IMDUR) 30 MG 24 HR TABLET    Take 1 tablet (30 mg total) by mouth daily.   LISINOPRIL (PRINIVIL,ZESTRIL) 40 MG TABLET    Take 1 tablet (40 mg total) by mouth daily.   METFORMIN (GLUCOPHAGE) 1000 MG TABLET    Take 1 tablet (1,000 mg total) by mouth 2 (two)  times daily with a meal.   METOPROLOL TARTRATE (LOPRESSOR) 50 MG TABLET    Take 1 tablet (50 mg total) by mouth 2 (two) times daily.   MOMETASONE (ELOCON) 0.1 % OINTMENT    Apply topically daily.   Review of Systems  Eyes: Negative.   Respiratory: Negative.   Cardiovascular: Negative.   Gastrointestinal: Negative.   Musculoskeletal: Negative.   Psychiatric/Behavioral: Negative.    Social History   Tobacco Use  . Smoking status: Former Smoker    Last attempt to quit: 12/07/2013    Years since quitting: 4.6  . Smokeless tobacco: Never Used  Substance Use Topics  . Alcohol use: Yes    Alcohol/week: 4.0 standard drinks    Types: 4 Glasses of wine per week    Comment:  wine on the weekends   Objective:   BP (!) 180/86   Pulse 77   Temp 98.3 F (36.8 C)   Ht 5\' 3"  (1.6 m)   Wt 158 lb 12.8 oz (72 kg)   BMI 28.13 kg/m   Physical Exam  Constitutional: She is oriented to person, place, and time. She appears well-developed and well-nourished.  HENT:  Head: Normocephalic and atraumatic.  Neck: Normal range of motion. Neck supple.  Cardiovascular: Normal rate, regular rhythm, normal heart sounds and intact distal pulses.  Pulmonary/Chest: Effort normal and breath sounds  normal.  Abdominal: Soft. Bowel sounds are normal.  Musculoskeletal: Normal range of motion.  Neurological: She is alert and oriented to person, place, and time.  Skin: Skin is warm and dry.  Psychiatric: She has a normal mood and affect. Her behavior is normal. Judgment and thought content normal.  Nursing note and vitals reviewed.  Assessment & Plan:   1. Essential hypertension Blood pressure is elevated today. She will continue blood pressure medications as prescribed. She will continue to decrease high sodium intake, excessive alcohol intake, increase potassium intake, smoking cessation, and increase physical activity of at least 30 minutes of cardio activity daily. She will continue to follow Heart Healthy  or DASH diet.  2. Type 2 diabetes mellitus without complication, without long-term current use of insulin (HCC) Hgb A1c improved. Continue Glucotrol and Metformin as prescribed. She will continue to decrease foods/beverages high in sugars and carbs and follow Heart Healthy or DASH diet. Increase physical activity to at least 30 minutes cardio exercise daily.   3. Follow up She will follow up in 1 month to re-assess hypertension.  No orders of the defined types were placed in this encounter.   Raliegh Ip,  MSN, FNP-C [facility] [address] [place], [place] [address] [phone]

## 2018-08-24 ENCOUNTER — Telehealth: Payer: Self-pay | Admitting: Pharmacy Technician

## 2018-08-24 NOTE — Telephone Encounter (Signed)
Patient failed to provide 2019 poi.  No additional medication assistance will be provided by [facility] without the required proof of income documentation.  Patient notified by letter.  Joseantonio Dittmar J. Slater Mcmanaman Care Manager [facility] 

## 2018-09-01 ENCOUNTER — Ambulatory Visit: Payer: Self-pay | Admitting: Family Medicine

## 2018-09-01 VITALS — BP 155/85 | HR 86 | Temp 98.0°F | Ht 65.25 in | Wt 157.3 lb

## 2018-09-01 DIAGNOSIS — I10 Essential (primary) hypertension: Secondary | ICD-10-CM

## 2018-09-01 DIAGNOSIS — Z09 Encounter for follow-up examination after completed treatment for conditions other than malignant neoplasm: Secondary | ICD-10-CM

## 2018-09-01 DIAGNOSIS — E119 Type 2 diabetes mellitus without complications: Secondary | ICD-10-CM

## 2018-09-01 DIAGNOSIS — B379 Candidiasis, unspecified: Secondary | ICD-10-CM

## 2018-09-01 MED ORDER — FLUCONAZOLE 100 MG PO TABS: 100.0000 mg | ORAL_TABLET | Freq: Once | ORAL | 0 refills | Status: AC

## 2018-09-01 NOTE — Progress Notes (Signed)
Follow Up  Subjective:    Patient ID: Laurie Kirby, female    DOB: 10/08/1961, 57 y.o.   MRN: [id number]   Chief Complaint  Patient presents with  . Follow-up    Blood pressure   HPI  Ms. Domangue is a 57 year old female with a past medical history of Hypertension, Hyperlipidemia, and Diabetes. She is here today for follow up.   Current Status: Since her last office visit, she is doing well with no complaints. She had not been taking her medications since 08/21/2018. She begin taking all medications today. She denies visual changes, chest pain, cough, shortness of breath, heart palpitations, and falls. She has occasionally headaches and dizziness with position changes. Denies severe headaches, confusion, seizures, double vision, and blurred vision, nausea and vomiting. She denies frequent urination, hunger, fatigue, blurred vision, excessive hunger, excessive thirst, weight gain, weight loss, and poor wound healing. She states that she believes she has a yeast infection today. She reports vaginal discharge and itching.   She denies fevers, chills, recent infections, weight loss, and night sweats. She has not had any falls. No reports of GI problems such as diarrhea, and constipation. She has no reports of blood in stools, dysuria and hematuria. No depression or anxiety, reported. She denies pain today.   Past Medical History:  Diagnosis Date  . Diabetes mellitus without complication (HCC)   . Hyperlipidemia   . Hypertension     Family History  Problem Relation Age of Onset  . Diabetes type II Mother   . Diabetes type II Father   . Heart disease Father   . Cancer Maternal Aunt   . Breast cancer Cousin     Social History   Socioeconomic History  . Marital status: Single    Spouse name: Not on file  . Number of children: 3  . Years of education: Not on file  . Highest education level: High school graduate  Occupational History  . Occupation: unemployed  Social Needs  .  Financial resource strain: Not very hard  . Food insecurity:    Worry: Never true    Inability: Never true  . Transportation needs:    Medical: No    Non-medical: No  Tobacco Use  . Smoking status: Former Smoker    Last attempt to quit: 12/07/2013    Years since quitting: 4.7  . Smokeless tobacco: Never Used  Substance and Sexual Activity  . Alcohol use: Yes    Alcohol/week: 4.0 standard drinks    Types: 4 Glasses of wine per week    Comment:  wine on the weekends  . Drug use: No  . Sexual activity: Yes  Lifestyle  . Physical activity:    Days per week: 0 days    Minutes per session: Not on file  . Stress: Not at all  Relationships  . Social connections:    Talks on phone: More than three times a week    Gets together: Once a week    Attends religious service: More than 4 times per year    Active member of club or organization: Yes    Attends meetings of clubs or organizations: More than 4 times per year    Relationship status: Living with partner  . Intimate partner violence:    Fear of current or ex partner: No    Emotionally abused: No    Physically abused: No    Forced sexual activity: No  Other Topics Concern  . Not on file  Social History Narrative   A friend supports. Not on food stamps. Pretty well taken care of by friend.     Past Surgical History:  Procedure Laterality Date  . CORONARY ANGIOPLASTY WITH STENT PLACEMENT      Immunization History  Administered Date(s) Administered  . Influenza-Unspecified 08/03/2015, 08/09/2016, 08/07/2017, 07/24/2018    Current Meds  Medication Sig  . aspirin 81 MG tablet Take 81 mg by mouth daily.  Marland Kitchen atorvastatin (LIPITOR) 40 MG tablet Take 1 tablet (40 mg total) by mouth daily.  . clopidogrel (PLAVIX) 75 MG tablet Take 1 tablet (75 mg total) by mouth daily.  . famotidine (PEPCID) 20 MG tablet Take 1 tablet (20 mg total) by mouth 2 (two) times daily.  Marland Kitchen glipiZIDE (GLUCOTROL) 10 MG tablet Take 2 tablets (20 mg total)  by mouth daily before breakfast.  . ibuprofen (ADVIL,MOTRIN) 800 MG tablet Take 1 tablet (800 mg total) by mouth every 8 (eight) hours as needed.  . isosorbide mononitrate (IMDUR) 30 MG 24 hr tablet Take 1 tablet (30 mg total) by mouth daily.  Marland Kitchen lisinopril (PRINIVIL,ZESTRIL) 40 MG tablet Take 1 tablet (40 mg total) by mouth daily.  . metFORMIN (GLUCOPHAGE) 1000 MG tablet Take 1 tablet (1,000 mg total) by mouth 2 (two) times daily with a meal.  . metoprolol tartrate (LOPRESSOR) 50 MG tablet Take 1 tablet (50 mg total) by mouth 2 (two) times daily.  . mometasone (ELOCON) 0.1 % ointment Apply topically daily.    No Known Allergies  BP (!) 155/85   Pulse 86   Temp 98 F (36.7 C)   Ht 5' 5.25" (1.657 m)   Wt 157 lb 4.8 oz (71.4 kg)   BMI 25.98 kg/m     Review of Systems  Constitutional: Negative.   HENT: Negative.   Respiratory: Negative.   Cardiovascular: Negative.   Gastrointestinal: Negative.   Genitourinary: Negative.   Musculoskeletal: Negative.   Skin: Negative.   Allergic/Immunologic: Negative.   Neurological: Positive for dizziness (Occasional ) and headaches (Occasional).  Psychiatric/Behavioral: Negative.    Objective:   Physical Exam  Constitutional: She is oriented to person, place, and time. She appears well-developed and well-nourished.  HENT:  Head: Normocephalic and atraumatic.  Eyes: Pupils are equal, round, and reactive to light. EOM are normal.  Neck: Normal range of motion. Neck supple.  Cardiovascular: Normal rate, regular rhythm, normal heart sounds and intact distal pulses.  Pulmonary/Chest: Effort normal and breath sounds normal.  Abdominal: Soft. Bowel sounds are normal.  Musculoskeletal: Normal range of motion.  Neurological: She is alert and oriented to person, place, and time.  Skin: Skin is warm and dry.  Psychiatric: She has a normal mood and affect. Her behavior is normal. Judgment and thought content normal.  Nursing note and vitals  reviewed.  Assessment & Plan:   1. Essential hypertension Blood pressure is 155/85 today. She will re-start taking antihypertensive medications as prescribed. She will continue to decrease high sodium intake, excessive alcohol intake, increase potassium intake, smoking cessation, and increase physical activity of at least 30 minutes of cardio activity daily. She will continue to follow Heart Healthy or DASH diet.  2. Type 2 diabetes mellitus without complication, without long-term current use of insulin (HCC) Hgb A1c decreased at 9.4 on 03/05/2018, from 10.5 on 10.5. She will continue to decrease foods/beverages high in sugars and carbs and follow Heart Healthy or DASH diet. Increase physical activity to at least 30 minutes cardio exercise daily.   3. Yeast  infection We will initiate one dose of Diflucan today.  - fluconazole (DIFLUCAN) 100 MG tablet; Take 1 tablet (100 mg total) by mouth once for 1 dose.  Dispense: 1 tablet; Refill: 0  4. Follow up She will follow up in 3 months.   Meds ordered this encounter  Medications  . fluconazole (DIFLUCAN) 100 MG tablet    Sig: Take 1 tablet (100 mg total) by mouth once for 1 dose.    Dispense:  1 tablet    Refill:  0   Raliegh Ip,  MSN, FNP-C [facility] [address] [place], [place] [address] [phone]

## 2018-11-02 ENCOUNTER — Other Ambulatory Visit: Payer: Self-pay | Admitting: Family Medicine

## 2018-11-02 ENCOUNTER — Other Ambulatory Visit: Payer: Self-pay | Admitting: Adult Health Nurse Practitioner

## 2018-11-02 DIAGNOSIS — E119 Type 2 diabetes mellitus without complications: Secondary | ICD-10-CM

## 2018-11-02 DIAGNOSIS — E785 Hyperlipidemia, unspecified: Secondary | ICD-10-CM

## 2018-11-02 DIAGNOSIS — I25119 Atherosclerotic heart disease of native coronary artery with unspecified angina pectoris: Secondary | ICD-10-CM

## 2018-12-01 ENCOUNTER — Ambulatory Visit: Payer: Self-pay | Admitting: Adult Health Nurse Practitioner

## 2018-12-01 DIAGNOSIS — I10 Essential (primary) hypertension: Secondary | ICD-10-CM | POA: Insufficient documentation

## 2018-12-01 DIAGNOSIS — I25119 Atherosclerotic heart disease of native coronary artery with unspecified angina pectoris: Secondary | ICD-10-CM

## 2018-12-01 DIAGNOSIS — E119 Type 2 diabetes mellitus without complications: Secondary | ICD-10-CM

## 2018-12-01 DIAGNOSIS — M25551 Pain in right hip: Secondary | ICD-10-CM

## 2018-12-01 DIAGNOSIS — E785 Hyperlipidemia, unspecified: Secondary | ICD-10-CM

## 2018-12-01 MED ORDER — FAMOTIDINE 20 MG PO TABS: 20.0000 mg | ORAL_TABLET | Freq: Two times a day (BID) | ORAL | 6 refills | Status: DC

## 2018-12-01 MED ORDER — ISOSORBIDE MONONITRATE ER 30 MG PO TB24: 30.0000 mg | ORAL_TABLET | Freq: Every day | ORAL | 1 refills | Status: DC

## 2018-12-01 MED ORDER — METOPROLOL TARTRATE 50 MG PO TABS: 50.0000 mg | ORAL_TABLET | Freq: Two times a day (BID) | ORAL | 1 refills | Status: DC

## 2018-12-01 MED ORDER — LISINOPRIL 40 MG PO TABS: 40.0000 mg | ORAL_TABLET | Freq: Every day | ORAL | 3 refills | Status: DC

## 2018-12-01 MED ORDER — ASPIRIN 81 MG PO TABS: 81.0000 mg | ORAL_TABLET | Freq: Every day | ORAL | 6 refills | Status: DC

## 2018-12-01 MED ORDER — METFORMIN HCL 1000 MG PO TABS: 1000.0000 mg | ORAL_TABLET | Freq: Two times a day (BID) | ORAL | 1 refills | Status: DC

## 2018-12-01 MED ORDER — ATORVASTATIN CALCIUM 40 MG PO TABS: ORAL_TABLET | ORAL | 1 refills | Status: DC

## 2018-12-01 MED ORDER — GLIPIZIDE 10 MG PO TABS: ORAL_TABLET | ORAL | 1 refills | Status: DC

## 2018-12-01 MED ORDER — IBUPROFEN 800 MG PO TABS: 800.0000 mg | ORAL_TABLET | Freq: Three times a day (TID) | ORAL | 1 refills | Status: DC | PRN

## 2018-12-01 MED ORDER — CLOPIDOGREL BISULFATE 75 MG PO TABS: 75.0000 mg | ORAL_TABLET | Freq: Every day | ORAL | 1 refills | Status: DC

## 2018-12-01 NOTE — Progress Notes (Signed)
Patient: Laurie Kirby Female    DOB: 02-08-1961   58 y.o.   MRN: [id number] Visit Date: 12/01/2018  Today's Provider: [facility]   Chief Complaint  Patient presents with  . Follow-up   Subjective:    HPI   Pt states that she has 3 days worth of medications left and has been "saving" it until she feels bad.   Last A1c 9.4 LDL-151 both in May 2019.    No Known Allergies Previous Medications   ASPIRIN 81 MG TABLET    Take 81 mg by mouth daily.   ATORVASTATIN (LIPITOR) 40 MG TABLET    TAKE 1 TABLET (40MG  TOTAL) BY MOUTH DAILY   CLOPIDOGREL (PLAVIX) 75 MG TABLET    TAKE 1 TABLET BY MOUTH DAILY   FAMOTIDINE (PEPCID) 20 MG TABLET    Take 1 tablet (20 mg total) by mouth 2 (two) times daily.   GLIPIZIDE (GLUCOTROL) 10 MG TABLET    TAKE 2 TABLETS BY MOUTH EVERY DAY BEFOREBREAKFAST   IBUPROFEN (ADVIL,MOTRIN) 800 MG TABLET    Take 1 tablet (800 mg total) by mouth every 8 (eight) hours as needed.   ISOSORBIDE MONONITRATE (IMDUR) 30 MG 24 HR TABLET    TAKE 1 TABLET BY MOUTH DAILY   LISINOPRIL (PRINIVIL,ZESTRIL) 40 MG TABLET    Take 1 tablet (40 mg total) by mouth daily.   METFORMIN (GLUCOPHAGE) 1000 MG TABLET    Take 1 tablet (1,000 mg total) by mouth 2 (two) times daily with a meal.   METOPROLOL TARTRATE (LOPRESSOR) 50 MG TABLET    Take 1 tablet (50 mg total) by mouth 2 (two) times daily.   MOMETASONE (ELOCON) 0.1 % OINTMENT    Apply topically daily.    Review of Systems  All other systems reviewed and are negative.   Social History   Tobacco Use  . Smoking status: Former Smoker    Last attempt to quit: 12/07/2013    Years since quitting: 4.9  . Smokeless tobacco: Never Used  Substance Use Topics  . Alcohol use: Yes    Alcohol/week: 4.0 standard drinks    Types: 4 Glasses of wine per week    Comment:  wine on the weekends   Objective:   BP (!) 170/92 (BP Location: Right Arm, Patient Position: Sitting, Cuff Size: Normal)   Pulse (!) 103   Temp 98.7 F (37.1 C)  (Oral)   Ht 5\' 3"  (1.6 m)   Wt 156 lb 8 oz (71 kg)   BMI 27.72 kg/m   Physical Exam Vitals signs reviewed.  Constitutional:      Appearance: Normal appearance.  HENT:     Head: Normocephalic and atraumatic.  Neck:     Musculoskeletal: Normal range of motion and neck supple.  Cardiovascular:     Rate and Rhythm: Normal rate and regular rhythm.     Pulses: Normal pulses.  Pulmonary:     Effort: Pulmonary effort is normal.     Breath sounds: Normal breath sounds.  Abdominal:     General: Bowel sounds are normal.     Palpations: Abdomen is soft.  Neurological:     Mental Status: She is alert.         Assessment & Plan:         HTN:   Not controlled.  Goal BP <140/80.  Resume current medication regimen.  Encourage low salt diet and exercise.  FU in 4 weeks for BP check.   HLD:  Lipid panel tonight  Continue current regimen.  Encourage low cholesterol, low fat diet and exercise.   DM:  A1c tonight.  Not controlled.  Encourage diabetic diet and exercise.  Continue current medication regimen.        [facility]   [facility]

## 2018-12-02 LAB — COMPREHENSIVE METABOLIC PANEL
ALT: 13 IU/L (ref 0–32)
AST: 15 IU/L (ref 0–40)
Albumin/Globulin Ratio: 1.2 (ref 1.2–2.2)
Albumin: 4.2 g/dL (ref 3.8–4.9)
Alkaline Phosphatase: 77 IU/L (ref 39–117)
BUN/Creatinine Ratio: 13 (ref 9–23)
BUN: 8 mg/dL (ref 6–24)
Bilirubin Total: <0.2 mg/dL (ref 0.0–1.2)
CALCIUM: 9.7 mg/dL (ref 8.7–10.2)
CO2: 24 mmol/L (ref 20–29)
Chloride: 100 mmol/L (ref 96–106)
Creatinine, Ser: 0.62 mg/dL (ref 0.57–1.00)
GFR, EST AFRICAN AMERICAN: 117 mL/min/{1.73_m2} (ref >59)
GFR, EST NON AFRICAN AMERICAN: 101 mL/min/{1.73_m2} (ref >59)
Globulin, Total: 3.5 g/dL (ref 1.5–4.5)
Glucose: 234 mg/dL — ABNORMAL HIGH (ref 65–99)
Potassium: 4.3 mmol/L (ref 3.5–5.2)
Sodium: 143 mmol/L (ref 134–144)
Total Protein: 7.7 g/dL (ref 6.0–8.5)

## 2018-12-02 LAB — CBC
Hematocrit: 45 % (ref 34.0–46.6)
Hemoglobin: 15.4 g/dL (ref 11.1–15.9)
MCH: 30.4 pg (ref 26.6–33.0)
MCHC: 34.2 g/dL (ref 31.5–35.7)
MCV: 89 fL (ref 79–97)
Platelets: 350 10*3/uL (ref 150–450)
RBC: 5.06 x10E6/uL (ref 3.77–5.28)
RDW: 12.1 % (ref 11.7–15.4)
WBC: 16.1 10*3/uL — ABNORMAL HIGH (ref 3.4–10.8)

## 2018-12-02 LAB — LIPID PANEL
Chol/HDL Ratio: 5.9 ratio — ABNORMAL HIGH (ref 0.0–4.4)
Cholesterol, Total: 247 mg/dL — ABNORMAL HIGH (ref 100–199)
HDL: 42 mg/dL (ref >39)
LDL Calculated: 159 mg/dL — ABNORMAL HIGH (ref 0–99)
Triglycerides: 229 mg/dL — ABNORMAL HIGH (ref 0–149)
VLDL Cholesterol Cal: 46 mg/dL — ABNORMAL HIGH (ref 5–40)

## 2018-12-02 LAB — MICROALBUMIN / CREATININE URINE RATIO
Creatinine, Urine: 280.9 mg/dL
Microalb/Creat Ratio: 554 mg/g creat — ABNORMAL HIGH (ref 0–29)
Microalbumin, Urine: 1556.2 ug/mL

## 2018-12-02 LAB — HEMOGLOBIN A1C
Est. average glucose Bld gHb Est-mCnc: 269 mg/dL
Hgb A1c MFr Bld: 11 % — ABNORMAL HIGH (ref 4.8–5.6)

## 2018-12-21 IMAGING — CR DG HIP (WITH OR WITHOUT PELVIS) 2-3V*R*
1 series · 3 of 3 positions shown · non-contrast
Comparison: No recent prior.

CLINICAL DATA: Right hip pain.  Fall.  Limited range of motion.

EXAM:
DG HIP (WITH OR WITHOUT PELVIS) 2-3V RIGHT

[Series 1: dg hip unilat w or w/o pelvis 2-3 views  · non-contrast · 0.14mm/px · 3 of 3 slices shown]
[im 1/3]
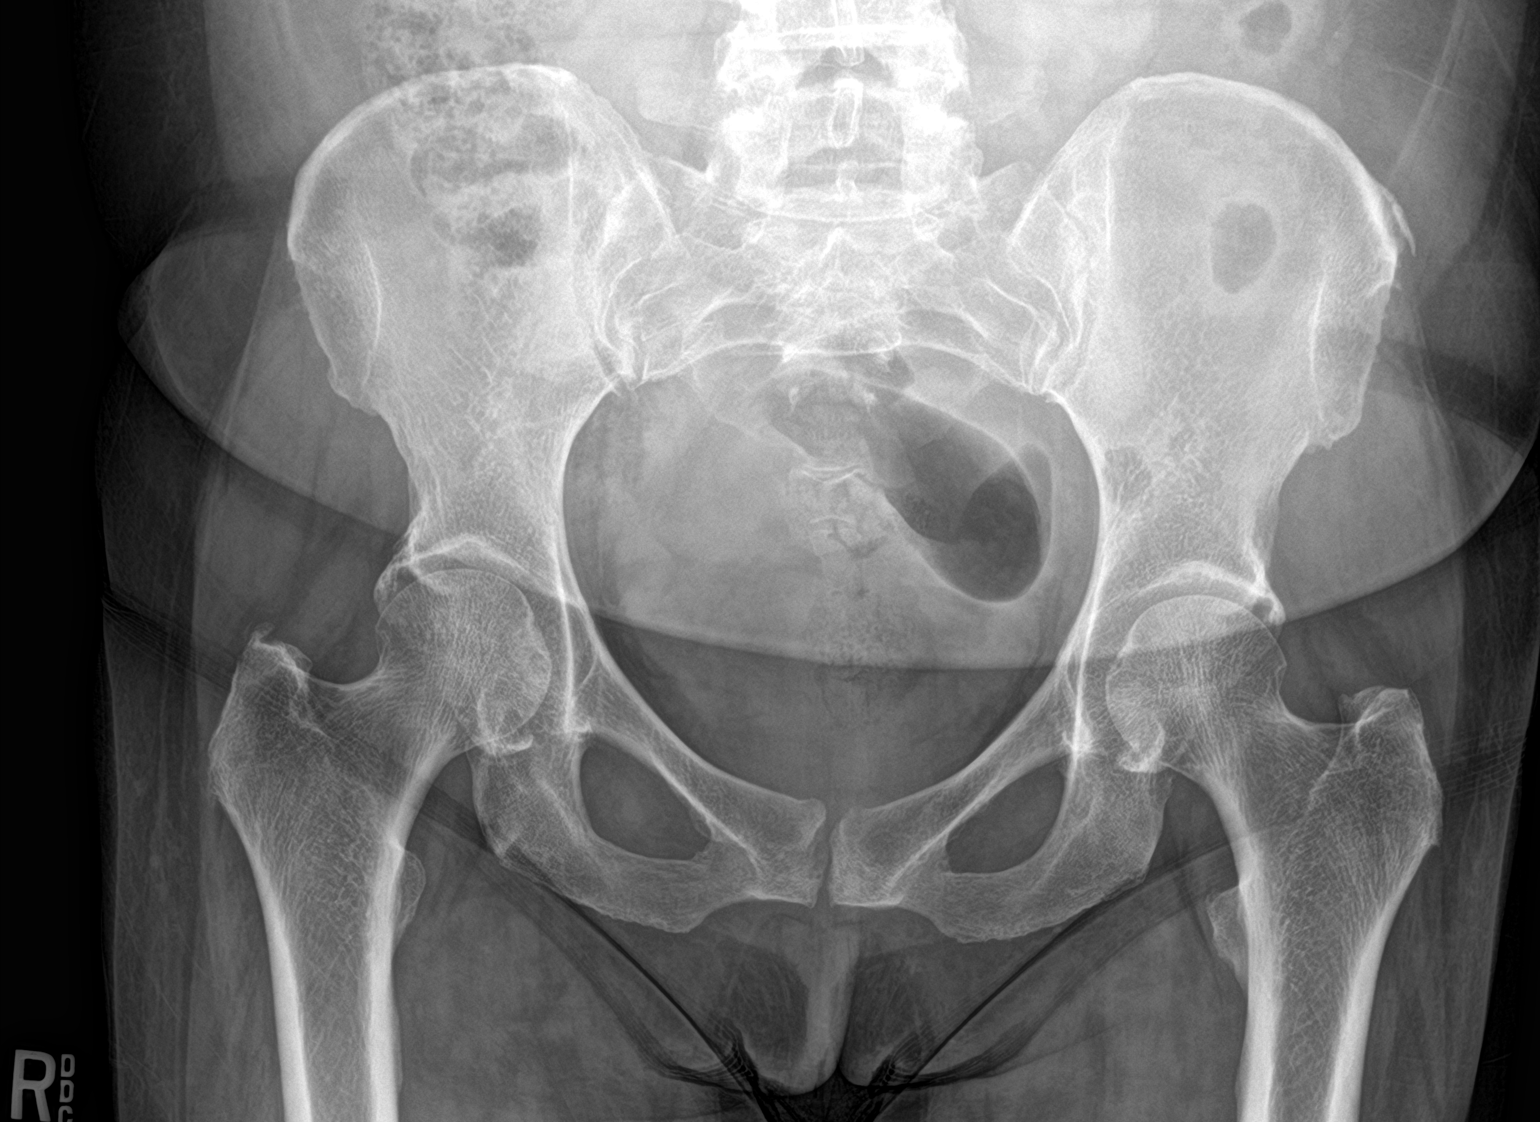
[im 2/3]
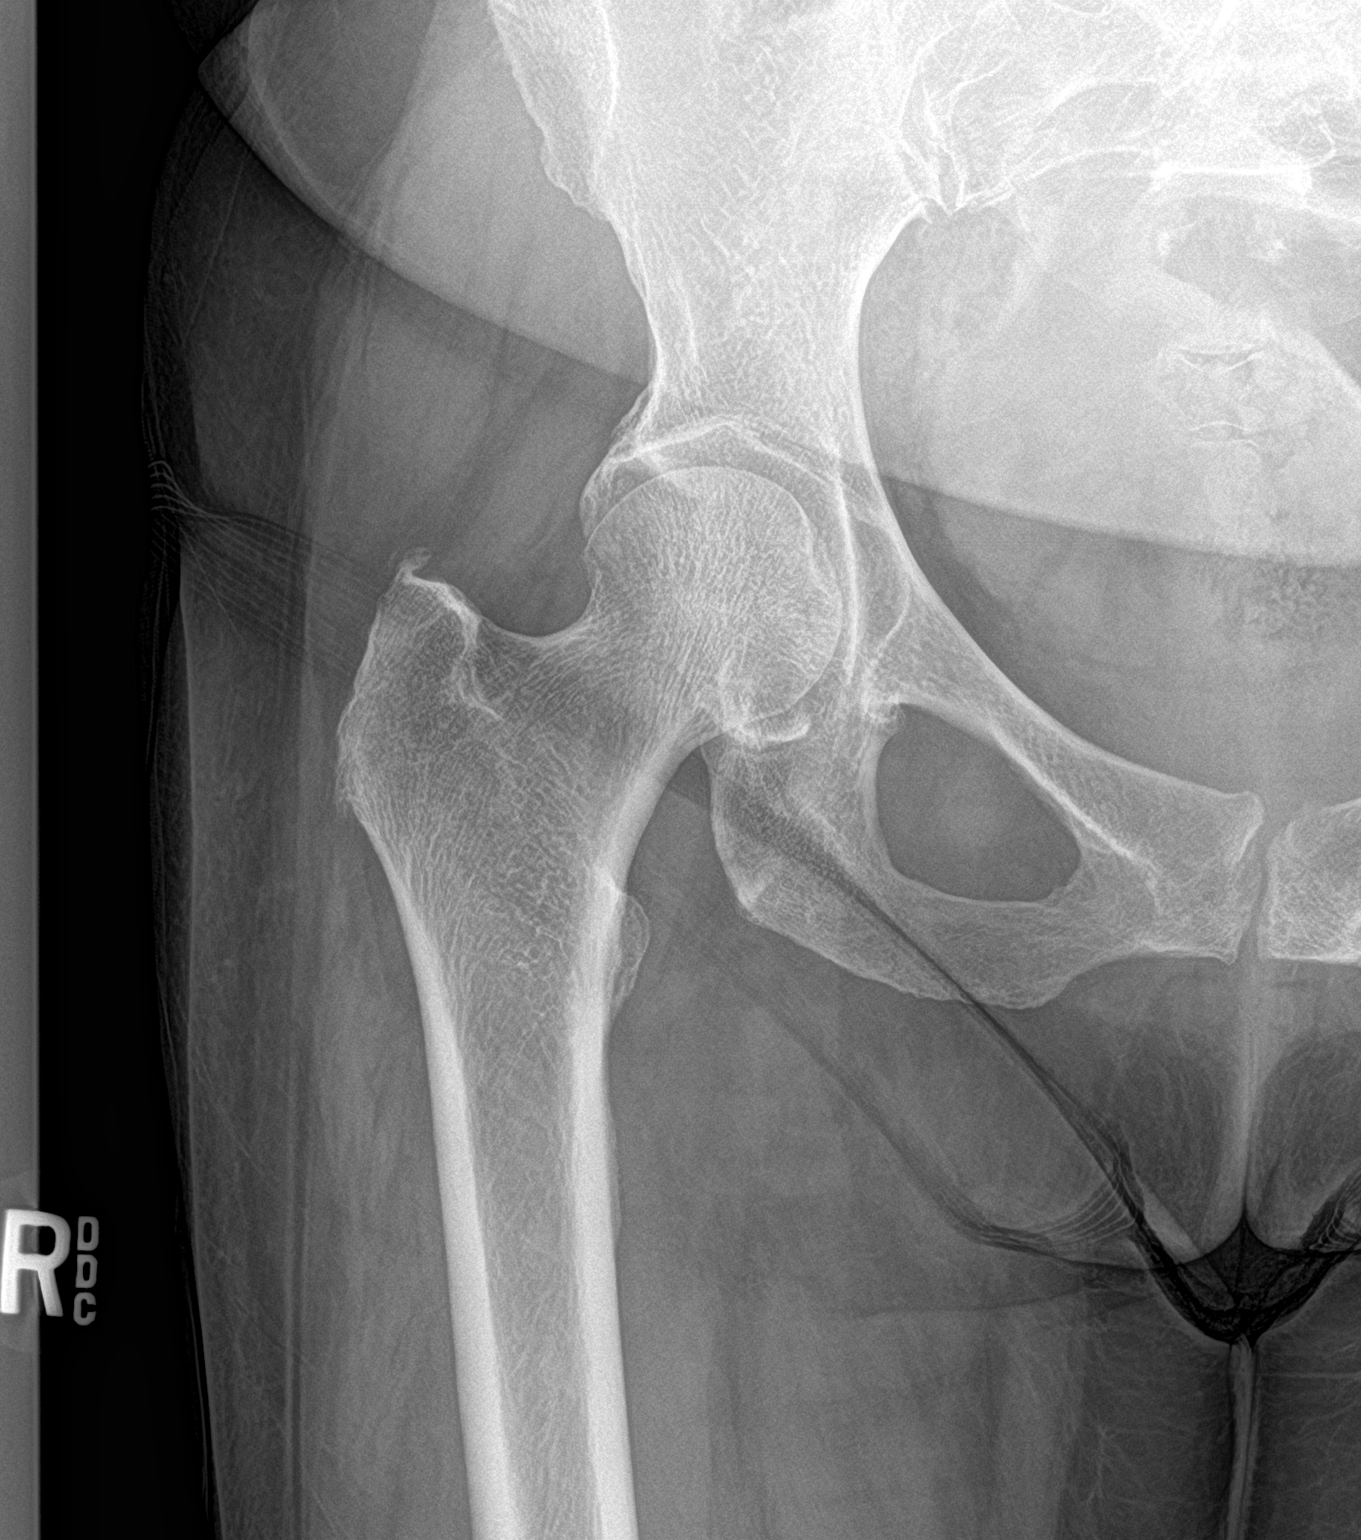
[im 3/3]
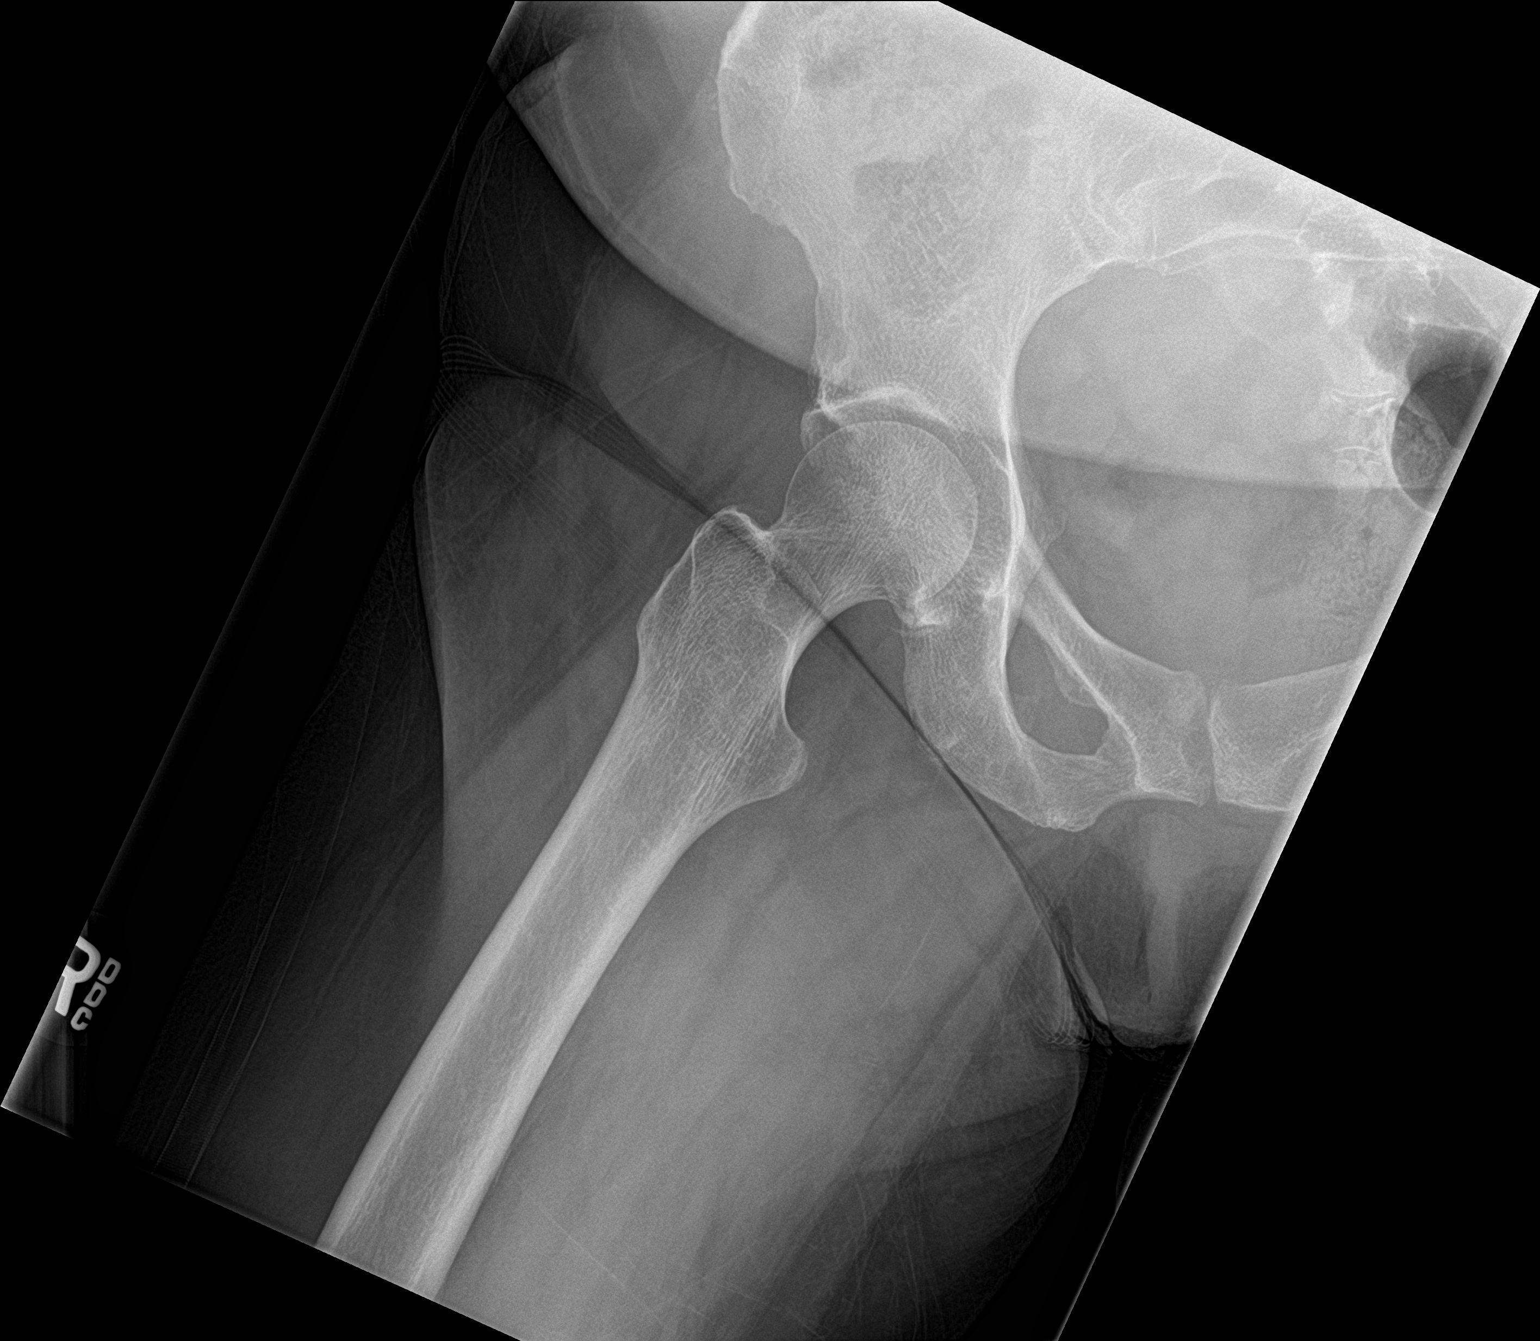

[3 of 3 positions shown; findings below may reference images not displayed]

FINDINGS: Degenerative changes lower lumbar spine and both hips. No acute bony
or joint abnormality identified. No evidence of fracture or
dislocation.
IMPRESSION: Degenerative changes lower lumbar spine and both hips. No acute
abnormality.

## 2018-12-29 ENCOUNTER — Ambulatory Visit: Payer: Self-pay | Admitting: Internal Medicine

## 2018-12-29 ENCOUNTER — Other Ambulatory Visit: Payer: Self-pay

## 2018-12-29 VITALS — BP 155/78 | Temp 98.2°F | Ht 63.0 in | Wt 157.8 lb

## 2018-12-29 DIAGNOSIS — F5102 Adjustment insomnia: Secondary | ICD-10-CM

## 2018-12-29 DIAGNOSIS — I25119 Atherosclerotic heart disease of native coronary artery with unspecified angina pectoris: Secondary | ICD-10-CM

## 2018-12-29 DIAGNOSIS — E119 Type 2 diabetes mellitus without complications: Secondary | ICD-10-CM

## 2018-12-29 DIAGNOSIS — I10 Essential (primary) hypertension: Secondary | ICD-10-CM

## 2018-12-29 MED ORDER — MOMETASONE FUROATE 0.1 % EX OINT: TOPICAL_OINTMENT | Freq: Every day | CUTANEOUS | 3 refills | Status: DC

## 2018-12-29 NOTE — Patient Instructions (Signed)
Continue lisinopril and metoprolol for blood pressure.   Continue glipizide and metformin for blood sugar.  Have HbA1c checked before next visit in 8 weeks. Refill for the mometasone ointment for skin was sent to the pharmacy.

## 2018-12-29 NOTE — Progress Notes (Signed)
Patient: Laurie Kirby Female    DOB: 26-Sep-1961   58 y.o.   MRN: [id number] Visit Date: 12/29/2018  Today's Provider: Isa Rankin, MD   Chief Complaint  Patient presents with  . Follow-up    HTN F/U   Subjective:    HPI patient is a 58 yo F here for bp, glucose, recheck.  A friend and companion died recently and the funeral was a couple days ago.  Trouble staying asleep.  Resumed taking glipizide and metformin as well as lopressor and lisinopril after last visit.  A1c was 11 on 2/4.  Has sensitive skin and has been itchy all over, esp legs, recently.      No Known Allergies Previous Medications   ASPIRIN 81 MG TABLET    Take 1 tablet (81 mg total) by mouth daily.   ATORVASTATIN (LIPITOR) 40 MG TABLET    TAKE 1 TABLET (40MG  TOTAL) BY MOUTH DAILY   CLOPIDOGREL (PLAVIX) 75 MG TABLET    Take 1 tablet (75 mg total) by mouth daily.   FAMOTIDINE (PEPCID) 20 MG TABLET    Take 1 tablet (20 mg total) by mouth 2 (two) times daily.   GLIPIZIDE (GLUCOTROL) 10 MG TABLET    TAKE 2 TABLETS BY MOUTH EVERY DAY BEFOREBREAKFAST   IBUPROFEN (ADVIL,MOTRIN) 800 MG TABLET    Take 1 tablet (800 mg total) by mouth every 8 (eight) hours as needed.   ISOSORBIDE MONONITRATE (IMDUR) 30 MG 24 HR TABLET    Take 1 tablet (30 mg total) by mouth daily.   LISINOPRIL (PRINIVIL,ZESTRIL) 40 MG TABLET    Take 1 tablet (40 mg total) by mouth daily.   METFORMIN (GLUCOPHAGE) 1000 MG TABLET    Take 1 tablet (1,000 mg total) by mouth 2 (two) times daily with a meal.   METOPROLOL TARTRATE (LOPRESSOR) 50 MG TABLET    Take 1 tablet (50 mg total) by mouth 2 (two) times daily.   MOMETASONE (ELOCON) 0.1 % OINTMENT    Apply topically daily.    Review of Systems  All other systems reviewed and are negative.   Social History   Tobacco Use  . Smoking status: Former Smoker    Last attempt to quit: 12/07/2013    Years since quitting: 5.0  . Smokeless tobacco: Never Used  Substance Use Topics  . Alcohol use: Yes   Alcohol/week: 4.0 standard drinks    Types: 4 Glasses of wine per week    Comment:  wine on the weekends   Objective:   BP (!) 155/78   Temp 98.2 F (36.8 C)   Ht 5\' 3"  (1.6 m)   Wt 157 lb 12.8 oz (71.6 kg)   BMI 27.95 kg/m   Physical Exam Vitals signs and nursing note reviewed.  Constitutional:      General: She is not in acute distress.    Comments: Alert, nicely groomed  HENT:     Head: Atraumatic.  Eyes:     Comments: Conjugate gaze, no eye redness/drainage  Neck:     Musculoskeletal: Neck supple.  Cardiovascular:     Rate and Rhythm: Normal rate and regular rhythm.  Pulmonary:     Effort: No respiratory distress.     Breath sounds: No wheezing or rales.     Comments: Lungs clear, symmetric breath sounds  Abdominal:     General: There is no distension.  Musculoskeletal: Normal range of motion.        General: No swelling.     Comments:  No leg swelling  Skin:    General: Skin is warm and dry.     Comments: No cyanosis Skin on lower legs appears dry.  No rash apparent  Neurological:     Mental Status: She is alert and oriented to person, place, and time.         Assessment & Plan:    HTN Diabetes CAD Adjustment insomnia  Continue lisinopril and metoprolol for blood pressure.   Continue glipizide and metformin for blood sugar.  Have HbA1c checked before next visit in 8 weeks. Refill for the mometasone ointment for skin was sent to the pharmacy.        Isa Rankin, MD   [facility]

## 2019-03-02 ENCOUNTER — Other Ambulatory Visit: Payer: Self-pay

## 2019-03-04 ENCOUNTER — Other Ambulatory Visit: Payer: Self-pay

## 2019-03-04 ENCOUNTER — Encounter: Payer: Self-pay | Admitting: Urology

## 2019-03-04 ENCOUNTER — Ambulatory Visit: Payer: Self-pay | Admitting: Urology

## 2019-03-04 DIAGNOSIS — I10 Essential (primary) hypertension: Secondary | ICD-10-CM

## 2019-03-04 DIAGNOSIS — I25119 Atherosclerotic heart disease of native coronary artery with unspecified angina pectoris: Secondary | ICD-10-CM

## 2019-03-04 DIAGNOSIS — E785 Hyperlipidemia, unspecified: Secondary | ICD-10-CM

## 2019-03-04 DIAGNOSIS — E119 Type 2 diabetes mellitus without complications: Secondary | ICD-10-CM

## 2019-03-04 MED ORDER — LISINOPRIL 40 MG PO TABS: 40.0000 mg | ORAL_TABLET | Freq: Every day | ORAL | 3 refills | Status: DC

## 2019-03-04 MED ORDER — METOPROLOL TARTRATE 50 MG PO TABS: 50.0000 mg | ORAL_TABLET | Freq: Two times a day (BID) | ORAL | 1 refills | Status: DC

## 2019-03-04 MED ORDER — GLIPIZIDE 10 MG PO TABS: ORAL_TABLET | ORAL | 1 refills | Status: DC

## 2019-03-04 MED ORDER — ISOSORBIDE MONONITRATE ER 30 MG PO TB24: 30.0000 mg | ORAL_TABLET | Freq: Every day | ORAL | 1 refills | Status: DC

## 2019-03-04 MED ORDER — METFORMIN HCL 1000 MG PO TABS: 1000.0000 mg | ORAL_TABLET | Freq: Two times a day (BID) | ORAL | 1 refills | Status: DC

## 2019-03-04 MED ORDER — ATORVASTATIN CALCIUM 40 MG PO TABS: ORAL_TABLET | ORAL | 1 refills | Status: DC

## 2019-03-04 MED ORDER — FAMOTIDINE 20 MG PO TABS: 20.0000 mg | ORAL_TABLET | Freq: Two times a day (BID) | ORAL | 6 refills | Status: DC

## 2019-03-04 MED ORDER — CLOPIDOGREL BISULFATE 75 MG PO TABS: 75.0000 mg | ORAL_TABLET | Freq: Every day | ORAL | 1 refills | Status: DC

## 2019-03-04 NOTE — Progress Notes (Signed)
Virtual Visit via Telephone Note  I connected with Laurie Kirby on 03/03/2001 at 1825 by telephone and verified that I am speaking with the correct person using two identifiers.  They are located at home.   I am located at my home.    This visit type was conducted due to national recommendations for restrictions regarding the COVID-19 Pandemic (e.g. social distancing).  This format is felt to be most appropriate for this patient at this time.  All issues noted in this document were discussed and addressed.  No physical exam was performed.   I discussed the limitations, risks, security and privacy concerns of performing an evaluation and management service by telephone and the availability of in person appointments. I also discussed with the patient that there may be a patient responsible charge related to this service. The patient expressed understanding and agreed to proceed.   History of Present Illness: Laurie Kirby is a 58 year old female with HTN, DM and CAD who is contacted today for a follow up visit.    DM- has not had her HbgA1c recheck - will schedule HbgA1c next week   HTN-taking medications- cannot take BP at home  CAD- no CP.  No SOB.     Observations/Objective: She does not sound distressed.  She is answering questions appropriately.    Assessment and Plan:  1. DM - will check HbgA1c next week   2. HTN - taking medications  - will check BP next week   Follow Up Instructions:    I discussed the assessment and treatment plan with the patient. The patient was provided an opportunity to ask questions and all were answered. The patient agreed with the plan and demonstrated an understanding of the instructions.   The patient was advised to call back or seek an in-person evaluation if the symptoms worsen or if the condition fails to improve as anticipated.  I provided 5 minutes of non-face-to-face time during this encounter.   Jeraline Marcinek, PA-C

## 2019-03-09 ENCOUNTER — Ambulatory Visit: Payer: Self-pay

## 2019-03-10 ENCOUNTER — Ambulatory Visit: Payer: Self-pay

## 2019-03-10 ENCOUNTER — Other Ambulatory Visit: Payer: Self-pay

## 2019-03-10 VITALS — BP 158/87

## 2019-03-10 DIAGNOSIS — E119 Type 2 diabetes mellitus without complications: Secondary | ICD-10-CM

## 2019-03-10 LAB — POCT GLYCOSYLATED HEMOGLOBIN (HGB A1C): Hemoglobin A1C: 14 % — AB (ref 4.0–5.6)

## 2019-03-11 ENCOUNTER — Ambulatory Visit: Payer: Self-pay | Admitting: Urology

## 2019-03-11 VITALS — BP 158/87 | Wt 162.0 lb

## 2019-03-11 DIAGNOSIS — E119 Type 2 diabetes mellitus without complications: Secondary | ICD-10-CM

## 2019-03-11 NOTE — Progress Notes (Signed)
Virtual Visit via Telephone Note  I connected with Laurie Kirby on 03/11/2019 at 1929 by audio/video and verified that I am speaking with the correct person using two identifiers.  They are located at home.   I am located at my home.    This visit type was conducted due to national recommendations for restrictions regarding the COVID-19 Pandemic (e.g. social distancing).  This format is felt to be most appropriate for this patient at this time.  All issues noted in this document were discussed and addressed.  No physical exam was performed.   I discussed the limitations, risks, security and privacy concerns of performing an evaluation and management service by telephone and the availability of in person appointments. I also discussed with the patient that there may be a patient responsible charge related to this service. The patient expressed understanding and agreed to proceed.   History of Present Illness: Laurie Kirby is a 58 year old African American female with uncontrolled DM who is contacted to discuss insulin therapy.   Her HgbA1c is 14%.    I have encouraged her to start insulin, but she is resistant to that idea.  She will do better on her diet and take her medications as prescribed.  She will also check BS in the am and two hours after lunch and dinner.   She will also check her BP twice daily.        Observations/Objective:   Assessment and Plan: 1. Uncontrolled DM Strongly encouraged her to start insulin at this time and explained to her that she is at risk for DKA and it is life threatening - she begged [place] to let her try for 30 days to get her sugars down - I will give her a week with monitoring her sugars   Follow Up Instructions:    I discussed the assessment and treatment plan with the patient. The patient was provided an opportunity to ask questions and all were answered. The patient agreed with the plan and demonstrated an understanding of the instructions.   The patient  was advised to call back or seek an in-person evaluation if the symptoms worsen or if the condition fails to improve as anticipated.  I provided 20 minutes of face-to-face time during this encounter.   Laurie Howley, PA-C

## 2019-03-18 ENCOUNTER — Ambulatory Visit: Payer: Self-pay | Admitting: Urology

## 2019-03-18 ENCOUNTER — Other Ambulatory Visit: Payer: Self-pay

## 2019-03-18 DIAGNOSIS — E119 Type 2 diabetes mellitus without complications: Secondary | ICD-10-CM

## 2019-03-18 NOTE — Progress Notes (Signed)
Virtual Visit via Telephone Note  I connected with Laurie Kirby on 03/18/2019 at 1717 by telehealth and verified that I am speaking with the correct person using two identifiers.  They are located at home.   I am located at my home.    This visit type was conducted due to national recommendations for restrictions regarding the COVID-19 Pandemic (e.g. social distancing).  This format is felt to be most appropriate for this patient at this time.  All issues noted in this document were discussed and addressed.  No physical exam was performed.   I discussed the limitations, risks, security and privacy concerns of performing an evaluation and management service by telephone and the availability of in person appointments. I also discussed with the patient that there may be a patient responsible charge related to this service. The patient expressed understanding and agreed to proceed.   History of Present Illness: [name] is a 58 year old African American female with uncontrolled DM who is contacted to discuss insulin therapy.   Her last HgbA1c was 14% on 03/10/2019.  She received her glucometer on Tuesday and has had three readings.  She had two fasting morning levels of ~ 120 and post-prandial levels of 250.  Her BP is still uncontrolled, but she admits to not taking her BP.    I have encouraged her to start insulin, but she is resistant to that idea.  She says she will do better on her diet and take her medications as prescribed.  She will also check BS in the am and two hours after lunch and dinner.   She will also check her BP twice daily.      She will take her Pepcid 20 mg BID to help with her stomach issues.      Observations/Objective: She did not sound distressed and answered questions appropriately.    Assessment and Plan: 1. Uncontrolled DM Strongly encouraged her to start insulin at this time and explained to her that she is at risk for DKA and it is life threatening - she begged [place]  to let her try for 30 days to get her sugars down - I will give her aother week with monitoring her sugars as she just received her glucometer.   2. HTN Uncontrolled.  Strongly encouraged patient to take her BP medications.   Follow Up Instructions:  We will have a visit with her next week to review BP's and BS's.      I discussed the assessment and treatment plan with the patient. The patient was provided an opportunity to ask questions and all were answered. The patient agreed with the plan and demonstrated an understanding of the instructions.   The patient was advised to call back or seek an in-person evaluation if the symptoms worsen or if the condition fails to improve as anticipated.  I provided 20 minutes of face-to-face time during this encounter.   Rayel Santizo, PA-C

## 2019-05-15 ENCOUNTER — Other Ambulatory Visit: Payer: Self-pay | Admitting: Adult Health Nurse Practitioner

## 2019-05-15 DIAGNOSIS — M25551 Pain in right hip: Secondary | ICD-10-CM

## 2019-06-17 ENCOUNTER — Other Ambulatory Visit: Payer: Self-pay | Admitting: Urology

## 2019-06-17 DIAGNOSIS — I10 Essential (primary) hypertension: Secondary | ICD-10-CM

## 2019-07-14 ENCOUNTER — Other Ambulatory Visit: Payer: Self-pay

## 2019-07-14 DIAGNOSIS — I10 Essential (primary) hypertension: Secondary | ICD-10-CM

## 2019-07-14 MED ORDER — METOPROLOL TARTRATE 50 MG PO TABS: 50.0000 mg | ORAL_TABLET | Freq: Two times a day (BID) | ORAL | 0 refills | Status: DC

## 2019-07-15 ENCOUNTER — Other Ambulatory Visit: Payer: Self-pay | Admitting: Adult Health Nurse Practitioner

## 2019-07-27 ENCOUNTER — Ambulatory Visit: Payer: Self-pay | Admitting: Gerontology

## 2019-07-27 ENCOUNTER — Telehealth: Payer: Self-pay | Admitting: Gerontology

## 2019-07-29 ENCOUNTER — Ambulatory Visit: Payer: Self-pay

## 2019-08-04 ENCOUNTER — Other Ambulatory Visit: Payer: Self-pay

## 2019-08-04 DIAGNOSIS — E785 Hyperlipidemia, unspecified: Secondary | ICD-10-CM

## 2019-08-04 DIAGNOSIS — E119 Type 2 diabetes mellitus without complications: Secondary | ICD-10-CM

## 2019-08-05 ENCOUNTER — Other Ambulatory Visit: Payer: Self-pay

## 2019-08-05 LAB — HEMOGLOBIN A1C
Est. average glucose Bld gHb Est-mCnc: 223 mg/dL
Hgb A1c MFr Bld: 9.4 % — ABNORMAL HIGH (ref 4.8–5.6)

## 2019-08-05 LAB — LIPID PANEL
Chol/HDL Ratio: 4.1 ratio (ref 0.0–4.4)
Cholesterol, Total: 158 mg/dL (ref 100–199)
HDL: 39 mg/dL — ABNORMAL LOW (ref >39)
LDL Chol Calc (NIH): 97 mg/dL (ref 0–99)
Triglycerides: 121 mg/dL (ref 0–149)
VLDL Cholesterol Cal: 22 mg/dL (ref 5–40)

## 2019-08-05 LAB — CBC WITH DIFFERENTIAL/PLATELET
Basophils Absolute: 0.1 10*3/uL (ref 0.0–0.2)
Basos: 0 %
EOS (ABSOLUTE): 0.3 10*3/uL (ref 0.0–0.4)
Eos: 2 %
Hematocrit: 45.4 % (ref 34.0–46.6)
Hemoglobin: 15.4 g/dL (ref 11.1–15.9)
Immature Grans (Abs): 0 10*3/uL (ref 0.0–0.1)
Immature Granulocytes: 0 %
Lymphocytes Absolute: 4.2 10*3/uL — ABNORMAL HIGH (ref 0.7–3.1)
Lymphs: 27 %
MCH: 30.5 pg (ref 26.6–33.0)
MCHC: 33.9 g/dL (ref 31.5–35.7)
MCV: 90 fL (ref 79–97)
Monocytes Absolute: 0.7 10*3/uL (ref 0.1–0.9)
Monocytes: 5 %
Neutrophils Absolute: 9.9 10*3/uL — ABNORMAL HIGH (ref 1.4–7.0)
Neutrophils: 66 %
Platelets: 292 10*3/uL (ref 150–450)
RBC: 5.05 x10E6/uL (ref 3.77–5.28)
RDW: 12.1 % (ref 11.7–15.4)
WBC: 15.2 10*3/uL — ABNORMAL HIGH (ref 3.4–10.8)

## 2019-08-05 LAB — BASIC METABOLIC PANEL
BUN/Creatinine Ratio: 17 (ref 9–23)
BUN: 10 mg/dL (ref 6–24)
CO2: 21 mmol/L (ref 20–29)
Calcium: 9.3 mg/dL (ref 8.7–10.2)
Chloride: 108 mmol/L — ABNORMAL HIGH (ref 96–106)
Creatinine, Ser: 0.59 mg/dL (ref 0.57–1.00)
GFR calc Af Amer: 118 mL/min/{1.73_m2} (ref >59)
GFR calc non Af Amer: 102 mL/min/{1.73_m2} (ref >59)
Glucose: 136 mg/dL — ABNORMAL HIGH (ref 65–99)
Potassium: 4.1 mmol/L (ref 3.5–5.2)
Sodium: 143 mmol/L (ref 134–144)

## 2019-08-12 ENCOUNTER — Other Ambulatory Visit: Payer: Self-pay

## 2019-08-12 ENCOUNTER — Ambulatory Visit: Payer: Self-pay | Admitting: Gerontology

## 2019-08-12 ENCOUNTER — Other Ambulatory Visit: Payer: Self-pay | Admitting: Urology

## 2019-08-12 VITALS — BP 156/81 | HR 80 | Temp 97.2°F | Ht 63.0 in | Wt 151.0 lb

## 2019-08-12 DIAGNOSIS — E119 Type 2 diabetes mellitus without complications: Secondary | ICD-10-CM

## 2019-08-12 DIAGNOSIS — E785 Hyperlipidemia, unspecified: Secondary | ICD-10-CM

## 2019-08-12 DIAGNOSIS — Z0000 Encounter for general adult medical examination without abnormal findings: Secondary | ICD-10-CM

## 2019-08-12 DIAGNOSIS — I10 Essential (primary) hypertension: Secondary | ICD-10-CM

## 2019-08-12 DIAGNOSIS — Z862 Personal history of diseases of the blood and blood-forming organs and certain disorders involving the immune mechanism: Secondary | ICD-10-CM

## 2019-08-12 MED ORDER — SITAGLIPTIN PHOSPHATE 50 MG PO TABS: 25.0000 mg | ORAL_TABLET | Freq: Every day | ORAL | 0 refills | Status: DC

## 2019-08-12 MED ORDER — METOPROLOL TARTRATE 50 MG PO TABS: 50.0000 mg | ORAL_TABLET | Freq: Two times a day (BID) | ORAL | 2 refills | Status: DC

## 2019-08-12 MED ORDER — GLIPIZIDE 10 MG PO TABS: ORAL_TABLET | ORAL | 1 refills | Status: DC

## 2019-08-12 NOTE — Patient Instructions (Signed)
DASH Eating Plan DASH stands for "Dietary Approaches to Stop Hypertension." The DASH eating plan is a healthy eating plan that has been shown to reduce high blood pressure (hypertension). It may also reduce your risk for type 2 diabetes, heart disease, and stroke. The DASH eating plan may also help with weight loss. What are tips for following this plan?  General guidelines  Avoid eating more than 2,300 mg (milligrams) of salt (sodium) a day. If you have hypertension, you may need to reduce your sodium intake to 1,500 mg a day.  Limit alcohol intake to no more than 1 drink a day for nonpregnant women and 2 drinks a day for men. One drink equals 12 oz of beer, 5 oz of wine, or 1 oz of hard liquor.  Work with your health care provider to maintain a healthy body weight or to lose weight. Ask what an ideal weight is for you.  Get at least 30 minutes of exercise that causes your heart to beat faster (aerobic exercise) most days of the week. Activities may include walking, swimming, or biking.  Work with your health care provider or diet and nutrition specialist (dietitian) to adjust your eating plan to your individual calorie needs. Reading food labels   Check food labels for the amount of sodium per serving. Choose foods with less than 5 percent of the Daily Value of sodium. Generally, foods with less than 300 mg of sodium per serving fit into this eating plan.  To find whole grains, look for the word "whole" as the first word in the ingredient list. Shopping  Buy products labeled as "low-sodium" or "no salt added."  Buy fresh foods. Avoid canned foods and premade or frozen meals. Cooking  Avoid adding salt when cooking. Use salt-free seasonings or herbs instead of table salt or sea salt. Check with your health care provider or pharmacist before using salt substitutes.  Do not fry foods. Cook foods using healthy methods such as baking, boiling, grilling, and broiling instead.  Cook with  heart-healthy oils, such as olive, canola, soybean, or sunflower oil. Meal planning  Eat a balanced diet that includes: ? 5 or more servings of fruits and vegetables each day. At each meal, try to fill half of your plate with fruits and vegetables. ? Up to 6-8 servings of whole grains each day. ? Less than 6 oz of lean meat, poultry, or fish each day. A 3-oz serving of meat is about the same size as a deck of cards. One egg equals 1 oz. ? 2 servings of low-fat dairy each day. ? A serving of nuts, seeds, or beans 5 times each week. ? Heart-healthy fats. Healthy fats called Omega-3 fatty acids are found in foods such as flaxseeds and coldwater fish, like sardines, salmon, and mackerel.  Limit how much you eat of the following: ? Canned or prepackaged foods. ? Food that is high in trans fat, such as fried foods. ? Food that is high in saturated fat, such as fatty meat. ? Sweets, desserts, sugary drinks, and other foods with added sugar. ? Full-fat dairy products.  Do not salt foods before eating.  Try to eat at least 2 vegetarian meals each week.  Eat more home-cooked food and less restaurant, buffet, and fast food.  When eating at a restaurant, ask that your food be prepared with less salt or no salt, if possible. What foods are recommended? The items listed may not be a complete list. Talk with your dietitian about   what dietary choices are best for you. Grains Whole-grain or whole-wheat bread. Whole-grain or whole-wheat pasta. Brown rice. Oatmeal. Quinoa. Bulgur. Whole-grain and low-sodium cereals. Pita bread. Low-fat, low-sodium crackers. Whole-wheat flour tortillas. Vegetables Fresh or frozen vegetables (raw, steamed, roasted, or grilled). Low-sodium or reduced-sodium tomato and vegetable juice. Low-sodium or reduced-sodium tomato sauce and tomato paste. Low-sodium or reduced-sodium canned vegetables. Fruits All fresh, dried, or frozen fruit. Canned fruit in natural juice (without  added sugar). Meat and other protein foods Skinless chicken or turkey. Ground chicken or turkey. Pork with fat trimmed off. Fish and seafood. Egg whites. Dried beans, peas, or lentils. Unsalted nuts, nut butters, and seeds. Unsalted canned beans. Lean cuts of beef with fat trimmed off. Low-sodium, lean deli meat. Dairy Low-fat (1%) or fat-free (skim) milk. Fat-free, low-fat, or reduced-fat cheeses. Nonfat, low-sodium ricotta or cottage cheese. Low-fat or nonfat yogurt. Low-fat, low-sodium cheese. Fats and oils Soft margarine without trans fats. Vegetable oil. Low-fat, reduced-fat, or light mayonnaise and salad dressings (reduced-sodium). Canola, safflower, olive, soybean, and sunflower oils. Avocado. Seasoning and other foods Herbs. Spices. Seasoning mixes without salt. Unsalted popcorn and pretzels. Fat-free sweets. What foods are not recommended? The items listed may not be a complete list. Talk with your dietitian about what dietary choices are best for you. Grains Baked goods made with fat, such as croissants, muffins, or some breads. Dry pasta or rice meal packs. Vegetables Creamed or fried vegetables. Vegetables in a cheese sauce. Regular canned vegetables (not low-sodium or reduced-sodium). Regular canned tomato sauce and paste (not low-sodium or reduced-sodium). Regular tomato and vegetable juice (not low-sodium or reduced-sodium). Pickles. Olives. Fruits Canned fruit in a light or heavy syrup. Fried fruit. Fruit in cream or butter sauce. Meat and other protein foods Fatty cuts of meat. Ribs. Fried meat. Bacon. Sausage. Bologna and other processed lunch meats. Salami. Fatback. Hotdogs. Bratwurst. Salted nuts and seeds. Canned beans with added salt. Canned or smoked fish. Whole eggs or egg yolks. Chicken or turkey with skin. Dairy Whole or 2% milk, cream, and half-and-half. Whole or full-fat cream cheese. Whole-fat or sweetened yogurt. Full-fat cheese. Nondairy creamers. Whipped toppings.  Processed cheese and cheese spreads. Fats and oils Butter. Stick margarine. Lard. Shortening. Ghee. Bacon fat. Tropical oils, such as coconut, palm kernel, or palm oil. Seasoning and other foods Salted popcorn and pretzels. Onion salt, garlic salt, seasoned salt, table salt, and sea salt. Worcestershire sauce. Tartar sauce. Barbecue sauce. Teriyaki sauce. Soy sauce, including reduced-sodium. Steak sauce. Canned and packaged gravies. Fish sauce. Oyster sauce. Cocktail sauce. Horseradish that you find on the shelf. Ketchup. Mustard. Meat flavorings and tenderizers. Bouillon cubes. Hot sauce and Tabasco sauce. Premade or packaged marinades. Premade or packaged taco seasonings. Relishes. Regular salad dressings. Where to find more information:  National Heart, Lung, and Blood Institute: www.nhlbi.nih.gov  American Heart Association: www.heart.org Summary  The DASH eating plan is a healthy eating plan that has been shown to reduce high blood pressure (hypertension). It may also reduce your risk for type 2 diabetes, heart disease, and stroke.  With the DASH eating plan, you should limit salt (sodium) intake to 2,300 mg a day. If you have hypertension, you may need to reduce your sodium intake to 1,500 mg a day.  When on the DASH eating plan, aim to eat more fresh fruits and vegetables, whole grains, lean proteins, low-fat dairy, and heart-healthy fats.  Work with your health care provider or diet and nutrition specialist (dietitian) to adjust your eating plan to your   individual calorie needs. This information is not intended to replace advice given to you by your health care provider. Make sure you discuss any questions you have with your health care provider. Document Released: 10/03/2011 Document Revised: 09/26/2017 Document Reviewed: 10/07/2016 Elsevier Patient Education  2020 [employer]. Carbohydrate Counting for Diabetes Mellitus, Adult  Carbohydrate counting is a method of keeping track of  how many carbohydrates you eat. Eating carbohydrates naturally increases the amount of sugar (glucose) in the blood. Counting how many carbohydrates you eat helps keep your blood glucose within normal limits, which helps you manage your diabetes (diabetes mellitus). It is important to know how many carbohydrates you can safely have in each meal. This is different for every person. A diet and nutrition specialist (registered dietitian) can help you make a meal plan and calculate how many carbohydrates you should have at each meal and snack. Carbohydrates are found in the following foods:  Grains, such as breads and cereals.  Dried beans and soy products.  Starchy vegetables, such as potatoes, peas, and corn.  Fruit and fruit juices.  Milk and yogurt.  Sweets and snack foods, such as cake, cookies, candy, chips, and soft drinks. How do I count carbohydrates? There are two ways to count carbohydrates in food. You can use either of the methods or a combination of both. Reading "Nutrition Facts" on packaged food The "Nutrition Facts" list is included on the labels of almost all packaged foods and beverages in the [place] It includes:  The serving size.  Information about nutrients in each serving, including the grams (g) of carbohydrate per serving. To use the "Nutrition Facts":  Decide how many servings you will have.  Multiply the number of servings by the number of carbohydrates per serving.  The resulting number is the total amount of carbohydrates that you will be having. Learning standard serving sizes of other foods When you eat carbohydrate foods that are not packaged or do not include "Nutrition Facts" on the label, you need to measure the servings in order to count the amount of carbohydrates:  Measure the foods that you will eat with a food scale or measuring cup, if needed.  Decide how many standard-size servings you will eat.  Multiply the number of servings by 15. Most  carbohydrate-rich foods have about 15 g of carbohydrates per serving. ? For example, if you eat 8 oz (170 g) of strawberries, you will have eaten 2 servings and 30 g of carbohydrates (2 servings x 15 g = 30 g).  For foods that have more than one food mixed, such as soups and casseroles, you must count the carbohydrates in each food that is included. The following list contains standard serving sizes of common carbohydrate-rich foods. Each of these servings has about 15 g of carbohydrates:   hamburger bun or  English muffin.   oz (15 mL) syrup.   oz (14 g) jelly.  1 slice of bread.  1 six-inch tortilla.  3 oz (85 g) cooked rice or pasta.  4 oz (113 g) cooked dried beans.  4 oz (113 g) starchy vegetable, such as peas, corn, or potatoes.  4 oz (113 g) hot cereal.  4 oz (113 g) mashed potatoes or  of a large baked potato.  4 oz (113 g) canned or frozen fruit.  4 oz (120 mL) fruit juice.  4-6 crackers.  6 chicken nuggets.  6 oz (170 g) unsweetened dry cereal.  6 oz (170 g) plain fat-free yogurt or   yogurt sweetened with artificial sweeteners.  8 oz (240 mL) milk.  8 oz (170 g) fresh fruit or one small piece of fruit.  24 oz (680 g) popped popcorn. Example of carbohydrate counting Sample meal  3 oz (85 g) chicken breast.  6 oz (170 g) brown rice.  4 oz (113 g) corn.  8 oz (240 mL) milk.  8 oz (170 g) strawberries with sugar-free whipped topping. Carbohydrate calculation 1. Identify the foods that contain carbohydrates: ? Rice. ? Corn. ? Milk. ? Strawberries. 2. Calculate how many servings you have of each food: ? 2 servings rice. ? 1 serving corn. ? 1 serving milk. ? 1 serving strawberries. 3. Multiply each number of servings by 15 g: ? 2 servings rice x 15 g = 30 g. ? 1 serving corn x 15 g = 15 g. ? 1 serving milk x 15 g = 15 g. ? 1 serving strawberries x 15 g = 15 g. 4. Add together all of the amounts to find the total grams of carbohydrates  eaten: ? 30 g + 15 g + 15 g + 15 g = 75 g of carbohydrates total. Summary  Carbohydrate counting is a method of keeping track of how many carbohydrates you eat.  Eating carbohydrates naturally increases the amount of sugar (glucose) in the blood.  Counting how many carbohydrates you eat helps keep your blood glucose within normal limits, which helps you manage your diabetes.  A diet and nutrition specialist (registered dietitian) can help you make a meal plan and calculate how many carbohydrates you should have at each meal and snack. This information is not intended to replace advice given to you by your health care provider. Make sure you discuss any questions you have with your health care provider. Document Released: 10/14/2005 Document Revised: 05/08/2017 Document Reviewed: 03/27/2016 Elsevier Patient Education  2020 Elsevier Inc.  

## 2019-08-12 NOTE — Progress Notes (Signed)
Established Patient Office Visit  Subjective:  Patient ID: Laurie LipaBeverly Whitner, female    DOB: [birth year]  Age: 58 y.o. MRN: [id number]  CC:  Chief Complaint  Patient presents with  . Diabetes  . Hypertension    HPI Laurie Kirby presents for follow up of Type 2 diabetes mellitus, hypertension ,hyperlipidemia and medication refill. Her HgbA1c done on 08/04/2019 was 9.4%, she reports being compliant with her medications, adheres to healthy diet and exercises as tolerated. She reports that she checks her blood glucose once every other day and her fasting reading was 189 mg/dl. She denies hypoglycemic symptoms and peripheral neuropathy.She performs foot checks and doesn't monitor her blood pressure at home. Her blood pressure during visit was 156/81, and she states that she has not taking her medications prior to visit.   Her WBC was 15.2 and she reports that she has a history of Leukocytosis, denies any fever, chills and other signs of infection. She states that she has not had Mammogram nor Pap smear, and Colonoscopy done. She denies chest pain, palpitation, light headedness, myalgia, hematuria and hematochezia. She reports that she's doing well and offers no further complaint.       Past Medical History:  Diagnosis Date  . Diabetes mellitus without complication (HCC)   . Hyperlipidemia   . Hypertension     Past Surgical History:  Procedure Laterality Date  . CORONARY ANGIOPLASTY WITH STENT PLACEMENT      Family History  Problem Relation Age of Onset  . Diabetes type II Mother   . Diabetes type II Father   . Heart disease Father   . Cancer Maternal Aunt   . Breast cancer Cousin     Social History   Socioeconomic History  . Marital status: Single    Spouse name: Not on file  . Number of children: 3  . Years of education: Not on file  . Highest education level: High school graduate  Occupational History  . Occupation: unemployed  Social Needs  . Financial resource strain:  Not very hard  . Food insecurity    Worry: Never true    Inability: Never true  . Transportation needs    Medical: No    Non-medical: No  Tobacco Use  . Smoking status: Former Smoker    Quit date: 12/07/2013    Years since quitting: 5.6  . Smokeless tobacco: Never Used  Substance and Sexual Activity  . Alcohol use: Yes    Alcohol/week: 4.0 standard drinks    Types: 4 Glasses of wine per week    Comment:  wine on the weekends  . Drug use: No  . Sexual activity: Yes  Lifestyle  . Physical activity    Days per week: 0 days    Minutes per session: Not on file  . Stress: Not at all  Relationships  . Social connections    Talks on phone: More than three times a week    Gets together: Once a week    Attends religious service: More than 4 times per year    Active member of club or organization: Yes    Attends meetings of clubs or organizations: More than 4 times per year    Relationship status: Living with partner  . Intimate partner violence    Fear of current or ex partner: No    Emotionally abused: No    Physically abused: No    Forced sexual activity: No  Other Topics Concern  . Not on file  Social History Narrative   A friend supports. Not on food stamps. Pretty well taken care of by friend.     Outpatient Medications Prior to Visit  Medication Sig Dispense Refill  . aspirin 81 MG tablet Take 1 tablet (81 mg total) by mouth daily. 30 tablet 6  . atorvastatin (LIPITOR) 40 MG tablet TAKE 1 TABLET (40MG  TOTAL) BY MOUTH DAILY 90 tablet 1  . clopidogrel (PLAVIX) 75 MG tablet Take 1 tablet (75 mg total) by mouth daily. 90 tablet 1  . famotidine (PEPCID) 20 MG tablet Take 1 tablet (20 mg total) by mouth 2 (two) times daily. 30 tablet 6  . ibuprofen (ADVIL) 800 MG tablet TAKE 1 TABLET BY MOUTH EVERY 8 HOURS AS NEEDED. 60 tablet 1  . isosorbide mononitrate (IMDUR) 30 MG 24 hr tablet Take 1 tablet (30 mg total) by mouth daily. 90 tablet 1  . lisinopril (ZESTRIL) 40 MG tablet Take  1 tablet (40 mg total) by mouth daily. 30 tablet 3  . metFORMIN (GLUCOPHAGE) 1000 MG tablet Take 1 tablet (1,000 mg total) by mouth 2 (two) times daily with a meal. 180 tablet 1  . mometasone (ELOCON) 0.1 % ointment Apply topically daily. 45 g 3  . glipiZIDE (GLUCOTROL) 10 MG tablet TAKE 2 TABLETS BY MOUTH EVERY DAY BEFOREBREAKFAST 180 tablet 1  . metoprolol tartrate (LOPRESSOR) 50 MG tablet Take 1 tablet (50 mg total) by mouth 2 (two) times daily. 60 tablet 0  . ASPIRIN ADULT LOW STRENGTH 81 MG EC tablet TAKE 1 TABLET BY MOUTH DAILY 30 tablet 2   No facility-administered medications prior to visit.     No Known Allergies  ROS Review of Systems  Constitutional: Negative.   HENT: Negative.   Respiratory: Negative.   Cardiovascular: Negative.   Gastrointestinal: Negative.   Endocrine: Negative.   Genitourinary: Negative.   Skin: Negative.   Neurological: Negative.   Hematological: Negative.   Psychiatric/Behavioral: Negative.       Objective:    Physical Exam  Constitutional: She is oriented to person, place, and time. She appears well-developed and well-nourished.  HENT:  Head: Normocephalic and atraumatic.  Eyes: Pupils are equal, round, and reactive to light. EOM are normal.  Cardiovascular: Normal rate and regular rhythm.  Pulmonary/Chest: Effort normal and breath sounds normal.  Abdominal: Soft. Bowel sounds are normal.  Musculoskeletal: Normal range of motion.  Neurological: She is alert and oriented to person, place, and time.  Skin: Skin is warm and dry.  Psychiatric: She has a normal mood and affect. Her behavior is normal. Judgment and thought content normal.    BP (!) 156/81 (BP Location: Right Arm, Patient Position: Sitting)   Pulse 80   Temp (!) 97.2 F (36.2 C)   Ht 5\' 3"  (1.6 m)   Wt 151 lb (68.5 kg)   BMI 26.75 kg/m  Wt Readings from Last 3 Encounters:  08/12/19 151 lb (68.5 kg)  03/11/19 162 lb (73.5 kg)  12/29/18 157 lb 12.8 oz (71.6 kg)      Health Maintenance Due  Topic Date Due  . Hepatitis C Screening  [birth year]  . PNEUMOCOCCAL POLYSACCHARIDE VACCINE AGE 67-64 HIGH RISK  06/10/1964  . HIV Screening  06/10/1977  . TETANUS/TDAP  06/10/1981  . COLONOSCOPY  06/10/2012  . OPHTHALMOLOGY EXAM  05/23/2017  . PAP SMEAR-Modifier  10/10/2018    There are no preventive care reminders to display for this patient.  Lab Results  Component Value Date   TSH 1.590 05/08/2017  Lab Results  Component Value Date   WBC 15.2 (H) 08/04/2019   HGB 15.4 08/04/2019   HCT 45.4 08/04/2019   MCV 90 08/04/2019   PLT 292 08/04/2019   Lab Results  Component Value Date   NA 143 08/04/2019   K 4.1 08/04/2019   CO2 21 08/04/2019   GLUCOSE 136 (H) 08/04/2019   BUN 10 08/04/2019   CREATININE 0.59 08/04/2019   BILITOT <0.2 12/01/2018   ALKPHOS 77 12/01/2018   AST 15 12/01/2018   ALT 13 12/01/2018   PROT 7.7 12/01/2018   ALBUMIN 4.2 12/01/2018   CALCIUM 9.3 08/04/2019   ANIONGAP 7 10/02/2017   Lab Results  Component Value Date   CHOL 158 08/04/2019   Lab Results  Component Value Date   HDL 39 (L) 08/04/2019   Lab Results  Component Value Date   LDLCALC 97 08/04/2019   Lab Results  Component Value Date   TRIG 121 08/04/2019   Lab Results  Component Value Date   CHOLHDL 4.1 08/04/2019   Lab Results  Component Value Date   HGBA1C 9.4 (H) 08/04/2019      Assessment & Plan:     1. Type 2 diabetes mellitus without complication, without long-term current use of insulin (HCC) - She will start Januvia 25 mg daily, was educated on side effects and to notify clinic. -Use Diabetic diet as advised  -Check blood sugar 2 times a day, once before breakfast and others 2 hours after lunch or dinner,write down the numbers against date in a log and bring log to clinic every visit -Take medications regularly as advised -Regular exercise as tolerated - glipiZIDE (GLUCOTROL) 10 MG tablet; TAKE 1 TABLET BY MOUTH EVERY DAY  BEFOREBREAKFAST  Dispense: 180 tablet; Refill: 1 - sitaGLIPtin (JANUVIA) 50 MG tablet; Take 0.5 tablets (25 mg total) by mouth daily.  Dispense: 30 tablet; Refill: 0 - HgB A1c; Standing  2. Hyperlipidemia, unspecified hyperlipidemia type - She will continue on her current medication --Low fat Diet, like low fat dairy products eg skimmed milk -Avoid any fried food -Regular exercise/walk -Goal for Total Cholesterol is less than 200 -Goal for bad cholesterol LDL is less than 70 -Goal for Good cholesterol HDL is more than 45 -Goal for Triglyceride is less than 150   3. Essential hypertension - She will continue on current treatmen regimen -Low salt DASH diet -Take medications regularly on time -Exercise regularly as tolerated -Check blood pressure at least once a week at home or a nearby pharmacy and record -Goal is less than 140/90 and normal blood pressure is 120/80 - metoprolol tartrate (LOPRESSOR) 50 MG tablet; Take 1 tablet (50 mg total) by mouth 2 (two) times daily.  Dispense: 60 tablet; Refill: 2  4. Health care maintenance - She was encouraged to complete charity care application for- Ambulatory referral to Gastroenterology for Colonoscopy - Ambulatory referral to Hematology / Oncology for Mammogram and Pap smear. -  5. History of leukocytosis - Her WBC was 15.2, she was advised to notify clinic with any sign of infection.   Follow-up: Return in about 1 month (around 09/13/2019), or if symptoms worsen or fail to improve.    Mat Stuard Trellis Paganini, NP

## 2019-08-14 DIAGNOSIS — Z862 Personal history of diseases of the blood and blood-forming organs and certain disorders involving the immune mechanism: Secondary | ICD-10-CM | POA: Insufficient documentation

## 2019-08-16 ENCOUNTER — Other Ambulatory Visit: Payer: Self-pay | Admitting: Urology

## 2019-08-17 ENCOUNTER — Other Ambulatory Visit: Payer: Self-pay

## 2019-08-17 ENCOUNTER — Telehealth: Payer: Self-pay

## 2019-08-17 DIAGNOSIS — Z1211 Encounter for screening for malignant neoplasm of colon: Secondary | ICD-10-CM

## 2019-08-17 NOTE — Telephone Encounter (Signed)
Gastroenterology Pre-Procedure Review  Request Date: Monday 09/20/19 Requesting Physician: Dr. Vicente Males  PATIENT REVIEW QUESTIONS: The patient responded to the following health history questions as indicated:    1. Are you having any GI issues? no 2. Do you have a personal history of Polyps? no 3. Do you have a family history of Colon Cancer or Polyps? no 4. Diabetes Mellitus? yes (oral meds) 5. Joint replacements in the past 12 months?no 6. Major health problems in the past 3 months?no 7. Any artificial heart valves, MVP, or defibrillator?Patient has stent.    MEDICATIONS & ALLERGIES:    Patient reports the following regarding taking any anticoagulation/antiplatelet therapy:   Plavix, Coumadin, Eliquis, Xarelto, Lovenox, Pradaxa, Brilinta, or Effient? yes (Plavix 75mg ) Blood thinner request sent to PCP-pt does not have cardiologist Aspirin? yes (81 mg aspirin)  Patient confirms/reports the following medications:  Current Outpatient Medications  Medication Sig Dispense Refill  . aspirin 81 MG tablet Take 1 tablet (81 mg total) by mouth daily. 30 tablet 6  . atorvastatin (LIPITOR) 40 MG tablet TAKE 1 TABLET (40MG  TOTAL) BY MOUTH DAILY 90 tablet 1  . clopidogrel (PLAVIX) 75 MG tablet Take 1 tablet (75 mg total) by mouth daily. 90 tablet 1  . famotidine (PEPCID) 20 MG tablet Take 1 tablet (20 mg total) by mouth 2 (two) times daily. 30 tablet 6  . glipiZIDE (GLUCOTROL) 10 MG tablet TAKE 1 TABLET BY MOUTH EVERY DAY BEFOREBREAKFAST 180 tablet 1  . ibuprofen (ADVIL) 800 MG tablet TAKE 1 TABLET BY MOUTH EVERY 8 HOURS AS NEEDED. 60 tablet 1  . isosorbide mononitrate (IMDUR) 30 MG 24 hr tablet Take 1 tablet (30 mg total) by mouth daily. 90 tablet 1  . lisinopril (ZESTRIL) 40 MG tablet TAKE 1 TABLET BY MOUTH DAILY 30 tablet 3  . metFORMIN (GLUCOPHAGE) 1000 MG tablet Take 1 tablet (1,000 mg total) by mouth 2 (two) times daily with a meal. 180 tablet 1  . metoprolol tartrate (LOPRESSOR) 50 MG tablet  Take 1 tablet (50 mg total) by mouth 2 (two) times daily. 60 tablet 2  . mometasone (ELOCON) 0.1 % ointment Apply topically daily. 45 g 3  . sitaGLIPtin (JANUVIA) 50 MG tablet Take 0.5 tablets (25 mg total) by mouth daily. 30 tablet 0   No current facility-administered medications for this visit.     Patient confirms/reports the following allergies:  No Known Allergies  No orders of the defined types were placed in this encounter.   AUTHORIZATION INFORMATION Primary Insurance: 1D#: Group #:  Secondary Insurance: 1D#: Group #:  SCHEDULE INFORMATION: Date: 09/20/19 Time: Location:[facility]

## 2019-09-13 ENCOUNTER — Ambulatory Visit: Payer: Self-pay | Admitting: Gerontology

## 2019-09-13 DIAGNOSIS — I10 Essential (primary) hypertension: Secondary | ICD-10-CM

## 2019-09-13 DIAGNOSIS — E119 Type 2 diabetes mellitus without complications: Secondary | ICD-10-CM

## 2019-09-13 MED ORDER — SITAGLIPTIN PHOSPHATE 50 MG PO TABS: 25.0000 mg | ORAL_TABLET | Freq: Every day | ORAL | 0 refills | Status: DC

## 2019-09-13 NOTE — Patient Instructions (Signed)
DASH Eating Plan DASH stands for "Dietary Approaches to Stop Hypertension." The DASH eating plan is a healthy eating plan that has been shown to reduce high blood pressure (hypertension). It may also reduce your risk for type 2 diabetes, heart disease, and stroke. The DASH eating plan may also help with weight loss. What are tips for following this plan?  General guidelines  Avoid eating more than 2,300 mg (milligrams) of salt (sodium) a day. If you have hypertension, you may need to reduce your sodium intake to 1,500 mg a day.  Limit alcohol intake to no more than 1 drink a day for nonpregnant women and 2 drinks a day for men. One drink equals 12 oz of beer, 5 oz of wine, or 1 oz of hard liquor.  Work with your health care provider to maintain a healthy body weight or to lose weight. Ask what an ideal weight is for you.  Get at least 30 minutes of exercise that causes your heart to beat faster (aerobic exercise) most days of the week. Activities may include walking, swimming, or biking.  Work with your health care provider or diet and nutrition specialist (dietitian) to adjust your eating plan to your individual calorie needs. Reading food labels   Check food labels for the amount of sodium per serving. Choose foods with less than 5 percent of the Daily Value of sodium. Generally, foods with less than 300 mg of sodium per serving fit into this eating plan.  To find whole grains, look for the word "whole" as the first word in the ingredient list. Shopping  Buy products labeled as "low-sodium" or "no salt added."  Buy fresh foods. Avoid canned foods and premade or frozen meals. Cooking  Avoid adding salt when cooking. Use salt-free seasonings or herbs instead of table salt or sea salt. Check with your health care provider or pharmacist before using salt substitutes.  Do not fry foods. Cook foods using healthy methods such as baking, boiling, grilling, and broiling instead.  Cook with  heart-healthy oils, such as olive, canola, soybean, or sunflower oil. Meal planning  Eat a balanced diet that includes: ? 5 or more servings of fruits and vegetables each day. At each meal, try to fill half of your plate with fruits and vegetables. ? Up to 6-8 servings of whole grains each day. ? Less than 6 oz of lean meat, poultry, or fish each day. A 3-oz serving of meat is about the same size as a deck of cards. One egg equals 1 oz. ? 2 servings of low-fat dairy each day. ? A serving of nuts, seeds, or beans 5 times each week. ? Heart-healthy fats. Healthy fats called Omega-3 fatty acids are found in foods such as flaxseeds and coldwater fish, like sardines, salmon, and mackerel.  Limit how much you eat of the following: ? Canned or prepackaged foods. ? Food that is high in trans fat, such as fried foods. ? Food that is high in saturated fat, such as fatty meat. ? Sweets, desserts, sugary drinks, and other foods with added sugar. ? Full-fat dairy products.  Do not salt foods before eating.  Try to eat at least 2 vegetarian meals each week.  Eat more home-cooked food and less restaurant, buffet, and fast food.  When eating at a restaurant, ask that your food be prepared with less salt or no salt, if possible. What foods are recommended? The items listed may not be a complete list. Talk with your dietitian about   what dietary choices are best for you. Grains Whole-grain or whole-wheat bread. Whole-grain or whole-wheat pasta. Brown rice. Oatmeal. Quinoa. Bulgur. Whole-grain and low-sodium cereals. Pita bread. Low-fat, low-sodium crackers. Whole-wheat flour tortillas. Vegetables Fresh or frozen vegetables (raw, steamed, roasted, or grilled). Low-sodium or reduced-sodium tomato and vegetable juice. Low-sodium or reduced-sodium tomato sauce and tomato paste. Low-sodium or reduced-sodium canned vegetables. Fruits All fresh, dried, or frozen fruit. Canned fruit in natural juice (without  added sugar). Meat and other protein foods Skinless chicken or turkey. Ground chicken or turkey. Pork with fat trimmed off. Fish and seafood. Egg whites. Dried beans, peas, or lentils. Unsalted nuts, nut butters, and seeds. Unsalted canned beans. Lean cuts of beef with fat trimmed off. Low-sodium, lean deli meat. Dairy Low-fat (1%) or fat-free (skim) milk. Fat-free, low-fat, or reduced-fat cheeses. Nonfat, low-sodium ricotta or cottage cheese. Low-fat or nonfat yogurt. Low-fat, low-sodium cheese. Fats and oils Soft margarine without trans fats. Vegetable oil. Low-fat, reduced-fat, or light mayonnaise and salad dressings (reduced-sodium). Canola, safflower, olive, soybean, and sunflower oils. Avocado. Seasoning and other foods Herbs. Spices. Seasoning mixes without salt. Unsalted popcorn and pretzels. Fat-free sweets. What foods are not recommended? The items listed may not be a complete list. Talk with your dietitian about what dietary choices are best for you. Grains Baked goods made with fat, such as croissants, muffins, or some breads. Dry pasta or rice meal packs. Vegetables Creamed or fried vegetables. Vegetables in a cheese sauce. Regular canned vegetables (not low-sodium or reduced-sodium). Regular canned tomato sauce and paste (not low-sodium or reduced-sodium). Regular tomato and vegetable juice (not low-sodium or reduced-sodium). Pickles. Olives. Fruits Canned fruit in a light or heavy syrup. Fried fruit. Fruit in cream or butter sauce. Meat and other protein foods Fatty cuts of meat. Ribs. Fried meat. Bacon. Sausage. Bologna and other processed lunch meats. Salami. Fatback. Hotdogs. Bratwurst. Salted nuts and seeds. Canned beans with added salt. Canned or smoked fish. Whole eggs or egg yolks. Chicken or turkey with skin. Dairy Whole or 2% milk, cream, and half-and-half. Whole or full-fat cream cheese. Whole-fat or sweetened yogurt. Full-fat cheese. Nondairy creamers. Whipped toppings.  Processed cheese and cheese spreads. Fats and oils Butter. Stick margarine. Lard. Shortening. Ghee. Bacon fat. Tropical oils, such as coconut, palm kernel, or palm oil. Seasoning and other foods Salted popcorn and pretzels. Onion salt, garlic salt, seasoned salt, table salt, and sea salt. Worcestershire sauce. Tartar sauce. Barbecue sauce. Teriyaki sauce. Soy sauce, including reduced-sodium. Steak sauce. Canned and packaged gravies. Fish sauce. Oyster sauce. Cocktail sauce. Horseradish that you find on the shelf. Ketchup. Mustard. Meat flavorings and tenderizers. Bouillon cubes. Hot sauce and Tabasco sauce. Premade or packaged marinades. Premade or packaged taco seasonings. Relishes. Regular salad dressings. Where to find more information:  National Heart, Lung, and Blood Institute: www.nhlbi.nih.gov  American Heart Association: www.heart.org Summary  The DASH eating plan is a healthy eating plan that has been shown to reduce high blood pressure (hypertension). It may also reduce your risk for type 2 diabetes, heart disease, and stroke.  With the DASH eating plan, you should limit salt (sodium) intake to 2,300 mg a day. If you have hypertension, you may need to reduce your sodium intake to 1,500 mg a day.  When on the DASH eating plan, aim to eat more fresh fruits and vegetables, whole grains, lean proteins, low-fat dairy, and heart-healthy fats.  Work with your health care provider or diet and nutrition specialist (dietitian) to adjust your eating plan to your   individual calorie needs. This information is not intended to replace advice given to you by your health care provider. Make sure you discuss any questions you have with your health care provider. Document Released: 10/03/2011 Document Revised: 09/26/2017 Document Reviewed: 10/07/2016 Elsevier Patient Education  2020 [employer]. Carbohydrate Counting for Diabetes Mellitus, Adult  Carbohydrate counting is a method of keeping track of  how many carbohydrates you eat. Eating carbohydrates naturally increases the amount of sugar (glucose) in the blood. Counting how many carbohydrates you eat helps keep your blood glucose within normal limits, which helps you manage your diabetes (diabetes mellitus). It is important to know how many carbohydrates you can safely have in each meal. This is different for every person. A diet and nutrition specialist (registered dietitian) can help you make a meal plan and calculate how many carbohydrates you should have at each meal and snack. Carbohydrates are found in the following foods:  Grains, such as breads and cereals.  Dried beans and soy products.  Starchy vegetables, such as potatoes, peas, and corn.  Fruit and fruit juices.  Milk and yogurt.  Sweets and snack foods, such as cake, cookies, candy, chips, and soft drinks. How do I count carbohydrates? There are two ways to count carbohydrates in food. You can use either of the methods or a combination of both. Reading "Nutrition Facts" on packaged food The "Nutrition Facts" list is included on the labels of almost all packaged foods and beverages in the [place] It includes:  The serving size.  Information about nutrients in each serving, including the grams (g) of carbohydrate per serving. To use the "Nutrition Facts":  Decide how many servings you will have.  Multiply the number of servings by the number of carbohydrates per serving.  The resulting number is the total amount of carbohydrates that you will be having. Learning standard serving sizes of other foods When you eat carbohydrate foods that are not packaged or do not include "Nutrition Facts" on the label, you need to measure the servings in order to count the amount of carbohydrates:  Measure the foods that you will eat with a food scale or measuring cup, if needed.  Decide how many standard-size servings you will eat.  Multiply the number of servings by 15. Most  carbohydrate-rich foods have about 15 g of carbohydrates per serving. ? For example, if you eat 8 oz (170 g) of strawberries, you will have eaten 2 servings and 30 g of carbohydrates (2 servings x 15 g = 30 g).  For foods that have more than one food mixed, such as soups and casseroles, you must count the carbohydrates in each food that is included. The following list contains standard serving sizes of common carbohydrate-rich foods. Each of these servings has about 15 g of carbohydrates:   hamburger bun or  English muffin.   oz (15 mL) syrup.   oz (14 g) jelly.  1 slice of bread.  1 six-inch tortilla.  3 oz (85 g) cooked rice or pasta.  4 oz (113 g) cooked dried beans.  4 oz (113 g) starchy vegetable, such as peas, corn, or potatoes.  4 oz (113 g) hot cereal.  4 oz (113 g) mashed potatoes or  of a large baked potato.  4 oz (113 g) canned or frozen fruit.  4 oz (120 mL) fruit juice.  4-6 crackers.  6 chicken nuggets.  6 oz (170 g) unsweetened dry cereal.  6 oz (170 g) plain fat-free yogurt or   yogurt sweetened with artificial sweeteners.  8 oz (240 mL) milk.  8 oz (170 g) fresh fruit or one small piece of fruit.  24 oz (680 g) popped popcorn. Example of carbohydrate counting Sample meal  3 oz (85 g) chicken breast.  6 oz (170 g) brown rice.  4 oz (113 g) corn.  8 oz (240 mL) milk.  8 oz (170 g) strawberries with sugar-free whipped topping. Carbohydrate calculation 1. Identify the foods that contain carbohydrates: ? Rice. ? Corn. ? Milk. ? Strawberries. 2. Calculate how many servings you have of each food: ? 2 servings rice. ? 1 serving corn. ? 1 serving milk. ? 1 serving strawberries. 3. Multiply each number of servings by 15 g: ? 2 servings rice x 15 g = 30 g. ? 1 serving corn x 15 g = 15 g. ? 1 serving milk x 15 g = 15 g. ? 1 serving strawberries x 15 g = 15 g. 4. Add together all of the amounts to find the total grams of carbohydrates  eaten: ? 30 g + 15 g + 15 g + 15 g = 75 g of carbohydrates total. Summary  Carbohydrate counting is a method of keeping track of how many carbohydrates you eat.  Eating carbohydrates naturally increases the amount of sugar (glucose) in the blood.  Counting how many carbohydrates you eat helps keep your blood glucose within normal limits, which helps you manage your diabetes.  A diet and nutrition specialist (registered dietitian) can help you make a meal plan and calculate how many carbohydrates you should have at each meal and snack. This information is not intended to replace advice given to you by your health care provider. Make sure you discuss any questions you have with your health care provider. Document Released: 10/14/2005 Document Revised: 05/08/2017 Document Reviewed: 03/27/2016 Elsevier Patient Education  2020 Elsevier Inc.  
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
## 2019-09-14 ENCOUNTER — Telehealth: Payer: Self-pay | Admitting: Gerontology

## 2019-09-16 ENCOUNTER — Other Ambulatory Visit
Admission: RE | Admit: 2019-09-16 | Discharge: 2019-09-16 | Disposition: A | Discharge status: Home / Self Care | Payer: Self-pay | Source: Ambulatory Visit | Attending: Gastroenterology | Admitting: Gastroenterology

## 2019-09-16 DIAGNOSIS — Z01812 Encounter for preprocedural laboratory examination: Secondary | ICD-10-CM | POA: Insufficient documentation

## 2019-09-16 DIAGNOSIS — Z20828 Contact with and (suspected) exposure to other viral communicable diseases: Secondary | ICD-10-CM | POA: Insufficient documentation

## 2019-09-16 LAB — SARS CORONAVIRUS 2 (TAT 6-24 HRS): SARS Coronavirus 2: NEGATIVE

## 2019-09-20 ENCOUNTER — Ambulatory Visit
Admission: RE | Admit: 2019-09-20 | Discharge: 2019-09-20 | Disposition: A | Discharge status: Home / Self Care | Payer: Self-pay | Attending: Gastroenterology | Admitting: Gastroenterology

## 2019-09-20 ENCOUNTER — Other Ambulatory Visit: Payer: Self-pay

## 2019-09-20 ENCOUNTER — Telehealth: Payer: Self-pay

## 2019-09-20 ENCOUNTER — Ambulatory Visit: Payer: Self-pay | Admitting: Certified Registered Nurse Anesthetist

## 2019-09-20 ENCOUNTER — Encounter
Admission: RE | Disposition: A | Discharge status: Home / Self Care | Payer: Self-pay | Source: Home / Self Care | Attending: Gastroenterology

## 2019-09-20 ENCOUNTER — Encounter: Payer: Self-pay | Admitting: *Deleted

## 2019-09-20 DIAGNOSIS — Z1211 Encounter for screening for malignant neoplasm of colon: Secondary | ICD-10-CM

## 2019-09-20 DIAGNOSIS — Z538 Procedure and treatment not carried out for other reasons: Secondary | ICD-10-CM | POA: Insufficient documentation

## 2019-09-20 LAB — GLUCOSE, CAPILLARY: Glucose-Capillary: 159 mg/dL — ABNORMAL HIGH (ref 70–99)

## 2019-09-20 SURGERY — COLONOSCOPY WITH PROPOFOL
Anesthesia: General

## 2019-09-20 MED ORDER — SODIUM CHLORIDE 0.9 % IV SOLN
INTRAVENOUS | Status: DC
  Administered 2019-09-20: 08:00:00 via INTRAVENOUS

## 2019-09-20 MED ORDER — PROPOFOL 10 MG/ML IV BOLUS
INTRAVENOUS | Status: AC
  Filled 2019-09-20: qty 100

## 2019-09-20 MED ORDER — PHENYLEPHRINE HCL (PRESSORS) 10 MG/ML IV SOLN
INTRAVENOUS | Status: AC
  Filled 2019-09-20: qty 1

## 2019-09-20 MED ORDER — LIDOCAINE HCL (PF) 2 % IJ SOLN
INTRAMUSCULAR | Status: AC
  Filled 2019-09-20: qty 10

## 2019-09-20 MED ORDER — SUCCINYLCHOLINE CHLORIDE 20 MG/ML IJ SOLN
INTRAMUSCULAR | Status: AC
  Filled 2019-09-20: qty 1

## 2019-09-20 MED ORDER — EPHEDRINE SULFATE 50 MG/ML IJ SOLN
INTRAMUSCULAR | Status: AC
  Filled 2019-09-20: qty 1

## 2019-09-20 NOTE — Anesthesia Preprocedure Evaluation (Deleted)
Anesthesia Evaluation  Patient identified by MRN, date of birth, ID band Patient awake    Reviewed: Allergy & Precautions, NPO status , Patient's Chart, lab work & pertinent test results  History of Anesthesia Complications Negative for: history of anesthetic complications  Airway Mallampati: II  TM Distance: >3 FB Neck ROM: Full    Dental no notable dental hx.    Pulmonary neg sleep apnea, neg COPD, Current Smoker and Patient abstained from smoking.,    breath sounds clear to auscultation- rhonchi (-) wheezing      Cardiovascular hypertension, (-) angina+ CAD and + Cardiac Stents  (-) CABG  Rhythm:Regular Rate:Normal - Systolic murmurs and - Diastolic murmurs    Neuro/Psych neg Seizures negative neurological ROS  negative psych ROS   GI/Hepatic negative GI ROS, Neg liver ROS,   Endo/Other  diabetes, Oral Hypoglycemic Agents  Renal/GU negative Renal ROS     Musculoskeletal negative musculoskeletal ROS (+)   Abdominal (+) - obese,   Peds  Hematology negative hematology ROS (+)   Anesthesia Other Findings Past Medical History: No date: Diabetes mellitus without complication (HCC) No date: Hyperlipidemia No date: Hypertension   Reproductive/Obstetrics                             Anesthesia Physical Anesthesia Plan  ASA: III  Anesthesia Plan: General   Post-op Pain Management:    Induction: Intravenous  PONV Risk Score and Plan: 1 and Propofol infusion  Airway Management Planned: Natural Airway  Additional Equipment:   Intra-op Plan:   Post-operative Plan:   Informed Consent: I have reviewed the patients History and Physical, chart, labs and discussed the procedure including the risks, benefits and alternatives for the proposed anesthesia with the patient or authorized representative who has indicated his/her understanding and acceptance.     Dental advisory given  Plan  Discussed with: CRNA and Anesthesiologist  Anesthesia Plan Comments:         Anesthesia Quick Evaluation

## 2019-09-20 NOTE — Telephone Encounter (Signed)
Contacted patient in regards to her colonoscopy not being able to be performed today due to Plavix blood thinner not being received.  Apologized for the inconvenience of this occurring and informed her that the request was sent and we did not receive feedback prior to her procedure, but would be happy to reschedule and send another blood thinner request to the office.  She declined.  Thanks [employer]

## 2019-10-12 ENCOUNTER — Other Ambulatory Visit: Payer: Self-pay

## 2019-10-12 ENCOUNTER — Ambulatory Visit: Payer: Self-pay | Admitting: Gerontology

## 2019-10-12 ENCOUNTER — Other Ambulatory Visit: Payer: Self-pay | Admitting: Urology

## 2019-10-12 VITALS — BP 170/86 | HR 82 | Ht 63.0 in | Wt 150.0 lb

## 2019-10-12 DIAGNOSIS — E119 Type 2 diabetes mellitus without complications: Secondary | ICD-10-CM

## 2019-10-12 DIAGNOSIS — I10 Essential (primary) hypertension: Secondary | ICD-10-CM

## 2019-10-12 MED ORDER — METFORMIN HCL 1000 MG PO TABS: 1500.0000 mg | ORAL_TABLET | Freq: Every day | ORAL | 1 refills | Status: DC

## 2019-10-12 NOTE — Progress Notes (Signed)
Established Patient Office Visit  Subjective:  Patient ID: Laurie Kirby, female    DOB: [birth year]  Age: 58 y.o. MRN: [id number]  CC:  Chief Complaint  Patient presents with  . Diabetes  . Hypertension    HPI Laurie Kirby presents for follow up of hypertension and type 2 diabetes. She brought her log to clinic, and she checks her blood pressure daily. Her SBP ranges between 140-185, and DBP ranges between 89-100.  She states that she is compliant with her medication, but add extra salt to  her food.  Her last hemoglobin A1c done on 08/04/2019 was 9.4%, and she checks her blood glucose twice daily.  She brought her blood glucose log, and her fasting blood glucose readings are usually in the low 200s, and evening readings are usually between 170-200 mg/dl.  Her blood glucose was checked during visit and it was 139 mg per DL.  She denies any hypoglycemic symptoms, and peripheral neuropathy.  She states that she takes 10 mg glipizide daily, 1000 mg Metformin with breakfast and 500 mg Metformin with dinner.  She states that she can only tolerate 500 mg Metformin in the evening without experiencing nausea.  She [facility] She states that she will continue monitoring her blood pressure and blood glucose after the holidays.  She denies chest pain, palpitation, lightheadedness, cough, myalgia, bruises, hematuria, hematochezia, fever and chills.  She states that she will defer following up with her colonoscopy because after doing the prep, the procedure was canceled because she wasn't notified to stop her Plavix prior.  She states that she is doing well and offers no further complaint.  Past Medical History:  Diagnosis Date  . [place]   . Hyperlipidemia   . Hypertension     Past Surgical History:  Procedure Laterality Date  . CORONARY ANGIOPLASTY WITH STENT PLACEMENT       Family History  Problem Relation Age of Onset  . Diabetes type II Mother   . Diabetes type II Father   . Heart disease Father   . Cancer Maternal Aunt   . Breast cancer Cousin     Social History   Socioeconomic History  . Marital status: Single    Spouse name: Not on file  . Number of children: 3  . Years of education: Not on file  . Highest education level: High school graduate  Occupational History  . Occupation: unemployed  Tobacco Use  . Smoking status: Current Every Day Smoker    Types: Cigarettes    Last attempt to quit: 12/07/2013    Years since quitting: 5.8  . Smokeless tobacco: Never Used  Substance and Sexual Activity  . Alcohol use: Yes    Alcohol/week: 4.0 standard drinks    Types: 4 Glasses of wine per week    Comment:  wine on the weekends  . Drug use: No  . Sexual activity: Yes  Other Topics Concern  . Not on file  Social History Narrative   A friend supports. Not on food stamps. Pretty well taken care of by friend.    Social Determinants of Health   Financial Resource Strain:   . Difficulty of Paying Living Expenses: Not on file  Food Insecurity:   . Worried About RCharity fundraiserin the Last Year: Not on file  . Ran Out of Food in the Last Year: Not on file  Transportation Needs:   . Film/video editor (Medical): Not on file  . Lack of Transportation (Non-Medical): Not on file  Physical Activity:   . Days of Exercise per Week: Not on file  . Minutes of Exercise per Session: Not on file  Stress:   . Feeling of Stress : Not on file  Social Connections:   . Frequency of Communication with Friends and Family: Not on file  . Frequency of Social Gatherings with Friends and Family: Not on file  . Attends Religious Services: Not on file  . Active Member of Clubs or Organizations: Not on file  . Attends Archivist Meetings: Not on file  . Marital Status: Not on file  Intimate Partner Violence:   . Fear of Current or  Ex-Partner: Not on file  . Emotionally Abused: Not on file  . Physically Abused: Not on file  . Sexually Abused: Not on file    Outpatient Medications Prior to Visit  Medication Sig Dispense Refill  . Ascorbic Acid (VITAMIN C) 1000 MG tablet Take 1,000 mg by mouth daily.    Marland Kitchen aspirin 81 MG tablet Take 1 tablet (81 mg total) by mouth daily. 30 tablet 6  . atorvastatin (LIPITOR) 40 MG tablet TAKE 1 TABLET (40MG TOTAL) BY MOUTH DAILY 90 tablet 1  . clopidogrel (PLAVIX) 75 MG tablet Take 1 tablet (75 mg total) by mouth daily. 90 tablet 1  . famotidine (PEPCID) 20 MG tablet Take 1 tablet (20 mg total) by mouth 2 (two) times daily. 30 tablet 6  . glipiZIDE (GLUCOTROL) 10 MG tablet TAKE 1 TABLET BY MOUTH EVERY DAY BEFOREBREAKFAST 180 tablet 1  . ibuprofen (ADVIL) 800 MG tablet TAKE 1 TABLET BY MOUTH EVERY 8 HOURS AS NEEDED. 60 tablet 1  . isosorbide mononitrate (IMDUR) 30 MG 24 hr tablet Take 1 tablet (30 mg total) by mouth daily. 90 tablet 1  . lisinopril (ZESTRIL) 40 MG tablet TAKE 1 TABLET BY MOUTH DAILY 30 tablet 3  . metoprolol tartrate (LOPRESSOR) 50 MG tablet Take 1 tablet (50 mg total) by mouth 2 (two) times daily. 60 tablet 2  . mometasone (ELOCON) 0.1 % ointment Apply topically daily. 45 g 3  . metFORMIN (GLUCOPHAGE) 1000 MG tablet Take 1 tablet (1,000 mg total) by mouth 2 (two) times daily with a meal. 180 tablet 1  . sitaGLIPtin (JANUVIA) 50 MG tablet Take 0.5 tablets (25 mg total) by mouth daily. (Patient not taking: Reported on 09/13/2019) 30 tablet 0   No facility-administered medications prior to visit.    No Known Allergies  ROS Review of Systems  Constitutional: Negative.   Eyes: Negative.   Respiratory: Negative.   Cardiovascular: Negative.   Gastrointestinal: Negative.   Endocrine: Negative.   Genitourinary: Negative.   Skin: Negative.   Neurological: Negative.   Hematological: Negative.   Psychiatric/Behavioral: Negative.       Objective:    Physical Exam   Constitutional: She is oriented to person, place, and time. She appears well-developed.  HENT:  Head: Normocephalic and atraumatic.  Eyes: Pupils are equal, round, and reactive to light. EOM are normal.  Cardiovascular: Normal rate and regular rhythm.  Pulmonary/Chest: Effort normal and breath sounds normal.  Neurological: She is alert and oriented to person, place, and time.  Skin: Skin is warm and dry.  Psychiatric: She has a normal mood and affect. Her behavior is normal. Judgment and thought content normal.    BP (!) 170/86 (BP Location: Left Arm, Patient Position:  Sitting, Cuff Size: Large)   Pulse 82   Ht 5' 3"  (1.6 m)   Wt 150 lb (68 kg)   SpO2 99%   BMI 26.57 kg/m  Wt Readings from Last 3 Encounters:  10/12/19 150 lb (68 kg)  09/20/19 160 lb (72.6 kg)  08/12/19 151 lb (68.5 kg)     Health Maintenance Due  Topic Date Due  . Hepatitis C Screening  1961-03-13  . PNEUMOCOCCAL POLYSACCHARIDE VACCINE AGE 72-64 HIGH RISK  06/10/1964  . HIV Screening  06/10/1977  . TETANUS/TDAP  06/10/1981  . COLONOSCOPY  06/10/2012  . OPHTHALMOLOGY EXAM  05/23/2017  . PAP SMEAR-Modifier  10/10/2018    There are no preventive care reminders to display for this patient.  Lab Results  Component Value Date   TSH 1.590 05/08/2017   Lab Results  Component Value Date   WBC 15.2 (H) 08/04/2019   HGB 15.4 08/04/2019   HCT 45.4 08/04/2019   MCV 90 08/04/2019   PLT 292 08/04/2019   Lab Results  Component Value Date   NA 143 08/04/2019   K 4.1 08/04/2019   CO2 21 08/04/2019   GLUCOSE 136 (H) 08/04/2019   BUN 10 08/04/2019   CREATININE 0.59 08/04/2019   BILITOT <0.2 12/01/2018   ALKPHOS 77 12/01/2018   AST 15 12/01/2018   ALT 13 12/01/2018   PROT 7.7 12/01/2018   ALBUMIN 4.2 12/01/2018   CALCIUM 9.3 08/04/2019   ANIONGAP 7 10/02/2017   Lab Results  Component Value Date   CHOL 158 08/04/2019   Lab Results  Component Value Date   HDL 39 (L) 08/04/2019   Lab Results   Component Value Date   LDLCALC 97 08/04/2019   Lab Results  Component Value Date   TRIG 121 08/04/2019   Lab Results  Component Value Date   CHOLHDL 4.1 08/04/2019   Lab Results  Component Value Date   HGBA1C 9.4 (H) 08/04/2019      Assessment & Plan:     1. Type 2 diabetes mellitus without complication, without long-term current use of insulin (HCC) -Her hemoglobin A1c was 9.4%, but her goal should  be less than 7%.  She declines taking any injectables, and she states that she can only tolerate 1,546m of Metformin daily.  She was strongly encouraged [facility] so she can get her medications free, since [place] - metFORMIN (GLUCOPHAGE) 1000 MG tablet; Take 1.5 tablets (1,500 mg total) by mouth daily with breakfast. She takes 1000 mg at breakfast and 500 mg at dinner  Dispense: 180 tablet; Refill: 1.  She was educated on the complications of uncontrolled diabetes and was encouraged to adhere to her treatment regimen. She was advised to use Diabetic diet as advised ,Check blood sugar 2 times a day, once before breakfast and others 2 hours after lunch or dinner,write down the numbers against date in a log and bring log to clinic every visit. Take medications regularly as advised and exercises as tolerated. - HgB A1c; Future - Lipid panel; Future - Comp Met (CMET); Future  2. Essential hypertension -Her blood pressure is uncontrolled due to noncompliance with her diet.  She was strongly encouraged to adhere to DASH diet. She was educated on the complications of uncontrolled hypertension and she verbalized understanding. She will continue on current treatment regimen, will recheck blood pressure during follow up visit prior to making changes in her medication. -Take medications regularly on  time -Exercise regularly as tolerated -Check blood pressure daily, record and bring log to follow up  visit. -Goal is less than 140/90 and normal blood pressure is less than 120/80 - Comp Met (CMET); Future    Follow-up: Return in about 5 weeks (around 11/17/2019), or if symptoms worsen or fail to improve.    Josanne Boerema Jerold Coombe, NP

## 2019-10-12 NOTE — Patient Instructions (Signed)
Carbohydrate Counting for Diabetes Mellitus, Adult  Carbohydrate counting is a method of keeping track of how many carbohydrates you eat. Eating carbohydrates naturally increases the amount of sugar (glucose) in the blood. Counting how many carbohydrates you eat helps keep your blood glucose within normal limits, which helps you manage your diabetes (diabetes mellitus). It is important to know how many carbohydrates you can safely have in each meal. This is different for every person. A diet and nutrition specialist (registered dietitian) can help you make a meal plan and calculate how many carbohydrates you should have at each meal and snack. Carbohydrates are found in the following foods:  Grains, such as breads and cereals.  Dried beans and soy products.  Starchy vegetables, such as potatoes, peas, and corn.  Fruit and fruit juices.  Milk and yogurt.  Sweets and snack foods, such as cake, cookies, candy, chips, and soft drinks. How do I count carbohydrates? There are two ways to count carbohydrates in food. You can use either of the methods or a combination of both. Reading "Nutrition Facts" on packaged food The "Nutrition Facts" list is included on the labels of almost all packaged foods and beverages in the [place] It includes:  The serving size.  Information about nutrients in each serving, including the grams (g) of carbohydrate per serving. To use the "Nutrition Facts":  Decide how many servings you will have.  Multiply the number of servings by the number of carbohydrates per serving.  The resulting number is the total amount of carbohydrates that you will be having. Learning standard serving sizes of other foods When you eat carbohydrate foods that are not packaged or do not include "Nutrition Facts" on the label, you need to measure the servings in order to count the amount of carbohydrates:  Measure the foods that you will eat with a food scale or measuring cup, if needed.   Decide how many standard-size servings you will eat.  Multiply the number of servings by 15. Most carbohydrate-rich foods have about 15 g of carbohydrates per serving. ? For example, if you eat 8 oz (170 g) of strawberries, you will have eaten 2 servings and 30 g of carbohydrates (2 servings x 15 g = 30 g).  For foods that have more than one food mixed, such as soups and casseroles, you must count the carbohydrates in each food that is included. The following list contains standard serving sizes of common carbohydrate-rich foods. Each of these servings has about 15 g of carbohydrates:   hamburger bun or  English muffin.   oz (15 mL) syrup.   oz (14 g) jelly.  1 slice of bread.  1 six-inch tortilla.  3 oz (85 g) cooked rice or pasta.  4 oz (113 g) cooked dried beans.  4 oz (113 g) starchy vegetable, such as peas, corn, or potatoes.  4 oz (113 g) hot cereal.  4 oz (113 g) mashed potatoes or  of a large baked potato.  4 oz (113 g) canned or frozen fruit.  4 oz (120 mL) fruit juice.  4-6 crackers.  6 chicken nuggets.  6 oz (170 g) unsweetened dry cereal.  6 oz (170 g) plain fat-free yogurt or yogurt sweetened with artificial sweeteners.  8 oz (240 mL) milk.  8 oz (170 g) fresh fruit or one small piece of fruit.  24 oz (680 g) popped popcorn. Example of carbohydrate counting Sample meal  3 oz (85 g) chicken breast.  6 oz (170 g)   brown rice.  4 oz (113 g) corn.  8 oz (240 mL) milk.  8 oz (170 g) strawberries with sugar-free whipped topping. Carbohydrate calculation 1. Identify the foods that contain carbohydrates: ? Rice. ? Corn. ? Milk. ? Strawberries. 2. Calculate how many servings you have of each food: ? 2 servings rice. ? 1 serving corn. ? 1 serving milk. ? 1 serving strawberries. 3. Multiply each number of servings by 15 g: ? 2 servings rice x 15 g = 30 g. ? 1 serving corn x 15 g = 15 g. ? 1 serving milk x 15 g = 15 g. ? 1 serving  strawberries x 15 g = 15 g. 4. Add together all of the amounts to find the total grams of carbohydrates eaten: ? 30 g + 15 g + 15 g + 15 g = 75 g of carbohydrates total. Summary  Carbohydrate counting is a method of keeping track of how many carbohydrates you eat.  Eating carbohydrates naturally increases the amount of sugar (glucose) in the blood.  Counting how many carbohydrates you eat helps keep your blood glucose within normal limits, which helps you manage your diabetes.  A diet and nutrition specialist (registered dietitian) can help you make a meal plan and calculate how many carbohydrates you should have at each meal and snack. This information is not intended to replace advice given to you by your health care provider. Make sure you discuss any questions you have with your health care provider. Document Released: 10/14/2005 Document Revised: 05/08/2017 Document Reviewed: 03/27/2016 Elsevier Patient Education  2020 [employer] DASH Eating Plan DASH stands for "Dietary Approaches to Stop Hypertension." The DASH eating plan is a healthy eating plan that has been shown to reduce high blood pressure (hypertension). It may also reduce your risk for type 2 diabetes, heart disease, and stroke. The DASH eating plan may also help with weight loss. What are tips for following this plan?  General guidelines  Avoid eating more than 2,300 mg (milligrams) of salt (sodium) a day. If you have hypertension, you may need to reduce your sodium intake to 1,500 mg a day.  Limit alcohol intake to no more than 1 drink a day for nonpregnant women and 2 drinks a day for men. One drink equals 12 oz of beer, 5 oz of wine, or 1 oz of hard liquor.  Work with your health care provider to maintain a healthy body weight or to lose weight. Ask what an ideal weight is for you.  Get at least 30 minutes of exercise that causes your heart to beat faster (aerobic exercise) most days of the week. Activities may  include walking, swimming, or biking.  Work with your health care provider or diet and nutrition specialist (dietitian) to adjust your eating plan to your individual calorie needs. Reading food labels   Check food labels for the amount of sodium per serving. Choose foods with less than 5 percent of the Daily Value of sodium. Generally, foods with less than 300 mg of sodium per serving fit into this eating plan.  To find whole grains, look for the word "whole" as the first word in the ingredient list. Shopping  Buy products labeled as "low-sodium" or "no salt added."  Buy fresh foods. Avoid canned foods and premade or frozen meals. Cooking  Avoid adding salt when cooking. Use salt-free seasonings or herbs instead of table salt or sea salt. Check with your health care provider or pharmacist before using salt substitutes.    Do not fry foods. Cook foods using healthy methods such as baking, boiling, grilling, and broiling instead.  Cook with heart-healthy oils, such as olive, canola, soybean, or sunflower oil. Meal planning  Eat a balanced diet that includes: ? 5 or more servings of fruits and vegetables each day. At each meal, try to fill half of your plate with fruits and vegetables. ? Up to 6-8 servings of whole grains each day. ? Less than 6 oz of lean meat, poultry, or fish each day. A 3-oz serving of meat is about the same size as a deck of cards. One egg equals 1 oz. ? 2 servings of low-fat dairy each day. ? A serving of nuts, seeds, or beans 5 times each week. ? Heart-healthy fats. Healthy fats called Omega-3 fatty acids are found in foods such as flaxseeds and coldwater fish, like sardines, salmon, and mackerel.  Limit how much you eat of the following: ? Canned or prepackaged foods. ? Food that is high in trans fat, such as fried foods. ? Food that is high in saturated fat, such as fatty meat. ? Sweets, desserts, sugary drinks, and other foods with added sugar. ? Full-fat  dairy products.  Do not salt foods before eating.  Try to eat at least 2 vegetarian meals each week.  Eat more home-cooked food and less restaurant, buffet, and fast food.  When eating at a restaurant, ask that your food be prepared with less salt or no salt, if possible. What foods are recommended? The items listed may not be a complete list. Talk with your dietitian about what dietary choices are best for you. Grains Whole-grain or whole-wheat bread. Whole-grain or whole-wheat pasta. Brown rice. Oatmeal. Quinoa. Bulgur. Whole-grain and low-sodium cereals. Pita bread. Low-fat, low-sodium crackers. Whole-wheat flour tortillas. Vegetables Fresh or frozen vegetables (raw, steamed, roasted, or grilled). Low-sodium or reduced-sodium tomato and vegetable juice. Low-sodium or reduced-sodium tomato sauce and tomato paste. Low-sodium or reduced-sodium canned vegetables. Fruits All fresh, dried, or frozen fruit. Canned fruit in natural juice (without added sugar). Meat and other protein foods Skinless chicken or turkey. Ground chicken or turkey. Pork with fat trimmed off. Fish and seafood. Egg whites. Dried beans, peas, or lentils. Unsalted nuts, nut butters, and seeds. Unsalted canned beans. Lean cuts of beef with fat trimmed off. Low-sodium, lean deli meat. Dairy Low-fat (1%) or fat-free (skim) milk. Fat-free, low-fat, or reduced-fat cheeses. Nonfat, low-sodium ricotta or cottage cheese. Low-fat or nonfat yogurt. Low-fat, low-sodium cheese. Fats and oils Soft margarine without trans fats. Vegetable oil. Low-fat, reduced-fat, or light mayonnaise and salad dressings (reduced-sodium). Canola, safflower, olive, soybean, and sunflower oils. Avocado. Seasoning and other foods Herbs. Spices. Seasoning mixes without salt. Unsalted popcorn and pretzels. Fat-free sweets. What foods are not recommended? The items listed may not be a complete list. Talk with your dietitian about what dietary choices are best  for you. Grains Baked goods made with fat, such as croissants, muffins, or some breads. Dry pasta or rice meal packs. Vegetables Creamed or fried vegetables. Vegetables in a cheese sauce. Regular canned vegetables (not low-sodium or reduced-sodium). Regular canned tomato sauce and paste (not low-sodium or reduced-sodium). Regular tomato and vegetable juice (not low-sodium or reduced-sodium). Pickles. Olives. Fruits Canned fruit in a light or heavy syrup. Fried fruit. Fruit in cream or butter sauce. Meat and other protein foods Fatty cuts of meat. Ribs. Fried meat. Bacon. Sausage. Bologna and other processed lunch meats. Salami. Fatback. Hotdogs. Bratwurst. Salted nuts and seeds. Canned beans with   added salt. Canned or smoked fish. Whole eggs or egg yolks. Chicken or turkey with skin. Dairy Whole or 2% milk, cream, and half-and-half. Whole or full-fat cream cheese. Whole-fat or sweetened yogurt. Full-fat cheese. Nondairy creamers. Whipped toppings. Processed cheese and cheese spreads. Fats and oils Butter. Stick margarine. Lard. Shortening. Ghee. Bacon fat. Tropical oils, such as coconut, palm kernel, or palm oil. Seasoning and other foods Salted popcorn and pretzels. Onion salt, garlic salt, seasoned salt, table salt, and sea salt. Worcestershire sauce. Tartar sauce. Barbecue sauce. Teriyaki sauce. Soy sauce, including reduced-sodium. Steak sauce. Canned and packaged gravies. Fish sauce. Oyster sauce. Cocktail sauce. Horseradish that you find on the shelf. Ketchup. Mustard. Meat flavorings and tenderizers. Bouillon cubes. Hot sauce and Tabasco sauce. Premade or packaged marinades. Premade or packaged taco seasonings. Relishes. Regular salad dressings. Where to find more information:  National Heart, Lung, and Blood Institute: www.nhlbi.nih.gov  American Heart Association: www.heart.org Summary  The DASH eating plan is a healthy eating plan that has been shown to reduce high blood pressure  (hypertension). It may also reduce your risk for type 2 diabetes, heart disease, and stroke.  With the DASH eating plan, you should limit salt (sodium) intake to 2,300 mg a day. If you have hypertension, you may need to reduce your sodium intake to 1,500 mg a day.  When on the DASH eating plan, aim to eat more fresh fruits and vegetables, whole grains, lean proteins, low-fat dairy, and heart-healthy fats.  Work with your health care provider or diet and nutrition specialist (dietitian) to adjust your eating plan to your individual calorie needs. This information is not intended to replace advice given to you by your health care provider. Make sure you discuss any questions you have with your health care provider. Document Released: 10/03/2011 Document Revised: 09/26/2017 Document Reviewed: 10/07/2016 Elsevier Patient Education  2020 Elsevier Inc.  

## 2019-10-13 ENCOUNTER — Other Ambulatory Visit: Payer: Self-pay | Admitting: Urology

## 2019-10-18 ENCOUNTER — Other Ambulatory Visit: Payer: Self-pay | Admitting: Urology

## 2019-11-11 ENCOUNTER — Other Ambulatory Visit: Payer: Self-pay

## 2019-11-11 DIAGNOSIS — E119 Type 2 diabetes mellitus without complications: Secondary | ICD-10-CM

## 2019-11-11 DIAGNOSIS — I10 Essential (primary) hypertension: Secondary | ICD-10-CM

## 2019-11-12 LAB — COMPREHENSIVE METABOLIC PANEL
ALT: 12 IU/L (ref 0–32)
AST: 12 IU/L (ref 0–40)
Albumin/Globulin Ratio: 1.4 (ref 1.2–2.2)
Albumin: 4.2 g/dL (ref 3.8–4.9)
Alkaline Phosphatase: 66 IU/L (ref 39–117)
BUN/Creatinine Ratio: 21 (ref 9–23)
BUN: 13 mg/dL (ref 6–24)
Bilirubin Total: <0.2 mg/dL (ref 0.0–1.2)
CO2: 25 mmol/L (ref 20–29)
Calcium: 9.8 mg/dL (ref 8.7–10.2)
Chloride: 105 mmol/L (ref 96–106)
Creatinine, Ser: 0.61 mg/dL (ref 0.57–1.00)
GFR calc Af Amer: 116 mL/min/{1.73_m2} (ref >59)
GFR calc non Af Amer: 101 mL/min/{1.73_m2} (ref >59)
Globulin, Total: 3.1 g/dL (ref 1.5–4.5)
Glucose: 140 mg/dL — ABNORMAL HIGH (ref 65–99)
Potassium: 4.1 mmol/L (ref 3.5–5.2)
Sodium: 142 mmol/L (ref 134–144)
Total Protein: 7.3 g/dL (ref 6.0–8.5)

## 2019-11-12 LAB — LIPID PANEL
Chol/HDL Ratio: 4.5 ratio — ABNORMAL HIGH (ref 0.0–4.4)
Cholesterol, Total: 227 mg/dL — ABNORMAL HIGH (ref 100–199)
HDL: 51 mg/dL (ref >39)
LDL Chol Calc (NIH): 145 mg/dL — ABNORMAL HIGH (ref 0–99)
Triglycerides: 171 mg/dL — ABNORMAL HIGH (ref 0–149)
VLDL Cholesterol Cal: 31 mg/dL (ref 5–40)

## 2019-11-12 LAB — HEMOGLOBIN A1C
Est. average glucose Bld gHb Est-mCnc: 169 mg/dL
Hgb A1c MFr Bld: 7.5 % — ABNORMAL HIGH (ref 4.8–5.6)

## 2019-11-16 ENCOUNTER — Ambulatory Visit: Payer: Self-pay | Admitting: Internal Medicine

## 2019-11-16 ENCOUNTER — Other Ambulatory Visit: Payer: Self-pay

## 2019-11-16 DIAGNOSIS — E119 Type 2 diabetes mellitus without complications: Secondary | ICD-10-CM

## 2019-11-16 MED ORDER — ASPIRIN EC 81 MG PO TBEC: 81.0000 mg | DELAYED_RELEASE_TABLET | Freq: Every day | ORAL | 3 refills | Status: DC

## 2019-11-16 MED ORDER — GLIPIZIDE 10 MG PO TABS: 10.0000 mg | ORAL_TABLET | Freq: Two times a day (BID) | ORAL | 3 refills | Status: DC

## 2019-11-16 NOTE — Progress Notes (Signed)
New Diabetes Consultation [facility]     Patient ID: Laurie Kirby, female   DOB: Jun 01, 1961, 59 y.o.   MRN: [id number] Assessment:  Laurie Kirby is a 59 y.o. female who is seen in consultation for T2DM management at the request of [name], [name], NP.   Endocrine Narrative Assessment:  Laurie Kirby is a 59yo woman with a history of HTN and T2DM. Her A1c this week was 7.5, near the goal of 7%. Given she refuses Januvia and any insulin, patient was recommended to increase Glipizide to 71m/day. Patient was also counseled on diet and exercise that will help with decreasing fasting glucose <130 and A1c <7%.  Plan:  1. Take 2102mGlipizide/day (1057mid) 2. Continue 1000m89mtformin (500mg80m) 3. Follow low-carb diet to reduce diarrhea with Metformin and decrease fasting sugar 4. Follow up with endocrine [facility]. 5. [facility]   No orders of the defined types were placed in this encounter.  Subjective:  Laurie Kirby 58yo 24yon with a history of T2DM and HTN presenting for follow-up on T2DM management. Today she reports doing well overall, her biggest concern being not having to take so much medication. This week her A1c was 7.5%, down from 9.4% in October 2020. She has been out of blood glucose strips since early Jan, when her fasting sugars were in the low 200s/high 190s. She reports diarrhea with 1000mg 4mormin and not tolerating 1500mg. 67malso reports she will not take Januvia which has been recommended, because she doesn't want to take any more pills. No blurry vision or neuropathy.   Review of Systems  Eyes: Negative for visual disturbance.  Gastrointestinal: Positive for diarrhea.  Neurological: Negative for numbness.        Laurie Kirby past medical history of Diabetes mellitus without complication (HCC), HCrookrlipidemia, and Hypertension.  Laurie Kirby history includes Breast cancer in her cousin;  Cancer in her maternal aunt; Diabetes type II in her father and mother; Heart disease in her father.  Laurie Kirby that she has been smoking cigarettes. She has never used smokeless tobacco. She reports current alcohol use of about 4.0 standard drinks of alcohol per week. She reports that she does not use drugs.   Current Outpatient Medications:  .  Ascorbic Acid (VITAMIN C) 1000 MG tablet, Take 1,000 mg by mouth daily., Disp: , Rfl:  .  aspirin 81 MG tablet, Take 1 tablet (81 mg total) by mouth daily., Disp: 30 tablet, Rfl: 6 .  atorvastatin (LIPITOR) 40 MG tablet, TAKE 1 TABLET (40MG TOTAL) BY MOUTH DAILY, Disp: 90 tablet, Rfl: 1 .  clopidogrel (PLAVIX) 75 MG tablet, Take 1 tablet (75 mg total) by mouth daily., Disp: 90 tablet, Rfl: 1 .  famotidine (PEPCID) 20 MG tablet, Take 1 tablet (20 mg total) by mouth 2 (two) times daily., Disp: 30 tablet, Rfl: 6 .  glipiZIDE (GLUCOTROL) 10 MG tablet, TAKE 1 TABLET BY MOUTH EVERY DAY BEFOREBREAKFAST, Disp: 180 tablet, Rfl: 1 .  ibuprofen (ADVIL) 800 MG tablet, TAKE 1 TABLET BY MOUTH EVERY 8 HOURS AS NEEDED., Disp: 60 tablet, Rfl: 1 .  isosorbide mononitrate (IMDUR) 30 MG 24 hr tablet, Take 1 tablet (30 mg total) by mouth daily., Disp: 90 tablet, Rfl: 1 .  lisinopril (ZESTRIL) 40 MG tablet, TAKE 1 TABLET BY MOUTH DAILY, Disp: 30 tablet, Rfl: 3 .  metFORMIN (GLUCOPHAGE) 1000 MG tablet, Take 1.5  tablets (1,500 mg total) by mouth daily with breakfast. She takes 1000 mg at breakfast and 500 mg at dinner, Disp: 180 tablet, Rfl: 1 .  metoprolol tartrate (LOPRESSOR) 50 MG tablet, Take 1 tablet (50 mg total) by mouth 2 (two) times daily., Disp: 60 tablet, Rfl: 2 .  mometasone (ELOCON) 0.1 % ointment, Apply topically daily., Disp: 45 g, Rfl: 3 .  sitaGLIPtin (JANUVIA) 50 MG tablet, Take 0.5 tablets (25 mg total) by mouth daily. (Patient not taking: Reported on 09/13/2019), Disp: 30 tablet, Rfl: 0  No Known Allergies Objective:    Physical Exam       Data/Results/Labs  : I have personally reviewed pertinent labs and imaging studies, if indicated,  with the patient in clinic today.  Results for orders placed or performed in visit on 11/11/19 (from the past 672 hour(s))  Comp Met (CMET)   Collection Time: 11/11/19  7:06 PM  Result Value Ref Range   Glucose 140 (H) 65 - 99 mg/dL   BUN 13 6 - 24 mg/dL   Creatinine, Ser 0.61 0.57 - 1.00 mg/dL   GFR calc non Af Amer 101 >59 mL/min/1.73   GFR calc Af Amer 116 >59 mL/min/1.73   BUN/Creatinine Ratio 21 9 - 23   Sodium 142 134 - 144 mmol/L   Potassium 4.1 3.5 - 5.2 mmol/L   Chloride 105 96 - 106 mmol/L   CO2 25 20 - 29 mmol/L   Calcium 9.8 8.7 - 10.2 mg/dL   Total Protein 7.3 6.0 - 8.5 g/dL   Albumin 4.2 3.8 - 4.9 g/dL   Globulin, Total 3.1 1.5 - 4.5 g/dL   Albumin/Globulin Ratio 1.4 1.2 - 2.2   Bilirubin Total <0.2 0.0 - 1.2 mg/dL   Alkaline Phosphatase 66 39 - 117 IU/L   AST 12 0 - 40 IU/L   ALT 12 0 - 32 IU/L  Lipid panel   Collection Time: 11/11/19  7:06 PM  Result Value Ref Range   Cholesterol, Total 227 (H) 100 - 199 mg/dL   Triglycerides 171 (H) 0 - 149 mg/dL   HDL 51 >39 mg/dL   VLDL Cholesterol Cal 31 5 - 40 mg/dL   LDL Chol Calc (NIH) 145 (H) 0 - 99 mg/dL   Chol/HDL Ratio 4.5 (H) 0.0 - 4.4 ratio  HgB A1c   Collection Time: 11/11/19  7:06 PM  Result Value Ref Range   Hgb A1c MFr Bld 7.5 (H) 4.8 - 5.6 %   Est. average glucose Bld gHb Est-mCnc 169 mg/dL  ]  HC Readings from Last 3 Encounters:  No data found for [facility]    Wt Readings from Last 3 Encounters:  10/12/19 150 lb (68 kg)  09/20/19 160 lb (72.6 kg)  08/12/19 151 lb (68.5 kg)

## 2019-11-17 ENCOUNTER — Other Ambulatory Visit: Payer: Self-pay | Admitting: Gerontology

## 2019-11-17 ENCOUNTER — Ambulatory Visit: Payer: Self-pay | Admitting: Gerontology

## 2019-11-17 ENCOUNTER — Other Ambulatory Visit: Payer: Self-pay

## 2019-11-17 ENCOUNTER — Encounter: Payer: Self-pay | Admitting: Gerontology

## 2019-11-17 VITALS — BP 153/95 | HR 83 | Ht 64.0 in | Wt 149.0 lb

## 2019-11-17 DIAGNOSIS — E119 Type 2 diabetes mellitus without complications: Secondary | ICD-10-CM

## 2019-11-17 DIAGNOSIS — I10 Essential (primary) hypertension: Secondary | ICD-10-CM

## 2019-11-17 DIAGNOSIS — E785 Hyperlipidemia, unspecified: Secondary | ICD-10-CM

## 2019-11-17 NOTE — Patient Instructions (Signed)
Smoking Tobacco Information, Adult Smoking tobacco can be harmful to your health. Tobacco contains a poisonous (toxic), colorless chemical called nicotine. Nicotine is addictive. It changes the brain and can make it hard to stop smoking. Tobacco also has other toxic chemicals that can hurt your body and raise your risk of many cancers. How can smoking tobacco affect me? Smoking tobacco puts you at risk for:  Cancer. Smoking is most commonly associated with lung cancer, but can also lead to cancer in other parts of the body.  Chronic obstructive pulmonary disease (COPD). This is a long-term lung condition that makes it hard to breathe. It also gets worse over time.  High blood pressure (hypertension), heart disease, stroke, or heart attack.  Lung infections, such as pneumonia.  Cataracts. This is when the lenses in the eyes become clouded.  Digestive problems. This may include peptic ulcers, heartburn, and gastroesophageal reflux disease (GERD).  Oral health problems, such as gum disease and tooth loss.  Loss of taste and smell. Smoking can affect your appearance by causing:  Wrinkles.  Yellow or stained teeth, fingers, and fingernails. Smoking tobacco can also affect your social life, because:  It may be challenging to find places to smoke when away from home. Many workplaces, restaurants, hotels, and public places are tobacco-free.  Smoking is expensive. This is due to the cost of tobacco and the long-term costs of treating health problems from smoking.  Secondhand smoke may affect those around you. Secondhand smoke can cause lung cancer, breathing problems, and heart disease. Children of smokers have a higher risk for: ? Sudden infant death syndrome (SIDS). ? Ear infections. ? Lung infections. If you currently smoke tobacco, quitting now can help you:  Lead a longer and healthier life.  Look, smell, breathe, and feel better over time.  Save money.  Protect others from the  harms of secondhand smoke. What actions can I take to prevent health problems? Quit smoking   Do not start smoking. Quit if you already do.  Make a plan to quit smoking and commit to it. Look for programs to help you and ask your health care provider for recommendations and ideas.  Set a date and write down all the reasons you want to quit.  Let your friends and family know you are quitting so they can help and support you. Consider finding friends who also want to quit. It can be easier to quit with someone else, so that you can support each other.  Talk with your health care provider about using nicotine replacement medicines to help you quit, such as gum, lozenges, patches, sprays, or pills.  Do not replace cigarette smoking with electronic cigarettes, which are commonly called e-cigarettes. The safety of e-cigarettes is not known, and some may contain harmful chemicals.  If you try to quit but return to smoking, stay positive. It is common to slip up when you first quit, so take it one day at a time.  Be prepared for cravings. When you feel the urge to smoke, chew gum or suck on hard candy. Lifestyle  Stay busy and take care of your body.  Drink enough fluid to keep your urine pale yellow.  Get plenty of exercise and eat a healthy diet. This can help prevent weight gain after quitting.  Monitor your eating habits. Quitting smoking can cause you to have a larger appetite than when you smoke.  Find ways to relax. Go out with friends or family to a movie or a restaurant   where people do not smoke.  Ask your health care provider about having regular tests (screenings) to check for cancer. This may include blood tests, imaging tests, and other tests.  Find ways to manage your stress, such as meditation, yoga, or exercise. Where to find support To get support to quit smoking, consider:  Asking your health care provider for more information and resources.  Taking classes to learn  more about quitting smoking.  Looking for local organizations that offer resources about quitting smoking.  Joining a support group for people who want to quit smoking in your local community.  Calling the smokefree.gov counselor helpline: 1-800-Quit-Now (1-800-784-8669) Where to find more information You may find more information about quitting smoking from:  HelpGuide.org: www.helpguide.org  Smokefree.gov: smokefree.gov  American Lung Association: www.lung.org Contact a health care provider if you:  Have problems breathing.  Notice that your lips, nose, or fingers turn blue.  Have chest pain.  Are coughing up blood.  Feel faint or you pass out.  Have other health changes that cause you to worry. Summary  Smoking tobacco can negatively affect your health, the health of those around you, your finances, and your social life.  Do not start smoking. Quit if you already do. If you need help quitting, ask your health care provider.  Think about joining a support group for people who want to quit smoking in your local community. There are many effective programs that will help you to quit this behavior. This information is not intended to replace advice given to you by your health care provider. Make sure you discuss any questions you have with your health care provider. Document Revised: 07/09/2019 Document Reviewed: 10/29/2016 Elsevier Patient Education  2020 [employer] Fat and Cholesterol Restricted Eating Plan Getting too much fat and cholesterol in your diet may cause health problems. Choosing the right foods helps keep your fat and cholesterol at normal levels. This can keep you from getting certain diseases. Your doctor may recommend an eating plan that includes:  Total fat: ______% or less of total calories a day.  Saturated fat: ______% or less of total calories a day.  Cholesterol: less than _________mg a day.  Fiber: ______g a day. What are tips for following  this plan? Meal planning  At meals, divide your plate into four equal parts: ? Fill one-half of your plate with vegetables and green salads. ? Fill one-fourth of your plate with whole grains. ? Fill one-fourth of your plate with low-fat (lean) protein foods.  Eat fish that is high in omega-3 fats at least two times a week. This includes mackerel, tuna, sardines, and salmon.  Eat foods that are high in fiber, such as whole grains, beans, apples, broccoli, carrots, peas, and barley. General tips   Work with your doctor to lose weight if you need to.  Avoid: ? Foods with added sugar. ? Fried foods. ? Foods with partially hydrogenated oils.  Limit alcohol intake to no more than 1 drink a day for nonpregnant women and 2 drinks a day for men. One drink equals 12 oz of beer, 5 oz of wine, or 1 oz of hard liquor. Reading food labels  Check food labels for: ? Trans fats. ? Partially hydrogenated oils. ? Saturated fat (g) in each serving. ? Cholesterol (mg) in each serving. ? Fiber (g) in each serving.  Choose foods with healthy fats, such as: ? Monounsaturated fats. ? Polyunsaturated fats. ? Omega-3 fats.  Choose grain products that have whole grains.   Look for the word "whole" as the first word in the ingredient list. Cooking  Cook foods using low-fat methods. These include baking, boiling, grilling, and broiling.  Eat more home-cooked foods. Eat at restaurants and buffets less often.  Avoid cooking using saturated fats, such as butter, cream, palm oil, palm kernel oil, and coconut oil. Recommended foods  Fruits  All fresh, canned (in natural juice), or frozen fruits. Vegetables  Fresh or frozen vegetables (raw, steamed, roasted, or grilled). Green salads. Grains  Whole grains, such as whole wheat or whole grain breads, crackers, cereals, and pasta. Unsweetened oatmeal, bulgur, barley, quinoa, or brown rice. Corn or whole wheat flour tortillas. Meats and other protein  foods  Ground beef (85% or leaner), grass-fed beef, or beef trimmed of fat. Skinless chicken or turkey. Ground chicken or turkey. Pork trimmed of fat. All fish and seafood. Egg whites. Dried beans, peas, or lentils. Unsalted nuts or seeds. Unsalted canned beans. Nut butters without added sugar or oil. Dairy  Low-fat or nonfat dairy products, such as skim or 1% milk, 2% or reduced-fat cheeses, low-fat and fat-free ricotta or cottage cheese, or plain low-fat and nonfat yogurt. Fats and oils  Tub margarine without trans fats. Light or reduced-fat mayonnaise and salad dressings. Avocado. Olive, canola, sesame, or safflower oils. The items listed above may not be a complete list of foods and beverages you can eat. Contact a dietitian for more information. Foods to avoid Fruits  Canned fruit in heavy syrup. Fruit in cream or butter sauce. Fried fruit. Vegetables  Vegetables cooked in cheese, cream, or butter sauce. Fried vegetables. Grains  Hinote bread. Kimberling pasta. Nylund rice. Cornbread. Bagels, pastries, and croissants. Crackers and snack foods that contain trans fat and hydrogenated oils. Meats and other protein foods  Fatty cuts of meat. Ribs, chicken wings, bacon, sausage, bologna, salami, chitterlings, fatback, hot dogs, bratwurst, and packaged lunch meats. Liver and organ meats. Whole eggs and egg yolks. Chicken and turkey with skin. Fried meat. Dairy  Whole or 2% milk, cream, half-and-half, and cream cheese. Whole milk cheeses. Whole-fat or sweetened yogurt. Full-fat cheeses. Nondairy creamers and whipped toppings. Processed cheese, cheese spreads, and cheese curds. Beverages  Alcohol. Sugar-sweetened drinks such as sodas, lemonade, and fruit drinks. Fats and oils  Butter, stick margarine, lard, shortening, ghee, or bacon fat. Coconut, palm kernel, and palm oils. Sweets and desserts  Corn syrup, sugars, honey, and molasses. Candy. Jam and jelly. Syrup. Sweetened cereals. Cookies,  pies, cakes, donuts, muffins, and ice cream. The items listed above may not be a complete list of foods and beverages you should avoid. Contact a dietitian for more information. Summary  Choosing the right foods helps keep your fat and cholesterol at normal levels. This can keep you from getting certain diseases.  At meals, fill one-half of your plate with vegetables and green salads.  Eat high-fiber foods, like whole grains, beans, apples, carrots, peas, and barley.  Limit added sugar, saturated fats, alcohol, and fried foods. This information is not intended to replace advice given to you by your health care provider. Make sure you discuss any questions you have with your health care provider. Document Revised: 06/17/2018 Document Reviewed: 07/01/2017 Elsevier Patient Education  2020 [employer] DASH Eating Plan DASH stands for "Dietary Approaches to Stop Hypertension." The DASH eating plan is a healthy eating plan that has been shown to reduce high blood pressure (hypertension). It may also reduce your risk for type 2 diabetes, heart disease, and stroke. The   DASH eating plan may also help with weight loss. What are tips for following this plan?  General guidelines  Avoid eating more than 2,300 mg (milligrams) of salt (sodium) a day. If you have hypertension, you may need to reduce your sodium intake to 1,500 mg a day.  Limit alcohol intake to no more than 1 drink a day for nonpregnant women and 2 drinks a day for men. One drink equals 12 oz of beer, 5 oz of wine, or 1 oz of hard liquor.  Work with your health care provider to maintain a healthy body weight or to lose weight. Ask what an ideal weight is for you.  Get at least 30 minutes of exercise that causes your heart to beat faster (aerobic exercise) most days of the week. Activities may include walking, swimming, or biking.  Work with your health care provider or diet and nutrition specialist (dietitian) to adjust your eating  plan to your individual calorie needs. Reading food labels   Check food labels for the amount of sodium per serving. Choose foods with less than 5 percent of the Daily Value of sodium. Generally, foods with less than 300 mg of sodium per serving fit into this eating plan.  To find whole grains, look for the word "whole" as the first word in the ingredient list. Shopping  Buy products labeled as "low-sodium" or "no salt added."  Buy fresh foods. Avoid canned foods and premade or frozen meals. Cooking  Avoid adding salt when cooking. Use salt-free seasonings or herbs instead of table salt or sea salt. Check with your health care provider or pharmacist before using salt substitutes.  Do not fry foods. Cook foods using healthy methods such as baking, boiling, grilling, and broiling instead.  Cook with heart-healthy oils, such as olive, canola, soybean, or sunflower oil. Meal planning  Eat a balanced diet that includes: ? 5 or more servings of fruits and vegetables each day. At each meal, try to fill half of your plate with fruits and vegetables. ? Up to 6-8 servings of whole grains each day. ? Less than 6 oz of lean meat, poultry, or fish each day. A 3-oz serving of meat is about the same size as a deck of cards. One egg equals 1 oz. ? 2 servings of low-fat dairy each day. ? A serving of nuts, seeds, or beans 5 times each week. ? Heart-healthy fats. Healthy fats called Omega-3 fatty acids are found in foods such as flaxseeds and coldwater fish, like sardines, salmon, and mackerel.  Limit how much you eat of the following: ? Canned or prepackaged foods. ? Food that is high in trans fat, such as fried foods. ? Food that is high in saturated fat, such as fatty meat. ? Sweets, desserts, sugary drinks, and other foods with added sugar. ? Full-fat dairy products.  Do not salt foods before eating.  Try to eat at least 2 vegetarian meals each week.  Eat more home-cooked food and less  restaurant, buffet, and fast food.  When eating at a restaurant, ask that your food be prepared with less salt or no salt, if possible. What foods are recommended? The items listed may not be a complete list. Talk with your dietitian about what dietary choices are best for you. Grains Whole-grain or whole-wheat bread. Whole-grain or whole-wheat pasta. Brown rice. Oatmeal. Quinoa. Bulgur. Whole-grain and low-sodium cereals. Pita bread. Low-fat, low-sodium crackers. Whole-wheat flour tortillas. Vegetables Fresh or frozen vegetables (raw, steamed, roasted, or grilled). Low-sodium   or reduced-sodium tomato and vegetable juice. Low-sodium or reduced-sodium tomato sauce and tomato paste. Low-sodium or reduced-sodium canned vegetables. Fruits All fresh, dried, or frozen fruit. Canned fruit in natural juice (without added sugar). Meat and other protein foods Skinless chicken or turkey. Ground chicken or turkey. Pork with fat trimmed off. Fish and seafood. Egg whites. Dried beans, peas, or lentils. Unsalted nuts, nut butters, and seeds. Unsalted canned beans. Lean cuts of beef with fat trimmed off. Low-sodium, lean deli meat. Dairy Low-fat (1%) or fat-free (skim) milk. Fat-free, low-fat, or reduced-fat cheeses. Nonfat, low-sodium ricotta or cottage cheese. Low-fat or nonfat yogurt. Low-fat, low-sodium cheese. Fats and oils Soft margarine without trans fats. Vegetable oil. Low-fat, reduced-fat, or light mayonnaise and salad dressings (reduced-sodium). Canola, safflower, olive, soybean, and sunflower oils. Avocado. Seasoning and other foods Herbs. Spices. Seasoning mixes without salt. Unsalted popcorn and pretzels. Fat-free sweets. What foods are not recommended? The items listed may not be a complete list. Talk with your dietitian about what dietary choices are best for you. Grains Baked goods made with fat, such as croissants, muffins, or some breads. Dry pasta or rice meal packs. Vegetables Creamed or  fried vegetables. Vegetables in a cheese sauce. Regular canned vegetables (not low-sodium or reduced-sodium). Regular canned tomato sauce and paste (not low-sodium or reduced-sodium). Regular tomato and vegetable juice (not low-sodium or reduced-sodium). Pickles. Olives. Fruits Canned fruit in a light or heavy syrup. Fried fruit. Fruit in cream or butter sauce. Meat and other protein foods Fatty cuts of meat. Ribs. Fried meat. Bacon. Sausage. Bologna and other processed lunch meats. Salami. Fatback. Hotdogs. Bratwurst. Salted nuts and seeds. Canned beans with added salt. Canned or smoked fish. Whole eggs or egg yolks. Chicken or turkey with skin. Dairy Whole or 2% milk, cream, and half-and-half. Whole or full-fat cream cheese. Whole-fat or sweetened yogurt. Full-fat cheese. Nondairy creamers. Whipped toppings. Processed cheese and cheese spreads. Fats and oils Butter. Stick margarine. Lard. Shortening. Ghee. Bacon fat. Tropical oils, such as coconut, palm kernel, or palm oil. Seasoning and other foods Salted popcorn and pretzels. Onion salt, garlic salt, seasoned salt, table salt, and sea salt. Worcestershire sauce. Tartar sauce. Barbecue sauce. Teriyaki sauce. Soy sauce, including reduced-sodium. Steak sauce. Canned and packaged gravies. Fish sauce. Oyster sauce. Cocktail sauce. Horseradish that you find on the shelf. Ketchup. Mustard. Meat flavorings and tenderizers. Bouillon cubes. Hot sauce and Tabasco sauce. Premade or packaged marinades. Premade or packaged taco seasonings. Relishes. Regular salad dressings. Where to find more information:  National Heart, Lung, and Blood Institute: www.nhlbi.nih.gov  American Heart Association: www.heart.org Summary  The DASH eating plan is a healthy eating plan that has been shown to reduce high blood pressure (hypertension). It may also reduce your risk for type 2 diabetes, heart disease, and stroke.  With the DASH eating plan, you should limit salt  (sodium) intake to 2,300 mg a day. If you have hypertension, you may need to reduce your sodium intake to 1,500 mg a day.  When on the DASH eating plan, aim to eat more fresh fruits and vegetables, whole grains, lean proteins, low-fat dairy, and heart-healthy fats.  Work with your health care provider or diet and nutrition specialist (dietitian) to adjust your eating plan to your individual calorie needs. This information is not intended to replace advice given to you by your health care provider. Make sure you discuss any questions you have with your health care provider. Document Revised: 09/26/2017 Document Reviewed: 10/07/2016 Elsevier Patient Education  2020 [employer]  

## 2019-11-17 NOTE — Progress Notes (Signed)
Established Patient Office Visit  Subjective:  Patient ID: Laurie Kirby, female    DOB: December 11, 1960  Age: 59 y.o. MRN: [id number]  CC:  Chief Complaint  Patient presents with  . Diabetes    HPI [name] presents for hypertension and type 2 diabetes.  She states that she is compliant with her medication, but has not been checking her blood pressure or blood glucose since 11/01/2019.  Her hemoglobin A1c was done on 11/11/2019, it decreased from 9.4% to 7.5%, and her blood glucose was checked during visit and it was 128 mg per DL.  She denies hypoglycemic/hyperglycemic symptoms, and states that she performs daily foot checks.  She followed up with [facility] endocrinology Dr Lysbeth Galas on 11/16/2019 and her glipizide was increased to 10 mg twice daily.  Her lipid panel done on 11/11/2019, total cholesterol was 227, triglyceride 171 and LDL 145 mg per DL, and she continues to take 40 mg of atorvastatin.  She continues to smoke 1 pack of cigarettes daily and offers the desire to quit.  She denies any chest pain, palpitation, lightheadedness, myalgia, cough, bruises, hematuria, hematochezia, fever and chills.  She states she is in a dysphoric mood because she lost her brother, and declines any mental health referral. She denies suicidal nor homicidal ideation.  Overall she states that she is doing well and offers no further complaint.  Past Medical History:  Diagnosis Date  . Diabetes mellitus without complication ([place])   . Hyperlipidemia   . Hypertension     Past Surgical History:  Procedure Laterality Date  . CORONARY ANGIOPLASTY WITH STENT PLACEMENT      Family History  Problem Relation Age of Onset  . Diabetes type II Mother   . Diabetes type II Father   . Heart disease Father   . Cancer Maternal Aunt   . Breast cancer Cousin     Social History   Socioeconomic History  . Marital status: Single    Spouse name: Not on file  . Number of children: 3  . Years of education: Not on file   . Highest education level: High school graduate  Occupational History  . Occupation: unemployed  Tobacco Use  . Smoking status: Current Some Day Smoker    Types: Cigarettes    Last attempt to quit: 12/07/2013    Years since quitting: 5.9  . Smokeless tobacco: Never Used  Substance and Sexual Activity  . Alcohol use: Yes    Alcohol/week: 4.0 standard drinks    Types: 4 Glasses of wine per week    Comment:  wine on the weekends  . Drug use: No  . Sexual activity: Yes  Other Topics Concern  . Not on file  Social History Narrative   A friend supports. Not on food stamps. Pretty well taken care of by friend.    Social Determinants of Health   Financial Resource Strain:   . Difficulty of Paying Living Expenses: Not on file  Food Insecurity:   . Worried About Charity fundraiser in the Last Year: Not on file  . Ran Out of Food in the Last Year: Not on file  Transportation Needs:   . Lack of Transportation (Medical): Not on file  . Lack of Transportation (Non-Medical): Not on file  Physical Activity:   . Days of Exercise per Week: Not on file  . Minutes of Exercise per Session: Not on file  Stress:   . Feeling of Stress : Not on file  Social Connections:   .  Frequency of Communication with Friends and Family: Not on file  . Frequency of Social Gatherings with Friends and Family: Not on file  . Attends Religious Services: Not on file  . Active Member of Clubs or Organizations: Not on file  . Attends Banker Meetings: Not on file  . Marital Status: Not on file  Intimate Partner Violence:   . Fear of Current or Ex-Partner: Not on file  . Emotionally Abused: Not on file  . Physically Abused: Not on file  . Sexually Abused: Not on file    Outpatient Medications Prior to Visit  Medication Sig Dispense Refill  . Ascorbic Acid (VITAMIN C) 1000 MG tablet Take 1,000 mg by mouth daily.    Marland Kitchen aspirin EC 81 MG tablet Take 1 tablet (81 mg total) by mouth daily. 90 tablet 3   . atorvastatin (LIPITOR) 40 MG tablet TAKE 1 TABLET (40MG  TOTAL) BY MOUTH DAILY 90 tablet 1  . clopidogrel (PLAVIX) 75 MG tablet Take 1 tablet (75 mg total) by mouth daily. 90 tablet 1  . famotidine (PEPCID) 20 MG tablet Take 1 tablet (20 mg total) by mouth 2 (two) times daily. 30 tablet 6  . glipiZIDE (GLUCOTROL) 10 MG tablet Take 1 tablet (10 mg total) by mouth 2 (two) times daily before a meal. 180 tablet 3  . ibuprofen (ADVIL) 800 MG tablet TAKE 1 TABLET BY MOUTH EVERY 8 HOURS AS NEEDED. 60 tablet 1  . isosorbide mononitrate (IMDUR) 30 MG 24 hr tablet Take 1 tablet (30 mg total) by mouth daily. 90 tablet 1  . lisinopril (ZESTRIL) 40 MG tablet TAKE 1 TABLET BY MOUTH DAILY 30 tablet 3  . metFORMIN (GLUCOPHAGE) 1000 MG tablet Take 1.5 tablets (1,500 mg total) by mouth daily with breakfast. She takes 1000 mg at breakfast and 500 mg at dinner 180 tablet 1  . mometasone (ELOCON) 0.1 % ointment Apply topically daily. 45 g 3  . metoprolol tartrate (LOPRESSOR) 50 MG tablet Take 1 tablet (50 mg total) by mouth 2 (two) times daily. 60 tablet 2  . sitaGLIPtin (JANUVIA) 50 MG tablet Take 0.5 tablets (25 mg total) by mouth daily. (Patient not taking: Reported on 09/13/2019) 30 tablet 0   No facility-administered medications prior to visit.    No Known Allergies  ROS Review of Systems  Constitutional: Negative.   Eyes: Negative.   Respiratory: Negative.   Endocrine: Negative.   Skin: Negative.   Neurological: Negative.   Hematological: Negative.   Psychiatric/Behavioral: Positive for dysphoric mood (Brother died).      Objective:    Physical Exam  Constitutional: She is oriented to person, place, and time. She appears well-developed and well-nourished.  HENT:  Head: Normocephalic and atraumatic.  Eyes: Pupils are equal, round, and reactive to light. EOM are normal.  Cardiovascular: Normal rate and regular rhythm.  Pulmonary/Chest: Effort normal and breath sounds normal.  Neurological:  She is alert and oriented to person, place, and time.  Skin: Skin is warm and dry.  Psychiatric: She has a normal mood and affect. Her behavior is normal. Judgment and thought content normal.    BP (!) 153/95 (BP Location: Left Arm, Patient Position: Sitting)   Pulse 83   Ht 5\' 4"  (1.626 m)   Wt 149 lb (67.6 kg)   SpO2 97%   BMI 25.58 kg/m  Wt Readings from Last 3 Encounters:  11/17/19 149 lb (67.6 kg)  10/12/19 150 lb (68 kg)  09/20/19 160 lb (72.6 kg)  Health Maintenance Due  Topic Date Due  . Hepatitis C Screening  [birth year]  . PNEUMOCOCCAL POLYSACCHARIDE VACCINE AGE 96-64 HIGH RISK  06/10/1964  . HIV Screening  06/10/1977  . TETANUS/TDAP  06/10/1981  . OPHTHALMOLOGY EXAM  05/23/2017  . PAP SMEAR-Modifier  10/10/2018  . MAMMOGRAM  10/29/2019    There are no preventive care reminders to display for this patient.  Lab Results  Component Value Date   TSH 1.590 05/08/2017   Lab Results  Component Value Date   WBC 15.2 (H) 08/04/2019   HGB 15.4 08/04/2019   HCT 45.4 08/04/2019   MCV 90 08/04/2019   PLT 292 08/04/2019   Lab Results  Component Value Date   NA 142 11/11/2019   K 4.1 11/11/2019   CO2 25 11/11/2019   GLUCOSE 140 (H) 11/11/2019   BUN 13 11/11/2019   CREATININE 0.61 11/11/2019   BILITOT <0.2 11/11/2019   ALKPHOS 66 11/11/2019   AST 12 11/11/2019   ALT 12 11/11/2019   PROT 7.3 11/11/2019   ALBUMIN 4.2 11/11/2019   CALCIUM 9.8 11/11/2019   ANIONGAP 7 10/02/2017   Lab Results  Component Value Date   CHOL 227 (H) 11/11/2019   Lab Results  Component Value Date   HDL 51 11/11/2019   Lab Results  Component Value Date   LDLCALC 145 (H) 11/11/2019   Lab Results  Component Value Date   TRIG 171 (H) 11/11/2019   Lab Results  Component Value Date   CHOLHDL 4.5 (H) 11/11/2019   Lab Results  Component Value Date   HGBA1C 7.5 (H) 11/11/2019      Assessment & Plan:    1. Essential hypertension -Her blood pressure is improving, but  her goal should be less than 140/90.  She was advised to continue on her medication regimen, check her blood pressure daily, record and bring the log to follow-up appointment.  She was advised to continue on DASH diet and exercise as tolerated.  2. Diabetes mellitus without complication ([facility]) -Her hemoglobin A1c improved at 7.5% and her goal should be less than 7%.  She promised that she will start checking her blood glucose at least daily after dealing with her brother's death.  She was encouraged to check her blood glucose, record and bring the log to follow-up appointment meant.  She was advised to continue on low-carb/non concentrated sweet diet, and exercise as tolerated. - HgB A1c; Future  3. Hyperlipidemia, unspecified hyperlipidemia type -Her ASCVD 10-year risk was 44.7%, she declines for her atorvastatin 40 mg to be increased to 80.  She promised to work on her diet.  She was advised to continue on low-fat/low-cholesterol diet and exercise as tolerated.   - Lipid panel; Future    Follow-up: Return in about 13 weeks (around 02/16/2020), or if symptoms worsen or fail to improve.    Delonda Coley Trellis Paganini, NP

## 2019-12-17 ENCOUNTER — Other Ambulatory Visit: Payer: Self-pay | Admitting: Urology

## 2019-12-17 DIAGNOSIS — E119 Type 2 diabetes mellitus without complications: Secondary | ICD-10-CM

## 2020-01-12 LAB — HM DIABETES EYE EXAM

## 2020-01-13 ENCOUNTER — Other Ambulatory Visit: Payer: Self-pay

## 2020-02-10 ENCOUNTER — Other Ambulatory Visit: Payer: Self-pay

## 2020-02-16 ENCOUNTER — Ambulatory Visit: Payer: Self-pay | Admitting: Gerontology

## 2020-02-24 ENCOUNTER — Other Ambulatory Visit: Payer: Self-pay

## 2020-02-24 ENCOUNTER — Encounter: Payer: Self-pay | Admitting: Gerontology

## 2020-02-24 ENCOUNTER — Ambulatory Visit: Payer: Self-pay | Admitting: Gerontology

## 2020-02-24 VITALS — BP 187/87 | HR 80 | Wt 149.8 lb

## 2020-02-24 DIAGNOSIS — E119 Type 2 diabetes mellitus without complications: Secondary | ICD-10-CM

## 2020-02-24 DIAGNOSIS — M25551 Pain in right hip: Secondary | ICD-10-CM

## 2020-02-24 DIAGNOSIS — I10 Essential (primary) hypertension: Secondary | ICD-10-CM

## 2020-02-24 NOTE — Patient Instructions (Signed)
DASH Eating Plan DASH stands for "Dietary Approaches to Stop Hypertension." The DASH eating plan is a healthy eating plan that has been shown to reduce high blood pressure (hypertension). It may also reduce your risk for type 2 diabetes, heart disease, and stroke. The DASH eating plan may also help with weight loss. What are tips for following this plan?  General guidelines  Avoid eating more than 2,300 mg (milligrams) of salt (sodium) a day. If you have hypertension, you may need to reduce your sodium intake to 1,500 mg a day.  Limit alcohol intake to no more than 1 drink a day for nonpregnant women and 2 drinks a day for men. One drink equals 12 oz of beer, 5 oz of wine, or 1 oz of hard liquor.  Work with your health care provider to maintain a healthy body weight or to lose weight. Ask what an ideal weight is for you.  Get at least 30 minutes of exercise that causes your heart to beat faster (aerobic exercise) most days of the week. Activities may include walking, swimming, or biking.  Work with your health care provider or diet and nutrition specialist (dietitian) to adjust your eating plan to your individual calorie needs. Reading food labels   Check food labels for the amount of sodium per serving. Choose foods with less than 5 percent of the Daily Value of sodium. Generally, foods with less than 300 mg of sodium per serving fit into this eating plan.  To find whole grains, look for the word "whole" as the first word in the ingredient list. Shopping  Buy products labeled as "low-sodium" or "no salt added."  Buy fresh foods. Avoid canned foods and premade or frozen meals. Cooking  Avoid adding salt when cooking. Use salt-free seasonings or herbs instead of table salt or sea salt. Check with your health care provider or pharmacist before using salt substitutes.  Do not fry foods. Cook foods using healthy methods such as baking, boiling, grilling, and broiling instead.  Cook with  heart-healthy oils, such as olive, canola, soybean, or sunflower oil. Meal planning  Eat a balanced diet that includes: ? 5 or more servings of fruits and vegetables each day. At each meal, try to fill half of your plate with fruits and vegetables. ? Up to 6-8 servings of whole grains each day. ? Less than 6 oz of lean meat, poultry, or fish each day. A 3-oz serving of meat is about the same size as a deck of cards. One egg equals 1 oz. ? 2 servings of low-fat dairy each day. ? A serving of nuts, seeds, or beans 5 times each week. ? Heart-healthy fats. Healthy fats called Omega-3 fatty acids are found in foods such as flaxseeds and coldwater fish, like sardines, salmon, and mackerel.  Limit how much you eat of the following: ? Canned or prepackaged foods. ? Food that is high in trans fat, such as fried foods. ? Food that is high in saturated fat, such as fatty meat. ? Sweets, desserts, sugary drinks, and other foods with added sugar. ? Full-fat dairy products.  Do not salt foods before eating.  Try to eat at least 2 vegetarian meals each week.  Eat more home-cooked food and less restaurant, buffet, and fast food.  When eating at a restaurant, ask that your food be prepared with less salt or no salt, if possible. What foods are recommended? The items listed may not be a complete list. Talk with your dietitian about   what dietary choices are best for you. Grains Whole-grain or whole-wheat bread. Whole-grain or whole-wheat pasta. Brown rice. Oatmeal. Quinoa. Bulgur. Whole-grain and low-sodium cereals. Pita bread. Low-fat, low-sodium crackers. Whole-wheat flour tortillas. Vegetables Fresh or frozen vegetables (raw, steamed, roasted, or grilled). Low-sodium or reduced-sodium tomato and vegetable juice. Low-sodium or reduced-sodium tomato sauce and tomato paste. Low-sodium or reduced-sodium canned vegetables. Fruits All fresh, dried, or frozen fruit. Canned fruit in natural juice (without  added sugar). Meat and other protein foods Skinless chicken or turkey. Ground chicken or turkey. Pork with fat trimmed off. Fish and seafood. Egg whites. Dried beans, peas, or lentils. Unsalted nuts, nut butters, and seeds. Unsalted canned beans. Lean cuts of beef with fat trimmed off. Low-sodium, lean deli meat. Dairy Low-fat (1%) or fat-free (skim) milk. Fat-free, low-fat, or reduced-fat cheeses. Nonfat, low-sodium ricotta or cottage cheese. Low-fat or nonfat yogurt. Low-fat, low-sodium cheese. Fats and oils Soft margarine without trans fats. Vegetable oil. Low-fat, reduced-fat, or light mayonnaise and salad dressings (reduced-sodium). Canola, safflower, olive, soybean, and sunflower oils. Avocado. Seasoning and other foods Herbs. Spices. Seasoning mixes without salt. Unsalted popcorn and pretzels. Fat-free sweets. What foods are not recommended? The items listed may not be a complete list. Talk with your dietitian about what dietary choices are best for you. Grains Baked goods made with fat, such as croissants, muffins, or some breads. Dry pasta or rice meal packs. Vegetables Creamed or fried vegetables. Vegetables in a cheese sauce. Regular canned vegetables (not low-sodium or reduced-sodium). Regular canned tomato sauce and paste (not low-sodium or reduced-sodium). Regular tomato and vegetable juice (not low-sodium or reduced-sodium). Pickles. Olives. Fruits Canned fruit in a light or heavy syrup. Fried fruit. Fruit in cream or butter sauce. Meat and other protein foods Fatty cuts of meat. Ribs. Fried meat. Bacon. Sausage. Bologna and other processed lunch meats. Salami. Fatback. Hotdogs. Bratwurst. Salted nuts and seeds. Canned beans with added salt. Canned or smoked fish. Whole eggs or egg yolks. Chicken or turkey with skin. Dairy Whole or 2% milk, cream, and half-and-half. Whole or full-fat cream cheese. Whole-fat or sweetened yogurt. Full-fat cheese. Nondairy creamers. Whipped toppings.  Processed cheese and cheese spreads. Fats and oils Butter. Stick margarine. Lard. Shortening. Ghee. Bacon fat. Tropical oils, such as coconut, palm kernel, or palm oil. Seasoning and other foods Salted popcorn and pretzels. Onion salt, garlic salt, seasoned salt, table salt, and sea salt. Worcestershire sauce. Tartar sauce. Barbecue sauce. Teriyaki sauce. Soy sauce, including reduced-sodium. Steak sauce. Canned and packaged gravies. Fish sauce. Oyster sauce. Cocktail sauce. Horseradish that you find on the shelf. Ketchup. Mustard. Meat flavorings and tenderizers. Bouillon cubes. Hot sauce and Tabasco sauce. Premade or packaged marinades. Premade or packaged taco seasonings. Relishes. Regular salad dressings. Where to find more information:  National Heart, Lung, and Blood Institute: www.nhlbi.nih.gov  American Heart Association: www.heart.org Summary  The DASH eating plan is a healthy eating plan that has been shown to reduce high blood pressure (hypertension). It may also reduce your risk for type 2 diabetes, heart disease, and stroke.  With the DASH eating plan, you should limit salt (sodium) intake to 2,300 mg a day. If you have hypertension, you may need to reduce your sodium intake to 1,500 mg a day.  When on the DASH eating plan, aim to eat more fresh fruits and vegetables, whole grains, lean proteins, low-fat dairy, and heart-healthy fats.  Work with your health care provider or diet and nutrition specialist (dietitian) to adjust your eating plan to your   individual calorie needs. This information is not intended to replace advice given to you by your health care provider. Make sure you discuss any questions you have with your health care provider. Document Revised: 09/26/2017 Document Reviewed: 10/07/2016 Elsevier Patient Education  2020 [employer]  

## 2020-02-24 NOTE — Progress Notes (Signed)
Established Patient Office Visit  Subjective:  Patient ID: Laurie Kirby, female    DOB: January 02, 1961  Age: 59 y.o. MRN: [id number]  CC: No chief complaint on file.   HPI Laurie Kirby presents for follow up of hypertension, type 2 DM , and right hip pain. Her blood pressure was elevated during visit and she states that she has not taking her medication prior to clinic visit. She's also not taking her medication as ordered, she takes 50 mg Metoprolol in the evening instead of bid. She missed her lab appointment, her HgbA1c done on 11/11/2019 was 7.5% and she states that she takes Metformin once daily instead of twice, and can't afford Januvia. She denies hypoglycemic symptoms and peripheral neuropathy. Currently, she states that she's experiencing intermittent right hip pain that has been going on for 2 years. She states that her last pain episode was 3 weeks ago, and it's flares up during the cold weather. She denies motor weakness and neuropathy. Overall, she states that she's doing well and offers no further complaint.  Past Medical History:  Diagnosis Date  . Diabetes mellitus without complication (HCC)   . Hyperlipidemia   . Hypertension     Past Surgical History:  Procedure Laterality Date  . CORONARY ANGIOPLASTY WITH STENT PLACEMENT      Family History  Problem Relation Age of Onset  . Diabetes type II Mother   . Diabetes type II Father   . Heart disease Father   . Cancer Maternal Aunt   . Breast cancer Cousin     Social History   Socioeconomic History  . Marital status: Single    Spouse name: Not on file  . Number of children: 3  . Years of education: Not on file  . Highest education level: High school graduate  Occupational History  . Occupation: unemployed  Tobacco Use  . Smoking status: Current Some Day Smoker    Types: Cigarettes    Last attempt to quit: 12/07/2013    Years since quitting: 6.2  . Smokeless tobacco: Never Used  Substance and Sexual Activity   . Alcohol use: Yes    Alcohol/week: 4.0 standard drinks    Types: 4 Glasses of wine per week    Comment:  wine on the weekends  . Drug use: No  . Sexual activity: Yes  Other Topics Concern  . Not on file  Social History Narrative   A friend supports. Not on food stamps. Pretty well taken care of by friend.    Social Determinants of Health   Financial Resource Strain:   . Difficulty of Paying Living Expenses:   Food Insecurity:   . Worried About Programme researcher, broadcasting/film/video in the Last Year:   . Barista in the Last Year:   Transportation Needs:   . Freight forwarder (Medical):   Marland Kitchen Lack of Transportation (Non-Medical):   Physical Activity:   . Days of Exercise per Week:   . Minutes of Exercise per Session:   Stress:   . Feeling of Stress :   Social Connections:   . Frequency of Communication with Friends and Family:   . Frequency of Social Gatherings with Friends and Family:   . Attends Religious Services:   . Active Member of Clubs or Organizations:   . Attends Banker Meetings:   Marland Kitchen Marital Status:   Intimate Partner Violence:   . Fear of Current or Ex-Partner:   . Emotionally Abused:   Marland Kitchen Physically  Abused:   . Sexually Abused:     Outpatient Medications Prior to Visit  Medication Sig Dispense Refill  . Ascorbic Acid (VITAMIN C) 1000 MG tablet Take 1,000 mg by mouth daily.    Marland Kitchen aspirin EC 81 MG tablet Take 1 tablet (81 mg total) by mouth daily. 90 tablet 3  . atorvastatin (LIPITOR) 40 MG tablet TAKE 1 TABLET BY MOUTH DAILY 90 tablet 1  . clopidogrel (PLAVIX) 75 MG tablet Take 1 tablet (75 mg total) by mouth daily. 90 tablet 1  . famotidine (PEPCID) 20 MG tablet Take 1 tablet (20 mg total) by mouth 2 (two) times daily. 30 tablet 6  . isosorbide mononitrate (IMDUR) 30 MG 24 hr tablet Take 1 tablet (30 mg total) by mouth daily. 90 tablet 1  . lisinopril (ZESTRIL) 40 MG tablet TAKE 1 TABLET BY MOUTH DAILY 30 tablet 3  . metFORMIN (GLUCOPHAGE) 1000 MG  tablet Take 1.5 tablets (1,500 mg total) by mouth daily with breakfast. She takes 1000 mg at breakfast and 500 mg at dinner 180 tablet 1  . metoprolol tartrate (LOPRESSOR) 50 MG tablet TAKE 1 TABLET BY MOUTH TWICE A DAY 60 tablet 2  . mometasone (ELOCON) 0.1 % ointment Apply topically daily. 45 g 3  . glipiZIDE (GLUCOTROL) 10 MG tablet Take 1 tablet (10 mg total) by mouth 2 (two) times daily before a meal. 180 tablet 3  . ibuprofen (ADVIL) 800 MG tablet TAKE 1 TABLET BY MOUTH EVERY 8 HOURS AS NEEDED. (Patient not taking: Reported on 02/24/2020) 60 tablet 1  . sitaGLIPtin (JANUVIA) 50 MG tablet Take 0.5 tablets (25 mg total) by mouth daily. (Patient not taking: Reported on 09/13/2019) 30 tablet 0   No facility-administered medications prior to visit.    No Known Allergies  ROS Review of Systems  Constitutional: Negative.   Eyes: Negative.  Negative for visual disturbance.  Respiratory: Negative.   Cardiovascular: Negative.   Endocrine: Negative.   Musculoskeletal: Positive for arthralgias (Chronic intermittent right hip pain).  Neurological: Negative.   Psychiatric/Behavioral: Negative.       Objective:    Physical Exam  Constitutional: She is oriented to person, place, and time. She appears well-developed.  HENT:  Head: Normocephalic and atraumatic.  Eyes: Pupils are equal, round, and reactive to light. EOM are normal.  Cardiovascular: Normal rate and regular rhythm.  Pulmonary/Chest: Effort normal and breath sounds normal.  Musculoskeletal:        General: Normal range of motion.  Neurological: She is alert and oriented to person, place, and time.  Psychiatric: She has a normal mood and affect. Her behavior is normal. Judgment and thought content normal.    BP (!) 187/87   Pulse 80   Wt 149 lb 12.8 oz (67.9 kg)   SpO2 98%   BMI 25.71 kg/m  Wt Readings from Last 3 Encounters:  02/24/20 149 lb 12.8 oz (67.9 kg)  11/17/19 149 lb (67.6 kg)  10/12/19 150 lb (68 kg)      Health Maintenance Due  Topic Date Due  . Hepatitis C Screening  Never done  . HIV Screening  Never done  . COVID-19 Vaccine (1) Never done  . TETANUS/TDAP  Never done  . PAP SMEAR-Modifier  10/10/2018  . MAMMOGRAM  10/29/2019    There are no preventive care reminders to display for this patient.  Lab Results  Component Value Date   TSH 1.590 05/08/2017   Lab Results  Component Value Date   WBC 15.2 (  H) 08/04/2019   HGB 15.4 08/04/2019   HCT 45.4 08/04/2019   MCV 90 08/04/2019   PLT 292 08/04/2019   Lab Results  Component Value Date   NA 142 11/11/2019   K 4.1 11/11/2019   CO2 25 11/11/2019   GLUCOSE 140 (H) 11/11/2019   BUN 13 11/11/2019   CREATININE 0.61 11/11/2019   BILITOT <0.2 11/11/2019   ALKPHOS 66 11/11/2019   AST 12 11/11/2019   ALT 12 11/11/2019   PROT 7.3 11/11/2019   ALBUMIN 4.2 11/11/2019   CALCIUM 9.8 11/11/2019   ANIONGAP 7 10/02/2017   Lab Results  Component Value Date   CHOL 227 (H) 11/11/2019   Lab Results  Component Value Date   HDL 51 11/11/2019   Lab Results  Component Value Date   LDLCALC 145 (H) 11/11/2019   Lab Results  Component Value Date   TRIG 171 (H) 11/11/2019   Lab Results  Component Value Date   CHOLHDL 4.5 (H) 11/11/2019   Lab Results  Component Value Date   HGBA1C 7.5 (H) 11/11/2019      Assessment & Plan:   1. Essential hypertension - Her blood pressure is not controlled secondary to non compliance with medication regimen. She was educated on the complications of uncontrolled hypertension, advised to take medication as ordered. -Low salt DASH diet -Take medications regularly on time -Exercise regularly as tolerated -Check blood pressure at least once a week at home or a nearby pharmacy and record -Goal is less than 140/90 and normal blood pressure is 120/80    2. Diabetes mellitus without complication (HCC) - Her HgbA1c was 7.5%, her goal is less than 7%. -Use Diabetic diet as advised  -Check  blood sugar 2 times a day, once before breakfast and others 2 hours after lunch or dinner -Write down the numbers against date in a log -Bring log to clinic every visit -Take medications regularly as advised -Regular exercise - She will follow up with [facility].   3. Right hip pain - She was advised to take otc analgesics as needed, and will follow up with [facility] Surgeon Dr Justice Rocher.     Follow-up: Return in about 3 weeks (around 03/16/2020), or if symptoms worsen or fail to improve.    Nashaun Hillmer Trellis Paganini, NP

## 2020-03-08 ENCOUNTER — Other Ambulatory Visit: Payer: Self-pay

## 2020-03-08 DIAGNOSIS — E119 Type 2 diabetes mellitus without complications: Secondary | ICD-10-CM

## 2020-03-08 DIAGNOSIS — E785 Hyperlipidemia, unspecified: Secondary | ICD-10-CM

## 2020-03-09 LAB — LIPID PANEL
Chol/HDL Ratio: 5.2 ratio — ABNORMAL HIGH (ref 0.0–4.4)
Cholesterol, Total: 206 mg/dL — ABNORMAL HIGH (ref 100–199)
HDL: 40 mg/dL (ref >39)
LDL Chol Calc (NIH): 146 mg/dL — ABNORMAL HIGH (ref 0–99)
Triglycerides: 108 mg/dL (ref 0–149)
VLDL Cholesterol Cal: 20 mg/dL (ref 5–40)

## 2020-03-09 LAB — HEMOGLOBIN A1C
Est. average glucose Bld gHb Est-mCnc: 214 mg/dL
Hgb A1c MFr Bld: 9.1 % — ABNORMAL HIGH (ref 4.8–5.6)

## 2020-03-16 ENCOUNTER — Other Ambulatory Visit: Payer: Self-pay | Admitting: Gerontology

## 2020-03-16 ENCOUNTER — Other Ambulatory Visit: Payer: Self-pay | Admitting: Urology

## 2020-03-16 ENCOUNTER — Ambulatory Visit: Payer: Self-pay | Admitting: Gerontology

## 2020-03-16 ENCOUNTER — Other Ambulatory Visit: Payer: Self-pay

## 2020-03-16 VITALS — BP 160/94 | HR 98 | Temp 98.1°F | Resp 16 | Ht 63.0 in | Wt 147.7 lb

## 2020-03-16 DIAGNOSIS — I10 Essential (primary) hypertension: Secondary | ICD-10-CM

## 2020-03-16 DIAGNOSIS — E785 Hyperlipidemia, unspecified: Secondary | ICD-10-CM

## 2020-03-16 DIAGNOSIS — E119 Type 2 diabetes mellitus without complications: Secondary | ICD-10-CM

## 2020-03-16 DIAGNOSIS — I25119 Atherosclerotic heart disease of native coronary artery with unspecified angina pectoris: Secondary | ICD-10-CM

## 2020-03-16 DIAGNOSIS — Z0000 Encounter for general adult medical examination without abnormal findings: Secondary | ICD-10-CM

## 2020-03-16 DIAGNOSIS — M25551 Pain in right hip: Secondary | ICD-10-CM

## 2020-03-16 MED ORDER — MOMETASONE FUROATE 0.1 % EX OINT: TOPICAL_OINTMENT | Freq: Every day | CUTANEOUS | 0 refills | Status: DC

## 2020-03-16 MED ORDER — ISOSORBIDE MONONITRATE ER 30 MG PO TB24: 30.0000 mg | ORAL_TABLET | Freq: Every day | ORAL | 1 refills | Status: DC

## 2020-03-16 MED ORDER — FAMOTIDINE 20 MG PO TABS: 20.0000 mg | ORAL_TABLET | Freq: Two times a day (BID) | ORAL | 6 refills | Status: DC

## 2020-03-16 MED ORDER — GLIPIZIDE 10 MG PO TABS: 10.0000 mg | ORAL_TABLET | Freq: Two times a day (BID) | ORAL | 3 refills | Status: DC

## 2020-03-16 MED ORDER — CLOPIDOGREL BISULFATE 75 MG PO TABS: 75.0000 mg | ORAL_TABLET | Freq: Every day | ORAL | 1 refills | Status: DC

## 2020-03-16 MED ORDER — METOPROLOL TARTRATE 50 MG PO TABS: 50.0000 mg | ORAL_TABLET | Freq: Two times a day (BID) | ORAL | 2 refills | Status: DC

## 2020-03-16 MED ORDER — IBUPROFEN 800 MG PO TABS: 800.0000 mg | ORAL_TABLET | Freq: Three times a day (TID) | ORAL | 0 refills | Status: DC | PRN

## 2020-03-16 NOTE — Patient Instructions (Signed)
Fat and Cholesterol Restricted Eating Plan °Getting too much fat and cholesterol in your diet may cause health problems. Choosing the right foods helps keep your fat and cholesterol at normal levels. This can keep you from getting certain diseases. °Your doctor may recommend an eating plan that includes: °· Total fat: ______% or less of total calories a day. °· Saturated fat: ______% or less of total calories a day. °· Cholesterol: less than _________mg a day. °· Fiber: ______g a day. °What are tips for following this plan? °Meal planning °· At meals, divide your plate into four equal parts: °? Fill one-half of your plate with vegetables and green salads. °? Fill one-fourth of your plate with whole grains. °? Fill one-fourth of your plate with low-fat (lean) protein foods. °· Eat fish that is high in omega-3 fats at least two times a week. This includes mackerel, tuna, sardines, and salmon. °· Eat foods that are high in fiber, such as whole grains, beans, apples, broccoli, carrots, peas, and barley. °General tips ° °· Work with your doctor to lose weight if you need to. °· Avoid: °? Foods with added sugar. °? Fried foods. °? Foods with partially hydrogenated oils. °· Limit alcohol intake to no more than 1 drink a day for nonpregnant women and 2 drinks a day for men. One drink equals 12 oz of beer, 5 oz of wine, or 1½ oz of hard liquor. °Reading food labels °· Check food labels for: °? Trans fats. °? Partially hydrogenated oils. °? Saturated fat (g) in each serving. °? Cholesterol (mg) in each serving. °? Fiber (g) in each serving. °· Choose foods with healthy fats, such as: °? Monounsaturated fats. °? Polyunsaturated fats. °? Omega-3 fats. °· Choose grain products that have whole grains. Look for the word "whole" as the first word in the ingredient list. °Cooking °· Cook foods using low-fat methods. These include baking, boiling, grilling, and broiling. °· Eat more home-cooked foods. Eat at restaurants and buffets  less often. °· Avoid cooking using saturated fats, such as butter, cream, palm oil, palm kernel oil, and coconut oil. °Recommended foods ° °Fruits °· All fresh, canned (in natural juice), or frozen fruits. °Vegetables °· Fresh or frozen vegetables (raw, steamed, roasted, or grilled). Green salads. °Grains °· Whole grains, such as whole wheat or whole grain breads, crackers, cereals, and pasta. Unsweetened oatmeal, bulgur, barley, quinoa, or brown rice. Corn or whole wheat flour tortillas. °Meats and other protein foods °· Ground beef (85% or leaner), grass-fed beef, or beef trimmed of fat. Skinless chicken or turkey. Ground chicken or turkey. Pork trimmed of fat. All fish and seafood. Egg whites. Dried beans, peas, or lentils. Unsalted nuts or seeds. Unsalted canned beans. Nut butters without added sugar or oil. °Dairy °· Low-fat or nonfat dairy products, such as skim or 1% milk, 2% or reduced-fat cheeses, low-fat and fat-free ricotta or cottage cheese, or plain low-fat and nonfat yogurt. °Fats and oils °· Tub margarine without trans fats. Light or reduced-fat mayonnaise and salad dressings. Avocado. Olive, canola, sesame, or safflower oils. °The items listed above may not be a complete list of foods and beverages you can eat. Contact a dietitian for more information. °Foods to avoid °Fruits °· Canned fruit in heavy syrup. Fruit in cream or butter sauce. Fried fruit. °Vegetables °· Vegetables cooked in cheese, cream, or butter sauce. Fried vegetables. °Grains °· Edmonds bread. Lansky pasta. Shroff rice. Cornbread. Bagels, pastries, and croissants. Crackers and snack foods that contain trans fat   and hydrogenated oils. °Meats and other protein foods °· Fatty cuts of meat. Ribs, chicken wings, bacon, sausage, bologna, salami, chitterlings, fatback, hot dogs, bratwurst, and packaged lunch meats. Liver and organ meats. Whole eggs and egg yolks. Chicken and turkey with skin. Fried meat. °Dairy °· Whole or 2% milk, cream,  half-and-half, and cream cheese. Whole milk cheeses. Whole-fat or sweetened yogurt. Full-fat cheeses. Nondairy creamers and whipped toppings. Processed cheese, cheese spreads, and cheese curds. °Beverages °· Alcohol. Sugar-sweetened drinks such as sodas, lemonade, and fruit drinks. °Fats and oils °· Butter, stick margarine, lard, shortening, ghee, or bacon fat. Coconut, palm kernel, and palm oils. °Sweets and desserts °· Corn syrup, sugars, honey, and molasses. Candy. Jam and jelly. Syrup. Sweetened cereals. Cookies, pies, cakes, donuts, muffins, and ice cream. °The items listed above may not be a complete list of foods and beverages you should avoid. Contact a dietitian for more information. °Summary °· Choosing the right foods helps keep your fat and cholesterol at normal levels. This can keep you from getting certain diseases. °· At meals, fill one-half of your plate with vegetables and green salads. °· Eat high-fiber foods, like whole grains, beans, apples, carrots, peas, and barley. °· Limit added sugar, saturated fats, alcohol, and fried foods. °This information is not intended to replace advice given to you by your health care provider. Make sure you discuss any questions you have with your health care provider. °Document Revised: 06/17/2018 Document Reviewed: 07/01/2017 °Elsevier Patient Education © 2020 [employer] °Carbohydrate Counting for Diabetes Mellitus, Adult ° °Carbohydrate counting is a method of keeping track of how many carbohydrates you eat. Eating carbohydrates naturally increases the amount of sugar (glucose) in the blood. Counting how many carbohydrates you eat helps keep your blood glucose within normal limits, which helps you manage your diabetes (diabetes mellitus). °It is important to know how many carbohydrates you can safely have in each meal. This is different for every person. A diet and nutrition specialist (registered dietitian) can help you make a meal plan and calculate how many  carbohydrates you should have at each meal and snack. °Carbohydrates are found in the following foods: °· Grains, such as breads and cereals. °· Dried beans and soy products. °· Starchy vegetables, such as potatoes, peas, and corn. °· Fruit and fruit juices. °· Milk and yogurt. °· Sweets and snack foods, such as cake, cookies, candy, chips, and soft drinks. °How do I count carbohydrates? °There are two ways to count carbohydrates in food. You can use either of the methods or a combination of both. °Reading "Nutrition Facts" on packaged food °The "Nutrition Facts" list is included on the labels of almost all packaged foods and beverages in the [place] It includes: °· The serving size. °· Information about nutrients in each serving, including the grams (g) of carbohydrate per serving. °To use the “Nutrition Facts": °· Decide how many servings you will have. °· Multiply the number of servings by the number of carbohydrates per serving. °· The resulting number is the total amount of carbohydrates that you will be having. °Learning standard serving sizes of other foods °When you eat carbohydrate foods that are not packaged or do not include "Nutrition Facts" on the label, you need to measure the servings in order to count the amount of carbohydrates: °· Measure the foods that you will eat with a food scale or measuring cup, if needed. °· Decide how many standard-size servings you will eat. °· Multiply the number of servings by 15.   Most carbohydrate-rich foods have about 15 g of carbohydrates per serving. °? For example, if you eat 8 oz (170 g) of strawberries, you will have eaten 2 servings and 30 g of carbohydrates (2 servings x 15 g = 30 g). °· For foods that have more than one food mixed, such as soups and casseroles, you must count the carbohydrates in each food that is included. °The following list contains standard serving sizes of common carbohydrate-rich foods. Each of these servings has about 15 g of  carbohydrates: °· ½ hamburger bun or ½ English muffin. °· ½ oz (15 mL) syrup. °· ½ oz (14 g) jelly. °· 1 slice of bread. °· 1 six-inch tortilla. °· 3 oz (85 g) cooked rice or pasta. °· 4 oz (113 g) cooked dried beans. °· 4 oz (113 g) starchy vegetable, such as peas, corn, or potatoes. °· 4 oz (113 g) hot cereal. °· 4 oz (113 g) mashed potatoes or ¼ of a large baked potato. °· 4 oz (113 g) canned or frozen fruit. °· 4 oz (120 mL) fruit juice. °· 4-6 crackers. °· 6 chicken nuggets. °· 6 oz (170 g) unsweetened dry cereal. °· 6 oz (170 g) plain fat-free yogurt or yogurt sweetened with artificial sweeteners. °· 8 oz (240 mL) milk. °· 8 oz (170 g) fresh fruit or one small piece of fruit. °· 24 oz (680 g) popped popcorn. °Example of carbohydrate counting °Sample meal °· 3 oz (85 g) chicken breast. °· 6 oz (170 g) brown rice. °· 4 oz (113 g) corn. °· 8 oz (240 mL) milk. °· 8 oz (170 g) strawberries with sugar-free whipped topping. °Carbohydrate calculation °1. Identify the foods that contain carbohydrates: °? Rice. °? Corn. °? Milk. °? Strawberries. °2. Calculate how many servings you have of each food: °? 2 servings rice. °? 1 serving corn. °? 1 serving milk. °? 1 serving strawberries. °3. Multiply each number of servings by 15 g: °? 2 servings rice x 15 g = 30 g. °? 1 serving corn x 15 g = 15 g. °? 1 serving milk x 15 g = 15 g. °? 1 serving strawberries x 15 g = 15 g. °4. Add together all of the amounts to find the total grams of carbohydrates eaten: °? 30 g + 15 g + 15 g + 15 g = 75 g of carbohydrates total. °Summary °· Carbohydrate counting is a method of keeping track of how many carbohydrates you eat. °· Eating carbohydrates naturally increases the amount of sugar (glucose) in the blood. °· Counting how many carbohydrates you eat helps keep your blood glucose within normal limits, which helps you manage your diabetes. °· A diet and nutrition specialist (registered dietitian) can help you make a meal plan and calculate  how many carbohydrates you should have at each meal and snack. °This information is not intended to replace advice given to you by your health care provider. Make sure you discuss any questions you have with your health care provider. °Document Revised: 05/08/2017 Document Reviewed: 03/27/2016 °Elsevier Patient Education © 2020 [employer] °DASH Eating Plan °DASH stands for "Dietary Approaches to Stop Hypertension." The DASH eating plan is a healthy eating plan that has been shown to reduce high blood pressure (hypertension). It may also reduce your risk for type 2 diabetes, heart disease, and stroke. The DASH eating plan may also help with weight loss. °What are tips for following this plan? ° °General guidelines °· Avoid eating more than 2,300 mg (  milligrams) of salt (sodium) a day. If you have hypertension, you may need to reduce your sodium intake to 1,500 mg a day. °· Limit alcohol intake to no more than 1 drink a day for nonpregnant women and 2 drinks a day for men. One drink equals 12 oz of beer, 5 oz of wine, or 1½ oz of hard liquor. °· Work with your health care provider to maintain a healthy body weight or to lose weight. Ask what an ideal weight is for you. °· Get at least 30 minutes of exercise that causes your heart to beat faster (aerobic exercise) most days of the week. Activities may include walking, swimming, or biking. °· Work with your health care provider or diet and nutrition specialist (dietitian) to adjust your eating plan to your individual calorie needs. °Reading food labels ° °· Check food labels for the amount of sodium per serving. Choose foods with less than 5 percent of the Daily Value of sodium. Generally, foods with less than 300 mg of sodium per serving fit into this eating plan. °· To find whole grains, look for the word "whole" as the first word in the ingredient list. °Shopping °· Buy products labeled as "low-sodium" or "no salt added." °· Buy fresh foods. Avoid canned foods and  premade or frozen meals. °Cooking °· Avoid adding salt when cooking. Use salt-free seasonings or herbs instead of table salt or sea salt. Check with your health care provider or pharmacist before using salt substitutes. °· Do not fry foods. Cook foods using healthy methods such as baking, boiling, grilling, and broiling instead. °· Cook with heart-healthy oils, such as olive, canola, soybean, or sunflower oil. °Meal planning °· Eat a balanced diet that includes: °? 5 or more servings of fruits and vegetables each day. At each meal, try to fill half of your plate with fruits and vegetables. °? Up to 6-8 servings of whole grains each day. °? Less than 6 oz of lean meat, poultry, or fish each day. A 3-oz serving of meat is about the same size as a deck of cards. One egg equals 1 oz. °? 2 servings of low-fat dairy each day. °? A serving of nuts, seeds, or beans 5 times each week. °? Heart-healthy fats. Healthy fats called Omega-3 fatty acids are found in foods such as flaxseeds and coldwater fish, like sardines, salmon, and mackerel. °· Limit how much you eat of the following: °? Canned or prepackaged foods. °? Food that is high in trans fat, such as fried foods. °? Food that is high in saturated fat, such as fatty meat. °? Sweets, desserts, sugary drinks, and other foods with added sugar. °? Full-fat dairy products. °· Do not salt foods before eating. °· Try to eat at least 2 vegetarian meals each week. °· Eat more home-cooked food and less restaurant, buffet, and fast food. °· When eating at a restaurant, ask that your food be prepared with less salt or no salt, if possible. °What foods are recommended? °The items listed may not be a complete list. Talk with your dietitian about what dietary choices are best for you. °Grains °Whole-grain or whole-wheat bread. Whole-grain or whole-wheat pasta. Brown rice. Oatmeal. Quinoa. Bulgur. Whole-grain and low-sodium cereals. Pita bread. Low-fat, low-sodium crackers. Whole-wheat  flour tortillas. °Vegetables °Fresh or frozen vegetables (raw, steamed, roasted, or grilled). Low-sodium or reduced-sodium tomato and vegetable juice. Low-sodium or reduced-sodium tomato sauce and tomato paste. Low-sodium or reduced-sodium canned vegetables. °Fruits °All fresh, dried, or frozen fruit.   Canned fruit in natural juice (without added sugar). °Meat and other protein foods °Skinless chicken or turkey. Ground chicken or turkey. Pork with fat trimmed off. Fish and seafood. Egg whites. Dried beans, peas, or lentils. Unsalted nuts, nut butters, and seeds. Unsalted canned beans. Lean cuts of beef with fat trimmed off. Low-sodium, lean deli meat. °Dairy °Low-fat (1%) or fat-free (skim) milk. Fat-free, low-fat, or reduced-fat cheeses. Nonfat, low-sodium ricotta or cottage cheese. Low-fat or nonfat yogurt. Low-fat, low-sodium cheese. °Fats and oils °Soft margarine without trans fats. Vegetable oil. Low-fat, reduced-fat, or light mayonnaise and salad dressings (reduced-sodium). Canola, safflower, olive, soybean, and sunflower oils. Avocado. °Seasoning and other foods °Herbs. Spices. Seasoning mixes without salt. Unsalted popcorn and pretzels. Fat-free sweets. °What foods are not recommended? °The items listed may not be a complete list. Talk with your dietitian about what dietary choices are best for you. °Grains °Baked goods made with fat, such as croissants, muffins, or some breads. Dry pasta or rice meal packs. °Vegetables °Creamed or fried vegetables. Vegetables in a cheese sauce. Regular canned vegetables (not low-sodium or reduced-sodium). Regular canned tomato sauce and paste (not low-sodium or reduced-sodium). Regular tomato and vegetable juice (not low-sodium or reduced-sodium). Pickles. Olives. °Fruits °Canned fruit in a light or heavy syrup. Fried fruit. Fruit in cream or butter sauce. °Meat and other protein foods °Fatty cuts of meat. Ribs. Fried meat. Bacon. Sausage. Bologna and other processed lunch  meats. Salami. Fatback. Hotdogs. Bratwurst. Salted nuts and seeds. Canned beans with added salt. Canned or smoked fish. Whole eggs or egg yolks. Chicken or turkey with skin. °Dairy °Whole or 2% milk, cream, and half-and-half. Whole or full-fat cream cheese. Whole-fat or sweetened yogurt. Full-fat cheese. Nondairy creamers. Whipped toppings. Processed cheese and cheese spreads. °Fats and oils °Butter. Stick margarine. Lard. Shortening. Ghee. Bacon fat. Tropical oils, such as coconut, palm kernel, or palm oil. °Seasoning and other foods °Salted popcorn and pretzels. Onion salt, garlic salt, seasoned salt, table salt, and sea salt. Worcestershire sauce. Tartar sauce. Barbecue sauce. Teriyaki sauce. Soy sauce, including reduced-sodium. Steak sauce. Canned and packaged gravies. Fish sauce. Oyster sauce. Cocktail sauce. Horseradish that you find on the shelf. Ketchup. Mustard. Meat flavorings and tenderizers. Bouillon cubes. Hot sauce and Tabasco sauce. Premade or packaged marinades. Premade or packaged taco seasonings. Relishes. Regular salad dressings. °Where to find more information: °· National Heart, [employer], and [employer] Institute: www.nhlbi.nih.gov °· American Heart Association: www.heart.org °Summary °· The DASH eating plan is a healthy eating plan that has been shown to reduce high blood pressure (hypertension). It may also reduce your risk for type 2 diabetes, heart disease, and stroke. °· With the DASH eating plan, you should limit salt (sodium) intake to 2,300 mg a day. If you have hypertension, you may need to reduce your sodium intake to 1,500 mg a day. °· When on the DASH eating plan, aim to eat more fresh fruits and vegetables, whole grains, lean proteins, low-fat dairy, and heart-healthy fats. °· Work with your health care provider or diet and nutrition specialist (dietitian) to adjust your eating plan to your individual calorie needs. °This information is not intended to replace advice given to you by your  health care provider. Make sure you discuss any questions you have with your health care provider. °Document Revised: 09/26/2017 Document Reviewed: 10/07/2016 °Elsevier Patient Education © 2020 [employer] ° °

## 2020-03-16 NOTE — Progress Notes (Signed)
Established Patient Office Visit  Subjective:  Patient ID: Laurie Kirby, female    DOB: 1961-04-22  Age: 59 y.o. MRN: [id number]  CC: No chief complaint on file.   HPI Murna Backer presents for follow up of Type 2 diabetes mellitus , hypertension, lab review and medication refill. Her HgbA1c done on 03/08/2020 increased from 7.5% to 9.1%. She is non compliant with her medication. She states that she takes 10 mg Glipizide daily instead of bid and Metoprolol 50 mg daily instead of bid. She states that she has not been checking her blood glucose and blood pressure as she should. Her blood glucose was checked during visit and it was 167 mg/dl, she denies hypoglycemic/hyperglycemic symptoms and peripheral neuropathy. She continues to smoke 1/2 pack of cigarette daily and admits the desire to quit. Her Lipid panel, total cholesterol decreased from 227 mg/dl to 206 mg/dl, Triglycerides decreased from 171 mg/dl to 108 mg/dl, LDL has a one point increase to 146 mg/dl. Overall, she states that she's doing well and offers no further complaint.  Past Medical History:  Diagnosis Date  . Diabetes mellitus without complication ([place])   . Hyperlipidemia   . Hypertension     Past Surgical History:  Procedure Laterality Date  . CORONARY ANGIOPLASTY WITH STENT PLACEMENT      Family History  Problem Relation Age of Onset  . Diabetes type II Mother   . Diabetes type II Father   . Heart disease Father   . Cancer Maternal Aunt   . Breast cancer Cousin     Social History   Socioeconomic History  . Marital status: Single    Spouse name: Not on file  . Number of children: 3  . Years of education: Not on file  . Highest education level: High school graduate  Occupational History  . Occupation: unemployed  Tobacco Use  . Smoking status: Current Some Day Smoker    Types: Cigarettes    Last attempt to quit: 12/07/2013    Years since quitting: 6.2  . Smokeless tobacco: Never Used  Substance and  Sexual Activity  . Alcohol use: Yes    Alcohol/week: 4.0 standard drinks    Types: 4 Glasses of wine per week    Comment:  wine on the weekends  . Drug use: No  . Sexual activity: Yes  Other Topics Concern  . Not on file  Social History Narrative   A friend supports. Not on food stamps. Pretty well taken care of by friend.    Social Determinants of Health   Financial Resource Strain:   . Difficulty of Paying Living Expenses:   Food Insecurity:   . Worried About Charity fundraiser in the Last Year:   . Arboriculturist in the Last Year:   Transportation Needs:   . Film/video editor (Medical):   Marland Kitchen Lack of Transportation (Non-Medical):   Physical Activity:   . Days of Exercise per Week:   . Minutes of Exercise per Session:   Stress:   . Feeling of Stress :   Social Connections:   . Frequency of Communication with Friends and Family:   . Frequency of Social Gatherings with Friends and Family:   . Attends Religious Services:   . Active Member of Clubs or Organizations:   . Attends Archivist Meetings:   Marland Kitchen Marital Status:   Intimate Partner Violence:   . Fear of Current or Ex-Partner:   . Emotionally Abused:   .  Physically Abused:   . Sexually Abused:     Outpatient Medications Prior to Visit  Medication Sig Dispense Refill  . Ascorbic Acid (VITAMIN C) 1000 MG tablet Take 1,000 mg by mouth daily.    Marland Kitchen aspirin EC 81 MG tablet Take 1 tablet (81 mg total) by mouth daily. 90 tablet 3  . atorvastatin (LIPITOR) 40 MG tablet TAKE 1 TABLET BY MOUTH DAILY 90 tablet 1  . lisinopril (ZESTRIL) 40 MG tablet TAKE 1 TABLET BY MOUTH DAILY 30 tablet 3  . metFORMIN (GLUCOPHAGE) 1000 MG tablet Take 1.5 tablets (1,500 mg total) by mouth daily with breakfast. She takes 1000 mg at breakfast and 500 mg at dinner 180 tablet 1  . clopidogrel (PLAVIX) 75 MG tablet Take 1 tablet (75 mg total) by mouth daily. 90 tablet 1  . famotidine (PEPCID) 20 MG tablet Take 1 tablet (20 mg total) by  mouth 2 (two) times daily. 30 tablet 6  . ibuprofen (ADVIL) 800 MG tablet TAKE 1 TABLET BY MOUTH EVERY 8 HOURS AS NEEDED. 60 tablet 1  . isosorbide mononitrate (IMDUR) 30 MG 24 hr tablet Take 1 tablet (30 mg total) by mouth daily. 90 tablet 1  . metoprolol tartrate (LOPRESSOR) 50 MG tablet TAKE 1 TABLET BY MOUTH TWICE A DAY 60 tablet 2  . mometasone (ELOCON) 0.1 % ointment Apply topically daily. 45 g 3  . sitaGLIPtin (JANUVIA) 50 MG tablet Take 0.5 tablets (25 mg total) by mouth daily. (Patient not taking: Reported on 09/13/2019) 30 tablet 0  . glipiZIDE (GLUCOTROL) 10 MG tablet Take 1 tablet (10 mg total) by mouth 2 (two) times daily before a meal. 180 tablet 3   No facility-administered medications prior to visit.    No Known Allergies  ROS Review of Systems  Constitutional: Negative.   Eyes: Negative.   Respiratory: Negative.   Cardiovascular: Negative.   Endocrine: Negative.   Neurological: Negative.   Psychiatric/Behavioral: Negative.       Objective:    Physical Exam  Constitutional: She is oriented to person, place, and time. She appears well-developed and well-nourished.  HENT:  Head: Normocephalic and atraumatic.  Eyes: Pupils are equal, round, and reactive to light. EOM are normal.  Cardiovascular: Normal rate.  Pulmonary/Chest: Effort normal and breath sounds normal.  Neurological: She is alert and oriented to person, place, and time.  Psychiatric: She has a normal mood and affect. Her behavior is normal. Judgment and thought content normal.    BP (!) 160/94 (BP Location: Left Arm, Patient Position: Sitting)   Pulse 98   Temp 98.1 F (36.7 C)   Resp 16   Ht 5\' 3"  (1.6 m)   Wt 147 lb 11.2 oz (67 kg)   SpO2 97%   BMI 26.16 kg/m  Wt Readings from Last 3 Encounters:  03/16/20 147 lb 11.2 oz (67 kg)  03/08/20 150 lb 1.6 oz (68.1 kg)  02/24/20 149 lb 12.8 oz (67.9 kg)     Health Maintenance Due  Topic Date Due  . Hepatitis C Screening  Never done  . HIV  Screening  Never done  . TETANUS/TDAP  Never done  . PAP SMEAR-Modifier  10/10/2018  . MAMMOGRAM  10/29/2019    There are no preventive care reminders to display for this patient.  Lab Results  Component Value Date   TSH 1.590 05/08/2017   Lab Results  Component Value Date   WBC 15.2 (H) 08/04/2019   HGB 15.4 08/04/2019   HCT 45.4 08/04/2019  MCV 90 08/04/2019   PLT 292 08/04/2019   Lab Results  Component Value Date   NA 142 11/11/2019   K 4.1 11/11/2019   CO2 25 11/11/2019   GLUCOSE 140 (H) 11/11/2019   BUN 13 11/11/2019   CREATININE 0.61 11/11/2019   BILITOT <0.2 11/11/2019   ALKPHOS 66 11/11/2019   AST 12 11/11/2019   ALT 12 11/11/2019   PROT 7.3 11/11/2019   ALBUMIN 4.2 11/11/2019   CALCIUM 9.8 11/11/2019   ANIONGAP 7 10/02/2017   Lab Results  Component Value Date   CHOL 206 (H) 03/08/2020   Lab Results  Component Value Date   HDL 40 03/08/2020   Lab Results  Component Value Date   LDLCALC 146 (H) 03/08/2020   Lab Results  Component Value Date   TRIG 108 03/08/2020   Lab Results  Component Value Date   CHOLHDL 5.2 (H) 03/08/2020   Lab Results  Component Value Date   HGBA1C 9.1 (H) 03/08/2020      Assessment & Plan:     1. Type 2 diabetes mellitus without complication, without long-term current use of insulin (HCC) - Her HgbA1c was 9.1%, her goal should be less than 7%. She was advised to check her blood glucose at least bid, fasting reading should between 80-130 mg/dl. She was advised to continue on low carb/non concentrated sweet diet. - HgB A1c; Future - glipiZIDE (GLUCOTROL) 10 MG tablet; Take 1 tablet (10 mg total) by mouth 2 (two) times daily before a meal.  Dispense: 180 tablet; Refill: 3  2. Hyperlipidemia, unspecified hyperlipidemia type -Her ASCVD 10 year risk was 53.5%, she was encouraged to continue on 40 mg Atorvastatin, low fat/low cholesterol diet. - clopidogrel (PLAVIX) 75 MG tablet; Take 1 tablet (75 mg total) by mouth  daily.  Dispense: 90 tablet; Refill: 1 - famotidine (PEPCID) 20 MG tablet; Take 1 tablet (20 mg total) by mouth 2 (two) times daily.  Dispense: 30 tablet; Refill: 6 - Lipid panel; Future  3. 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 - clopidogrel (PLAVIX) 75 MG tablet; Take 1 tablet (75 mg total) by mouth daily.  Dispense: 90 tablet; Refill: 1 - isosorbide mononitrate (IMDUR) 30 MG 24 hr tablet; Take 1 tablet (30 mg total) by mouth daily.  Dispense: 90 tablet; Refill: 1  4. Essential hypertension - Her blood pressure is not controlled, her goal is less than 140/90. She was encouraged to continue on current treatment regimen, check blood pressure, record and bring log to follow up appointment. She was advised to continue on DASH diet and smoking cessation. - metoprolol tartrate (LOPRESSOR) 50 MG tablet; Take 1 tablet (50 mg total) by mouth 2 (two) times daily.  Dispense: 60 tablet; Refill: 2  5. Health care maintenance  - Ambulatory referral to Hematology / Oncology for Mammogram and Pap smear.  6. Right hip pain - She will continue on current treatment regimen. - ibuprofen (ADVIL) 800 MG tablet; Take 1 tablet (800 mg total) by mouth every 8 (eight) hours as needed.  Dispense: 30 tablet; Refill: 0    Follow-up: Return in about 3 months (around 06/14/2020), or if symptoms worsen or fail to improve.    Buelah Rennie Trellis Paganini, NP

## 2020-04-25 ENCOUNTER — Ambulatory Visit: Payer: Self-pay | Admitting: Specialist

## 2020-05-09 ENCOUNTER — Ambulatory Visit: Payer: Self-pay | Admitting: Specialist

## 2020-05-16 ENCOUNTER — Other Ambulatory Visit: Payer: Self-pay

## 2020-05-16 ENCOUNTER — Ambulatory Visit: Payer: Self-pay | Admitting: Endocrinology

## 2020-05-16 VITALS — BP 196/92 | HR 75 | Ht 63.0 in | Wt 145.3 lb

## 2020-05-16 DIAGNOSIS — I10 Essential (primary) hypertension: Secondary | ICD-10-CM

## 2020-05-16 DIAGNOSIS — E1165 Type 2 diabetes mellitus with hyperglycemia: Secondary | ICD-10-CM

## 2020-05-16 MED ORDER — SEMAGLUTIDE 3 MG PO TABS: ORAL_TABLET | ORAL | 0 refills | Status: DC

## 2020-05-16 MED ORDER — CHLORTHALIDONE 25 MG PO TABS: 25.0000 mg | ORAL_TABLET | Freq: Every day | ORAL | 2 refills | Status: DC

## 2020-05-16 MED ORDER — SEMAGLUTIDE 7 MG PO TABS: 7.0000 mg | ORAL_TABLET | Freq: Every day | ORAL | 2 refills | Status: DC

## 2020-05-17 ENCOUNTER — Other Ambulatory Visit: Payer: Self-pay

## 2020-05-17 ENCOUNTER — Telehealth: Payer: Self-pay | Admitting: Gerontology

## 2020-05-17 MED ORDER — CHLORTHALIDONE 25 MG PO TABS: 25.0000 mg | ORAL_TABLET | Freq: Every day | ORAL | 2 refills | Status: DC

## 2020-05-17 MED ORDER — SEMAGLUTIDE 3 MG PO TABS: ORAL_TABLET | ORAL | 0 refills | Status: DC

## 2020-05-17 MED ORDER — SEMAGLUTIDE 7 MG PO TABS: 7.0000 mg | ORAL_TABLET | Freq: Every day | ORAL | 2 refills | Status: DC

## 2020-05-17 NOTE — Progress Notes (Signed)
Follow up Diabetes/ Endocrine [facility]     Patient ID: Laurie Kirby, female   DOB: July 03, 1961, 59 y.o.   MRN: [id number] Assessment:  Laurie Kirby is a 60 y.o. female who is seen in follow up for No primary diagnosis found. at the request of [name], [name], NP.  Encounter Diagnoses No diagnosis found.  Assessment  Patient is a 59 year old female with a history of T2DM, HTN and smoking who presents with bilateral itching in her feet. Her diabetes treatment is not at goal given her recent HbA1c increase to 9.1 in April from 7.5 in January. Her blood pressure was 196/92 in clinic, which may be due to the fact that she had not been able to refill her medications for the past week.   Plan:   T2DM - Start semaglutide 3mg  tab daily in the morning. Patient was instructed to take medication on an empty stomach and 30 min prior to eating. Patient was also educated on the side effects of the medication. If no appreciable changes, consider empagliflozin.  - Refrain from soda and dessert consumption. - Return next month (August) for routine T2DM labs. - Educate patient on the importance of adhering to medications and refilling them on time.  Itching  - Rub lotion on feet to prevent dryness. - If itching persists and signs of peripheral neuropathy develop, prescribe gabapentin.  HTN - Return to clinic in 1 week for BP measurements - If BP are still elevated (>140 systolic), start chlorthalidone. 25mg  daily - Educate patient on the importance of adhering to medications and refilling them on time.  Smoking - Refer to [place] [facility]uit line to obtain low-dose patches free of charge.    There are no Patient Instructions on file for this visit.   No orders of the defined types were placed in this encounter.    Subjective:    Patient is a 59 year-old female with a "couple of years" history of T2DM and history of HTN and smoking who presents with "itching" in the bottom of her feet for the past  2 months. She did not identify any precipitating event. She described the itching as sporadic, "not really bothersome," and relieved by scratching. She denied any "tingling," bruises, scars, or injury. She denied ever having had a foot exam. She denied any hyperglycemic symptoms. She denied measuring her blood glucose because she doesn't "like poking" herself due to pain. She stated that her last eye exam was 2 months ago and had her glasses. She stated that she had not been able to take her medications last week as she did not go for her refills. Otherwise, she reported taking her medications as prescribed when they are available. She denied any issues affording her medications or any side effects. She reported drinking 3-4 8-oz bottles per day, which is her average intake. Her diet for the day consisted of sausage for breakfast, cracker with peanut butter and jelly for lunch, and some orange soda. She stated eating some vegetables as well as cookies and and cake on occasions. She did not report any physical activity. She smokes 5 cigarettes/day, same as for the past few years. She stated wanting to quit; she had stopped smoking for 2 years in the past.   Diabetes She presents for her follow-up diabetic visit. She has type 2 diabetes mellitus. The initial diagnosis of diabetes was made 2 years ago. Her disease course has been worsening. Pertinent negatives for hypoglycemia include no dizziness, sweats or tremors. Pertinent negatives  for diabetes include no blurred vision, no chest pain, no foot ulcerations, no polydipsia and no polyuria. Diabetic complications include retinopathy. Risk factors for coronary artery disease include diabetes mellitus, hypertension and tobacco exposure. Current diabetic treatment includes oral agent (dual therapy). She is compliant with treatment most of the time. She never participates in exercise. Eye exam is current.    Review of Systems  Eyes: Negative for blurred vision.   Cardiovascular: Negative for chest pain.  Endocrine: Negative for polydipsia and polyuria.  Neurological: Negative for dizziness and tremors.    Laurie Kirby  has a past medical history of Diabetes mellitus without complication (HCC), Hyperlipidemia, and Hypertension.  Family History, Social History, current Medications and allergies reviewed and updated in Epic.  Objective:    Blood pressure (!) 196/92, pulse 75, height 5\' 3"  (1.6 m), weight 145 lb 4.8 oz (65.9 kg), SpO2 100 %. Physical Exam Constitutional:      General: She is not in acute distress.    Appearance: Normal appearance. She is normal weight. She is not ill-appearing.  HENT:     Head: Normocephalic and atraumatic.  Cardiovascular:     Rate and Rhythm: Normal rate and regular rhythm.  Pulmonary:     Effort: Pulmonary effort is normal.     Breath sounds: Normal breath sounds.  Abdominal:     General: Abdomen is flat.     Palpations: Abdomen is soft.  Neurological:     Mental Status: She is alert.    Monofilament foot exam 7/8 R, 8/8 L      Data : I have personally reviewed pertinent labs and imaging studies, if indicated,  with the patient in clinic today.   Lab Orders  No laboratory test(s) ordered today    HC Readings from Last 3 Encounters:  No data found for [employer]    Wt Readings from Last 3 Encounters:  05/16/20 145 lb 4.8 oz (65.9 kg)  03/16/20 147 lb 11.2 oz (67 kg)  03/08/20 150 lb 1.6 oz (68.1 kg)

## 2020-05-25 ENCOUNTER — Ambulatory Visit: Payer: Self-pay | Admitting: Gerontology

## 2020-06-17 ENCOUNTER — Other Ambulatory Visit: Payer: Self-pay | Admitting: Gerontology

## 2020-06-17 DIAGNOSIS — I10 Essential (primary) hypertension: Secondary | ICD-10-CM

## 2020-06-20 ENCOUNTER — Other Ambulatory Visit: Payer: Self-pay | Admitting: Urology

## 2020-06-20 ENCOUNTER — Other Ambulatory Visit: Payer: Self-pay | Admitting: Gerontology

## 2020-06-22 ENCOUNTER — Other Ambulatory Visit: Payer: Self-pay

## 2020-06-28 ENCOUNTER — Ambulatory Visit: Payer: Self-pay | Admitting: Gerontology

## 2020-06-29 ENCOUNTER — Other Ambulatory Visit: Payer: Self-pay

## 2020-06-29 DIAGNOSIS — E119 Type 2 diabetes mellitus without complications: Secondary | ICD-10-CM

## 2020-06-29 DIAGNOSIS — E785 Hyperlipidemia, unspecified: Secondary | ICD-10-CM

## 2020-06-30 LAB — LIPID PANEL
Chol/HDL Ratio: 2.7 ratio (ref 0.0–4.4)
Cholesterol, Total: 112 mg/dL (ref 100–199)
HDL: 42 mg/dL (ref >39)
LDL Chol Calc (NIH): 52 mg/dL (ref 0–99)
Triglycerides: 91 mg/dL (ref 0–149)
VLDL Cholesterol Cal: 18 mg/dL (ref 5–40)

## 2020-06-30 LAB — HEMOGLOBIN A1C
Est. average glucose Bld gHb Est-mCnc: 272 mg/dL
Hgb A1c MFr Bld: 11.1 % — ABNORMAL HIGH (ref 4.8–5.6)

## 2020-07-05 ENCOUNTER — Other Ambulatory Visit: Payer: Self-pay

## 2020-07-05 DIAGNOSIS — I10 Essential (primary) hypertension: Secondary | ICD-10-CM

## 2020-07-05 NOTE — Progress Notes (Unsigned)
a1c

## 2020-07-06 ENCOUNTER — Ambulatory Visit: Payer: Self-pay

## 2020-07-06 LAB — LIPID PANEL
Chol/HDL Ratio: 4.2 ratio (ref 0.0–4.4)
Cholesterol, Total: 204 mg/dL — ABNORMAL HIGH (ref 100–199)
HDL: 49 mg/dL (ref >39)
LDL Chol Calc (NIH): 139 mg/dL — ABNORMAL HIGH (ref 0–99)
Triglycerides: 89 mg/dL (ref 0–149)
VLDL Cholesterol Cal: 16 mg/dL (ref 5–40)

## 2020-07-06 LAB — HEMOGLOBIN A1C
Est. average glucose Bld gHb Est-mCnc: 166 mg/dL
Hgb A1c MFr Bld: 7.4 % — ABNORMAL HIGH (ref 4.8–5.6)

## 2020-07-13 ENCOUNTER — Other Ambulatory Visit: Payer: Self-pay

## 2020-07-13 ENCOUNTER — Ambulatory Visit: Payer: Self-pay | Admitting: Family Medicine

## 2020-07-13 VITALS — BP 160/78 | HR 75 | Wt 145.0 lb

## 2020-07-13 DIAGNOSIS — I10 Essential (primary) hypertension: Secondary | ICD-10-CM

## 2020-07-13 MED ORDER — FLUTICASONE-SALMETEROL 250-50 MCG/DOSE IN AEPB: 1.0000 | INHALATION_SPRAY | Freq: Two times a day (BID) | RESPIRATORY_TRACT | 2 refills | Status: DC

## 2020-07-13 MED ORDER — LISINOPRIL 40 MG PO TABS: 40.0000 mg | ORAL_TABLET | Freq: Every day | ORAL | 2 refills | Status: DC

## 2020-07-13 MED ORDER — ALBUTEROL SULFATE HFA 108 (90 BASE) MCG/ACT IN AERS: 2.0000 | INHALATION_SPRAY | Freq: Four times a day (QID) | RESPIRATORY_TRACT | 2 refills | Status: DC | PRN

## 2020-07-13 NOTE — Progress Notes (Signed)
  [facility]   Progress Note: General Provider: Mike Gip, FNP  SUBJECTIVE:   Laurie Kirby is a 59 y.o. female who  has a past medical history of Diabetes mellitus without complication (HCC), Hyperlipidemia, and Hypertension.. Patient presents today for Hypertension and Diabetes  Patient denies checking blood glucose levels or BP at home. She denies hypoglycemic episodes. She does not follow a heart healthy low sodium diet at the present time. She states that she is compliant with her medications. She continues to smoke daily.  Patient is being followed by endocrinology for her DM 2.   Review of Systems  Constitutional: Negative.   HENT: Negative.   Eyes: Negative.   Respiratory: Negative.   Cardiovascular: Negative.   Gastrointestinal: Negative.   Genitourinary: Negative.   Musculoskeletal: Negative.   Skin: Negative.   Neurological: Negative.   Psychiatric/Behavioral: Negative.      OBJECTIVE: BP (!) 160/78 (BP Location: Left Arm, Patient Position: Sitting)   Pulse 75   Wt 145 lb (65.8 kg)   SpO2 100%   BMI 25.69 kg/m   Wt Readings from Last 3 Encounters:  07/13/20 145 lb (65.8 kg)  05/16/20 145 lb 4.8 oz (65.9 kg)  03/16/20 147 lb 11.2 oz (67 kg)     Physical Exam Vitals and nursing note reviewed.  Constitutional:      General: She is not in acute distress.    Appearance: She is well-developed.  HENT:     Head: Normocephalic and atraumatic.  Eyes:     Conjunctiva/sclera: Conjunctivae normal.     Pupils: Pupils are equal, round, and reactive to light.  Cardiovascular:     Rate and Rhythm: Normal rate and regular rhythm.     Heart sounds: Normal heart sounds.  Pulmonary:     Effort: Pulmonary effort is normal. No respiratory distress.     Breath sounds: Normal breath sounds.  Abdominal:     General: Bowel sounds are normal. There is no distension.     Palpations: Abdomen is soft.  Musculoskeletal:        General: Normal range of  motion.     Cervical back: Normal range of motion.  Skin:    General: Skin is warm and dry.  Neurological:     Mental Status: She is alert and oriented to person, place, and time.  Psychiatric:        Mood and Affect: Mood normal.        Behavior: Behavior normal.        Thought Content: Thought content normal.        Judgment: Judgment normal.     ASSESSMENT/PLAN:  1. Essential hypertension - lisinopril (ZESTRIL) 40 MG tablet; Take 1 tablet (40 mg total) by mouth daily.  Dispense: 30 tablet; Refill: 2   Return in about 3 months (around 10/12/2020) for Endo appt scheduled for 08/15/2020.    The patient was given clear instructions to go to ER or return to medical center if symptoms do not improve, worsen or new problems develop. The patient verbalized understanding and agreed with plan of care.   Ms. Laurie Kirby. Laurie Lam, FNP-BC [facility]

## 2020-07-13 NOTE — Patient Instructions (Signed)

## 2020-08-15 ENCOUNTER — Other Ambulatory Visit: Payer: Self-pay

## 2020-08-15 ENCOUNTER — Ambulatory Visit: Payer: Self-pay

## 2020-08-15 VITALS — BP 179/83 | HR 86 | Temp 96.9°F | Resp 16 | Ht 67.0 in | Wt 145.6 lb

## 2020-08-15 DIAGNOSIS — E119 Type 2 diabetes mellitus without complications: Secondary | ICD-10-CM

## 2020-08-15 DIAGNOSIS — I10 Essential (primary) hypertension: Secondary | ICD-10-CM

## 2020-08-15 LAB — GLUCOSE, POCT (MANUAL RESULT ENTRY): POC Glucose: 178 mg/dl — AB (ref 70–99)

## 2020-08-15 LAB — POCT GLYCOSYLATED HEMOGLOBIN (HGB A1C)
HbA1c POC (<> result, manual entry): 7.5 % (ref 4.0–5.6)
HbA1c, POC (controlled diabetic range): 7.5 % — AB (ref 0.0–7.0)
HbA1c, POC (prediabetic range): 7.5 % — AB (ref 5.7–6.4)
Hemoglobin A1C: 7.5 % — AB (ref 4.0–5.6)

## 2020-08-15 MED ORDER — GLIPIZIDE 10 MG PO TABS: 10.0000 mg | ORAL_TABLET | Freq: Two times a day (BID) | ORAL | 3 refills | Status: DC

## 2020-08-15 MED ORDER — CHLORTHALIDONE 25 MG PO TABS: 12.5000 mg | ORAL_TABLET | Freq: Every day | ORAL | 2 refills | Status: DC

## 2020-08-15 MED ORDER — METFORMIN HCL 1000 MG PO TABS: ORAL_TABLET | ORAL | 1 refills | Status: DC

## 2020-08-16 ENCOUNTER — Other Ambulatory Visit: Payer: Self-pay | Admitting: Gerontology

## 2020-08-16 ENCOUNTER — Other Ambulatory Visit: Payer: Self-pay | Admitting: Urology

## 2020-08-16 DIAGNOSIS — E119 Type 2 diabetes mellitus without complications: Secondary | ICD-10-CM

## 2020-08-17 ENCOUNTER — Other Ambulatory Visit: Payer: Self-pay | Admitting: Gerontology

## 2020-08-17 DIAGNOSIS — Z01818 Encounter for other preprocedural examination: Secondary | ICD-10-CM

## 2020-08-18 ENCOUNTER — Other Ambulatory Visit: Payer: Self-pay | Admitting: Gerontology

## 2020-08-18 DIAGNOSIS — E119 Type 2 diabetes mellitus without complications: Secondary | ICD-10-CM

## 2020-08-21 NOTE — Progress Notes (Unsigned)
Follow up Diabetes/ End[facility]     Patient ID: Laurie Kirby, female   DOB: 08/21/1961, 59 y.o.   MRN: [id number] Assessment:  Laurie Kirby is a 59 y.o. female who is seen in follow up for Primary hypertension [I10] at the request of [name], [name], NP.  Encounter Diagnoses 1. Primary hypertension   2. Type 2 diabetes mellitus without complication, without long-term current use of insulin [facility])     Assessment  Patient is a 59 year old female with a history of T2DM, HTN, hyperlipidemia, and smoking. She is almost at goal for her T2DM treatment.   Plan:     - Continue with current DM medication regimen. - Encourage patient to follow up with [place] [facility]uit line. - Encourage patient to incorporate 20-min exercise/activity in her daily routine. - Start chlortalidone 25 mg tab. Encourage patient to follow up with PCP about persisting high blood pressure levels.   There are no Patient Instructions on file for this visit.   Orders Placed This Encounter  Procedures  . POCT Glucose (CBG)  . POCT HgB A1C     Subjective:  Patient reports that her T2DM management is going "very well." She states that she does not measure her blood glucose at home because "(she doesn't) like being poked." She denies any symptoms of hypo/hyperglycemia; numbness, pain, or tingling sensation in her hands or feet but reports some persisting itching at the bottom of her feet for the past 3 months. The itchiness is not resolves with application of lotion. She reports managing her DM with metformin/Glucophage 1,500 mg tab and glipizide/Glucotrol 10 mg tab bid; she denies taking semaglutide and chlortalidone which were prescribed at her last [facility] visit in August. She does not reports any side effects other than bowel movement when taking her pills. She denies any issues affording her medications and reports that she takes her medications except when she forgets (not very often).   She states that her most recent  eye exam was during the summer and her most recent foot exam was at her last [facility] visit in August.  Patient denies any weight change. She describes a typical breakfast as egg and sausage; she does not eat lunch and states that her dinner varies but usually includes vegetables, and fried chicken at times. She denies drinking soda. She states that she has smoked 5 cigarettes/day for the past 3 months, an increase from 3 cigarettes/day. She states that she had tried calling the [place] Quit line recommended at her last [facility] visit but "the line was busy." She denies any recreational drug use and reports drinking 1 glass of wine on weekends.    Review of Systems  Oneita Allmon  has a past medical history of Diabetes mellitus without complication (HCC), Hyperlipidemia, and Hypertension.  Family History, Social History, current Medications and allergies reviewed and updated in Epic.  Objective:    Blood pressure (!) 179/83, pulse 86, temperature (!) 96.9 F (36.1 C), resp. rate 16, height 5\' 7"  (1.702 m), weight 145 lb 9.6 oz (66 kg), SpO2 97 %. Physical Exam Constitutional:      Appearance: Normal appearance.  HENT:     Head: Normocephalic and atraumatic.  Cardiovascular:     Rate and Rhythm: Normal rate and regular rhythm.     Heart sounds: Normal heart sounds.  Pulmonary:     Effort: Pulmonary effort is normal.     Breath sounds: Normal breath sounds.  Abdominal:     Palpations: Abdomen  is soft.  Musculoskeletal:     Cervical back: Neck supple.  Neurological:     Mental Status: She is alert.         Data : I have personally reviewed pertinent labs and imaging studies, if indicated,  with the patient in clinic today.    Lab Orders     POCT Glucose (CBG)     POCT HgB A1C  HC Readings from Last 3 Encounters:  No data found for [facility]    Wt Readings from Last 3 Encounters:  08/15/20 145 lb 9.6 oz (66 kg)  07/13/20 145 lb (65.8 kg)  05/16/20 145 lb 4.8 oz (65.9 kg)

## 2020-09-05 ENCOUNTER — Encounter: Payer: Self-pay | Admitting: Cardiovascular Disease

## 2020-09-05 ENCOUNTER — Other Ambulatory Visit: Payer: Self-pay

## 2020-09-05 ENCOUNTER — Ambulatory Visit (INDEPENDENT_AMBULATORY_CARE_PROVIDER_SITE_OTHER): Payer: Self-pay | Admitting: Cardiovascular Disease

## 2020-09-05 VITALS — BP 180/100 | HR 74 | Ht 64.0 in | Wt 146.0 lb

## 2020-09-05 DIAGNOSIS — I10 Essential (primary) hypertension: Secondary | ICD-10-CM

## 2020-09-05 DIAGNOSIS — E782 Mixed hyperlipidemia: Secondary | ICD-10-CM

## 2020-09-05 DIAGNOSIS — Z01818 Encounter for other preprocedural examination: Secondary | ICD-10-CM

## 2020-09-05 DIAGNOSIS — Z720 Tobacco use: Secondary | ICD-10-CM

## 2020-09-05 DIAGNOSIS — E119 Type 2 diabetes mellitus without complications: Secondary | ICD-10-CM

## 2020-09-05 NOTE — Patient Instructions (Addendum)
Medication Instructions:  If top number on Blood Pressure is greater than 150 then take 2 tablets of Isosorbide mononitrate 30 mg for total of 60 mg.   Please take you medications, thank you!  If you need a refill on your cardiac medications before your next appointment, please call your pharmacy.   Please monitor blood pressures and keep a log of your readings. Make sure to check 2 hours after your medications.   AVOID these things for 30 minutes before checking your blood pressure:  Drinking caffeine.  Drinking alcohol.  Eating.  Smoking.  Exercising.  Five minutes before checking your blood pressure:  Pee.  Sit in a dining chair. Avoid sitting in a soft couch or armchair.  Be quiet. Do not talk.  Lab work: No new labs needed   If you have labs (blood work) drawn today and your tests are completely normal, you will receive your results only by: Marland Kitchen MyChart Message (if you have MyChart) OR . A paper copy in the mail If you have any lab test that is abnormal or we need to change your treatment, we will call you to review the results.   Testing/Procedures: No new testing needed   Follow-Up: At [facility], you and your health needs are our priority.  As part of our continuing mission to provide you with exceptional heart care, we have created designated Provider Care Teams.  These Care Teams include your primary Cardiologist (physician) and Advanced Practice Providers (APPs -  Physician Assistants and Nurse Practitioners) who all work together to provide you with the care you need, when you need it.  . You will need a follow up appointment as needed  . Providers on your designated Care Team:   . Nicolasa Ducking, NP . Eula Listen, PA-C . Marisue Ivan, PA-C  Any Other Special Instructions Will Be Listed Below (If Applicable).  COVID-19 Vaccine Information can be found at: [email or web address] For  questions related to vaccine distribution or appointments, please email [email or web address] or call [phone].

## 2020-09-05 NOTE — Progress Notes (Signed)
Cardiology Office Note  Date:  09/05/2020   ID:  Laurie Kirby, DOB June 05, 1961, MRN [id number]  PCP:  Rolm Gala, NP   Chief Complaint  Patient presents with  . New Patient (Initial Visit)    Cardiac clearance for dental work; Meds verbally reviewed with patient.    HPI:  Ms. Laurie Kirby is a 25 to 40 mg daily woman with past medical history of Hypertension Diabetes type 2 Smoker Hyperlipidemia CAD , PCI to RCA   05/2011 Who presents by referral from Dr. Linzie Collin for preop evaluation  Elevated blood pressure on visit July 2021 with primary care started on chlorthalidone 25 at that time  Follow-up visit September 2021 blood pressure 160/78 Lisinopril up to 40 daily  Follow-up visit August 15, 2020 with primary care blood pressure 180/83 Restarted on chlorthalidone  Medication list currently includes isosorbide 30 daily lisinopril 40 daily metoprolol 50 twice daily   chlorthalidone 12.5 daily , reports that she is not taking this  Lab work reviewed Total cholesterol 200 HBA1c 7.5  Prior records reviewed, catheterization August 2020, Records have been requested Stent placed to RCA  Have recurrent chest pain  stress test September 2012 no ischemia  She denies having any angina since that time, no shortness of breath on exertion  Smokes 5 cigarettes a day Medication noncompliance  EKG personally reviewed by myself on todays visit Shows normal sinus rhythm rate 74 bpm T wave abnormality 1, aVL, V3  PMH:   has a past medical history of Diabetes mellitus without complication (HCC), Hyperlipidemia, and Hypertension.  PSH:    Past Surgical History:  Procedure Laterality Date  . CORONARY ANGIOPLASTY WITH STENT PLACEMENT      Current Outpatient Medications  Medication Sig Dispense Refill  . famotidine (PEPCID) 20 MG tablet Take 20 mg by mouth 2 (two) times daily as needed for heartburn or indigestion.    Marland Kitchen glipiZIDE (GLUCOTROL) 10 MG tablet Take 20 mg by  mouth daily before breakfast.    . Ascorbic Acid (VITAMIN C) 1000 MG tablet Take 1,000 mg by mouth daily.    Marland Kitchen aspirin EC 81 MG tablet Take 1 tablet (81 mg total) by mouth daily. 90 tablet 3  . atorvastatin (LIPITOR) 40 MG tablet TAKE 1 TABLET BY MOUTH DAILY 90 tablet 1  . clopidogrel (PLAVIX) 75 MG tablet Take 1 tablet (75 mg total) by mouth daily. 90 tablet 1  . isosorbide mononitrate (IMDUR) 30 MG 24 hr tablet Take 1 tablet (30 mg total) by mouth daily. 90 tablet 1  . lisinopril (ZESTRIL) 40 MG tablet Take 1 tablet (40 mg total) by mouth daily. 30 tablet 2  . metFORMIN (GLUCOPHAGE) 1000 MG tablet TAKE ONE AND ONE-HALF TABLET BY MOUTH WITH BREAKFAST 180 tablet 1  . metoprolol tartrate (LOPRESSOR) 50 MG tablet TAKE 1 TABLET BY MOUTH TWICE A DAY 60 tablet 2  . mometasone (ELOCON) 0.1 % ointment Apply topically daily. 45 g 0   No current facility-administered medications for this visit.     Allergies:   Patient has no known allergies.   Social History:  The patient  reports that she has been smoking cigarettes. She has never used smokeless tobacco. She reports current alcohol use of about 1.0 standard drink of alcohol per week. She reports that she does not use drugs.   Family History:   family history includes Breast cancer in her cousin; Cancer in her maternal aunt; Diabetes type II in her father and mother; Heart disease in  her father.    Review of Systems: Review of Systems  Constitutional: Negative.   HENT: Negative.   Respiratory: Negative.   Cardiovascular: Negative.   Gastrointestinal: Negative.   Musculoskeletal: Negative.   Neurological: Negative.   Psychiatric/Behavioral: Negative.   All other systems reviewed and are negative.   PHYSICAL EXAM: VS:  BP (!) 180/100 (BP Location: Right Arm, Patient Position: Sitting, Cuff Size: Normal)   Pulse 74   Ht 5\' 4"  (1.626 m)   Wt 146 lb (66.2 kg)   SpO2 98%   BMI 25.06 kg/m  , BMI Body mass index is 25.06 kg/m. GEN: Well  nourished, well developed, in no acute distress HEENT: normal Neck: no JVD, carotid bruits, or masses Cardiac: RRR; no murmurs, rubs, or gallops,no edema  Respiratory:  clear to auscultation bilaterally, normal work of breathing GI: soft, nontender, nondistended, + BS MS: no deformity or atrophy Skin: warm and dry, no rash Neuro:  Strength and sensation are intact Psych: euthymic mood, full affect   Recent Labs: 11/11/2019: ALT 12; BUN 13; Creatinine, Ser 0.61; Potassium 4.1; Sodium 142    Lipid Panel Lab Results  Component Value Date   CHOL 204 (H) 07/05/2020   HDL 49 07/05/2020   LDLCALC 139 (H) 07/05/2020   TRIG 89 07/05/2020      Wt Readings from Last 3 Encounters:  09/05/20 146 lb (66.2 kg)  08/15/20 145 lb 9.6 oz (66 kg)  07/13/20 145 lb (65.8 kg)       ASSESSMENT AND PLAN:  Problem List Items Addressed This Visit      Cardiology Problems   Essential hypertension   Hyperlipidemia     Other   Diabetes mellitus without complication (HCC) - Primary (Chronic)   Relevant Medications   glipiZIDE (GLUCOTROL) 10 MG tablet   Tobacco abuse    Other Visit Diagnoses    Pre-op evaluation       Relevant Orders   EKG 12-Lead     Preop cardiovascular evaluation Scheduled to have many of her teeth pulled out late November at dental office Stressed importance of taking her blood pressure occasions including isosorbide 30 daily lisinopril 40 daily and metoprolol 50 twice daily -Given.  Currently she is not taking her medications Almost every doctor visit with poor control blood pressure Recommend she buy a pillbox, she can take some in the morning and evening but must take her pills -Suggested she call December in the next week or so with some blood pressure measurements prior to her dental office visit as she may need further medication titration -If blood pressure runs high would double the isosorbide up to 30 twice daily or 60 daily (this can be titrated further up if  needed) Currently not on chlorthalidone per patient.  We will continue with the 3 that she has as I do not think she is taking them  CAD with chronic stable angina Prior stent to the RCA 2012 Continues to smoke, has diabetes, poor control hyperlipidemia Denies anginal symptoms, no further testing at this time, risk factor modification addressed  Smoker We have encouraged her to continue to work on weaning her cigarettes and smoking cessation. She will continue to work on this and does not want any assistance with chantix.   Hyperlipidemia Stressed importance of compliance with her Lipitor As Lipitor 40 on her list, is not taking it Goal LDL less than 70 preferably 60  Essential hypertension Currently not taking her medications Stressed importance of taking what is listed  Discussed the importance and ramifications if she does not take her medications long-term including stroke, heart attack, kidney disease etc. -If additional medication needed with double or triple the isosorbide   Total encounter time more than 45 minutes  Greater than 50% was spent in counseling and coordination of care with the patient      Signed, Dossie Arbour, M.D., Ph.D. [facility] [place], [place] [phone]

## 2020-09-20 ENCOUNTER — Telehealth: Payer: Self-pay | Admitting: Pharmacist

## 2020-09-20 ENCOUNTER — Other Ambulatory Visit: Payer: Self-pay | Admitting: Gerontology

## 2020-09-20 DIAGNOSIS — E785 Hyperlipidemia, unspecified: Secondary | ICD-10-CM

## 2020-09-20 DIAGNOSIS — I10 Essential (primary) hypertension: Secondary | ICD-10-CM

## 2020-09-20 DIAGNOSIS — I25119 Atherosclerotic heart disease of native coronary artery with unspecified angina pectoris: Secondary | ICD-10-CM

## 2020-09-20 NOTE — Telephone Encounter (Signed)
Patient failed to provide requested 2021 financial documentation. No additional medication assistance will be provided by [facility] without the required proof of income documentation. Patient notified by letter Debra Cheek Administrative Assistant [facility] 

## 2020-10-12 ENCOUNTER — Other Ambulatory Visit: Payer: Self-pay

## 2020-10-12 ENCOUNTER — Ambulatory Visit: Payer: Self-pay | Admitting: Family Medicine

## 2020-10-12 VITALS — BP 198/95 | HR 70 | Ht 64.0 in | Wt 133.3 lb

## 2020-10-12 DIAGNOSIS — E785 Hyperlipidemia, unspecified: Secondary | ICD-10-CM

## 2020-10-12 DIAGNOSIS — Z720 Tobacco use: Secondary | ICD-10-CM

## 2020-10-12 DIAGNOSIS — K219 Gastro-esophageal reflux disease without esophagitis: Secondary | ICD-10-CM

## 2020-10-12 DIAGNOSIS — I25119 Atherosclerotic heart disease of native coronary artery with unspecified angina pectoris: Secondary | ICD-10-CM

## 2020-10-12 DIAGNOSIS — E119 Type 2 diabetes mellitus without complications: Secondary | ICD-10-CM

## 2020-10-12 DIAGNOSIS — I10 Essential (primary) hypertension: Secondary | ICD-10-CM

## 2020-10-12 MED ORDER — METOPROLOL TARTRATE 50 MG PO TABS: 50.0000 mg | ORAL_TABLET | Freq: Two times a day (BID) | ORAL | 0 refills | Status: DC

## 2020-10-12 MED ORDER — GLIPIZIDE 10 MG PO TABS: 20.0000 mg | ORAL_TABLET | Freq: Every day | ORAL | 0 refills | Status: DC

## 2020-10-12 MED ORDER — CLOPIDOGREL BISULFATE 75 MG PO TABS: 75.0000 mg | ORAL_TABLET | Freq: Every day | ORAL | 0 refills | Status: DC

## 2020-10-12 MED ORDER — METFORMIN HCL 1000 MG PO TABS: 1500.0000 mg | ORAL_TABLET | Freq: Every day | ORAL | 0 refills | Status: DC

## 2020-10-12 MED ORDER — ASPIRIN EC 81 MG PO TBEC: 81.0000 mg | DELAYED_RELEASE_TABLET | Freq: Every day | ORAL | 0 refills | Status: DC

## 2020-10-12 MED ORDER — FAMOTIDINE 20 MG PO TABS: 20.0000 mg | ORAL_TABLET | Freq: Two times a day (BID) | ORAL | 0 refills | Status: DC | PRN

## 2020-10-12 MED ORDER — ATORVASTATIN CALCIUM 40 MG PO TABS: 40.0000 mg | ORAL_TABLET | Freq: Every day | ORAL | 1 refills | Status: DC

## 2020-10-12 MED ORDER — ISOSORBIDE MONONITRATE ER 30 MG PO TB24: 30.0000 mg | ORAL_TABLET | Freq: Every day | ORAL | 0 refills | Status: DC

## 2020-10-12 MED ORDER — LISINOPRIL 40 MG PO TABS: 40.0000 mg | ORAL_TABLET | Freq: Every day | ORAL | 0 refills | Status: DC

## 2020-10-12 NOTE — Progress Notes (Signed)
[facility]   Progress Note: General Provider: Mike Gip, FNP  SUBJECTIVE:   Laurie Kirby is a 59 y.o. female who  has a past medical history of Diabetes mellitus without complication (HCC), Hyperlipidemia, and Hypertension.. Patient presents today for Medication Refill Patient reports being out of clopidogrel, Imdur, metformin for the past 2 weeks. She reports that she did not receive the sitagliptin. Non insulin dependent. Does not check FBS at home. Recently had dental work and stopped smoking. Last seen by cardiology in 08/17/2020.  She recently stopped smoking. She denies chest pain, sob, dizziness or leg swelling. No problems or concerns today.    Review of Systems  Constitutional: Negative.   HENT: Negative.   Eyes: Negative.   Respiratory: Negative.   Cardiovascular: Negative.   Gastrointestinal: Negative.   Genitourinary: Negative.   Musculoskeletal: Negative.   Skin: Negative.   Neurological: Negative.   Psychiatric/Behavioral: Negative.      OBJECTIVE: BP (!) 198/95   Pulse 70   Ht 5\' 4"  (1.626 m)   Wt 133 lb 4.8 oz (60.5 kg)   SpO2 100%   BMI 22.88 kg/m   Wt Readings from Last 3 Encounters:  10/12/20 133 lb 4.8 oz (60.5 kg)  09/05/20 146 lb (66.2 kg)  08/15/20 145 lb 9.6 oz (66 kg)     Physical Exam Vitals and nursing note reviewed.  Constitutional:      General: She is not in acute distress.    Appearance: Normal appearance.  HENT:     Head: Normocephalic and atraumatic.  Eyes:     Extraocular Movements: Extraocular movements intact.     Conjunctiva/sclera: Conjunctivae normal.     Pupils: Pupils are equal, round, and reactive to light.  Cardiovascular:     Rate and Rhythm: Normal rate and regular rhythm.     Heart sounds: No murmur heard.   Pulmonary:     Effort: Pulmonary effort is normal.     Breath sounds: Normal breath sounds.  Musculoskeletal:        General: Normal range of motion.  Skin:    General: Skin is  warm and dry.  Neurological:     Mental Status: She is alert and oriented to person, place, and time.  Psychiatric:        Mood and Affect: Mood normal.        Behavior: Behavior normal.        Thought Content: Thought content normal.        Judgment: Judgment normal.     ASSESSMENT/PLAN:  1. Hyperlipidemia, unspecified hyperlipidemia type - clopidogrel (PLAVIX) 75 MG tablet; Take 1 tablet (75 mg total) by mouth daily.  Dispense: 90 tablet; Refill: 0  2. 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  Dispense: 90 tablet; Refill: 0 - aspirin EC 81 MG tablet; Take 1 tablet (81 mg total) by mouth daily.  Dispense: 90 tablet; Refill: 0 - isosorbide mononitrate (IMDUR) 30 MG 24 hr tablet; Take 1 tablet (30 mg total) by mouth daily.  Dispense: 90 tablet; Refill: 0 - Comprehensive metabolic panel; Future - Lipid Panel With LDL/HDL Ratio; Future - TSH; Future - HgB A1c; Future  3. Diabetes mellitus without complication (HCC) - atorvastatin (LIPITOR) 40 MG tablet; Take 1 tablet (40 mg total) by mouth daily.  Dispense: 90 tablet; Refill: 1 - metFORMIN (GLUCOPHAGE) 1000 MG  tablet; Take 1.5 tablets (1,500 mg total) by mouth daily with breakfast.  Dispense: 135 tablet; Refill: 0 - glipiZIDE (GLUCOTROL) 10 MG tablet; Take 2 tablets (20 mg total) by mouth daily before breakfast.  Dispense: 90 tablet; Refill: 0 - Comprehensive metabolic panel; Future - Lipid Panel With LDL/HDL Ratio; Future - TSH; Future - HgB A1c; Future  4. Essential hypertension - lisinopril (ZESTRIL) 40 MG tablet; Take 1 tablet (40 mg total) by mouth daily.  Dispense: 90 tablet; Refill: 0 - metoprolol tartrate (LOPRESSOR) 50 MG tablet; Take 1 tablet (50 mg total) by mouth 2 (two) times daily.  Dispense: 180 tablet; Refill: 0  5. Type 2 diabetes mellitus without  complication, without long-term current use of insulin (HCC) - metFORMIN (GLUCOPHAGE) 1000 MG tablet; Take 1.5 tablets (1,500 mg total) by mouth daily with breakfast.  Dispense: 135 tablet; Refill: 0  6. Gastroesophageal reflux disease without esophagitis - famotidine (PEPCID) 20 MG tablet; Take 1 tablet (20 mg total) by mouth 2 (two) times daily as needed for heartburn or indigestion.  Dispense: 180 tablet; Refill: 0 7. Tobacco Abuse Smoking cessation instruction/counseling given:  commended patient for quitting and reviewed strategies for preventing relapses   Return in about 3 months (around 01/10/2021), or labs 1 week prior, for DM2.    The patient was given clear instructions to go to ER or return to medical center if symptoms do not improve, worsen or new problems develop. The patient verbalized understanding and agreed with plan of care.   Ms. Laurie Kirby. Laurie Lam, FNP-BC [facility]

## 2020-10-12 NOTE — Patient Instructions (Signed)
Diabetes Mellitus and Standards of Medical Care Managing diabetes (diabetes mellitus) can be complicated. Your diabetes treatment may be managed by a team of health care providers, including:  A physician who specializes in diabetes (endocrinologist).  A nurse practitioner or physician assistant.  Nurses.  A diet and nutrition specialist (registered dietitian).  A certified diabetes educator (CDE).  An exercise specialist.  A pharmacist.  An eye doctor.  A foot specialist (podiatrist).  A dentist.  A primary care provider.  A mental health provider. Your health care providers follow guidelines to help you get the best quality of care. The following schedule is a general guideline for your diabetes management plan. Your health care providers may give you more specific instructions. Physical exams Upon being diagnosed with diabetes mellitus, and each year after that, your health care provider will ask about your medical and family history. He or she will also do a physical exam. Your exam may include:  Measuring your height, weight, and body mass index (BMI).  Checking your blood pressure. This will be done at every routine medical visit. Your target blood pressure may vary depending on your medical conditions, your age, and other factors.  Thyroid gland exam.  Skin exam.  Screening for damage to your nerves (peripheral neuropathy). This may include checking the pulse in your legs and feet and checking the level of sensation in your hands and feet.  A complete foot exam to inspect the structure and skin of your feet, including checking for cuts, bruises, redness, blisters, sores, or other problems.  Screening for blood vessel (vascular) problems, which may include checking the pulse in your legs and feet and checking your temperature. Blood tests Depending on your treatment plan and your personal needs, you may have the following tests done:  HbA1c (hemoglobin A1c). This  test provides information about blood sugar (glucose) control over the previous 2-3 months. It is used to adjust your treatment plan, if needed. This test will be done: ? At least 2 times a year, if you are meeting your treatment goals. ? 4 times a year, if you are not meeting your treatment goals or if treatment goals have changed.  Lipid testing, including total, LDL, and HDL cholesterol and triglyceride levels. ? The goal for LDL is less than 100 mg/dL (5.5 mmol/L). If you are at high risk for complications, the goal is less than 70 mg/dL (3.9 mmol/L). ? The goal for HDL is 40 mg/dL (2.2 mmol/L) or higher for men and 50 mg/dL (2.8 mmol/L) or higher for women. An HDL cholesterol of 60 mg/dL (3.3 mmol/L) or higher gives some protection against heart disease. ? The goal for triglycerides is less than 150 mg/dL (8.3 mmol/L).  Liver function tests.  Kidney function tests.  Thyroid function tests. Dental and eye exams  Visit your dentist two times a year.  If you have type 1 diabetes, your health care provider may recommend an eye exam 3-5 years after you are diagnosed, and then once a year after your first exam. ? For children with type 1 diabetes, a health care provider may recommend an eye exam when your child is age 10 or older and has had diabetes for 3-5 years. After the first exam, your child should get an eye exam once a year.  If you have type 2 diabetes, your health care provider may recommend an eye exam as soon as you are diagnosed, and then once a year after your first exam. Immunizations   The   yearly flu (influenza) vaccine is recommended for everyone 6 months or older who has diabetes.  The pneumonia (pneumococcal) vaccine is recommended for everyone 2 years or older who has diabetes. If you are 65 or older, you may get the pneumonia vaccine as a series of two separate shots.  The hepatitis B vaccine is recommended for adults shortly after being diagnosed with  diabetes.  Adults and children with diabetes should receive all other vaccines according to age-specific recommendations from the Centers for Disease Control and Prevention (CDC). Mental and emotional health Screening for symptoms of eating disorders, anxiety, and depression is recommended at the time of diagnosis and afterward as needed. If your screening shows that you have symptoms (positive screening result), you may need more evaluation and you may work with a mental health care provider. Treatment plan Your treatment plan will be reviewed at every medical visit. You and your health care provider will discuss:  How you are taking your medicines, including insulin.  Any side effects you are experiencing.  Your blood glucose target goals.  The frequency of your blood glucose monitoring.  Lifestyle habits, such as activity level as well as tobacco, alcohol, and substance use. Diabetes self-management education Your health care provider will assess how well you are monitoring your blood glucose levels and whether you are taking your insulin correctly. He or she may refer you to:  A certified diabetes educator to manage your diabetes throughout your life, starting at diagnosis.  A registered dietitian who can create or review your personal nutrition plan.  An exercise specialist who can discuss your activity level and exercise plan. Summary  Managing diabetes (diabetes mellitus) can be complicated. Your diabetes treatment may be managed by a team of health care providers.  Your health care providers follow guidelines in order to help you get the best quality of care.  Standards of care including having regular physical exams, blood tests, blood pressure monitoring, immunizations, screening tests, and education about how to manage your diabetes.  Your health care providers may also give you more specific instructions based on your individual health. This information is not intended  to replace advice given to you by your health care provider. Make sure you discuss any questions you have with your health care provider. Document Revised: 07/03/2018 Document Reviewed: 07/12/2016 Elsevier Patient Education  2020 Elsevier Inc.  

## 2020-10-17 ENCOUNTER — Telehealth: Payer: Self-pay | Admitting: Pharmacy Technician

## 2020-10-17 NOTE — Telephone Encounter (Signed)
Patient needs to complete Patient Intake Application, DOH Attestation, Patient Advocate Letter and Contract.  Mailed to patient to complete and sign.  Documentation needs to be completed before eligibility can be determined.  Sherilyn Dacosta Care Manager [facility]

## 2020-12-06 ENCOUNTER — Other Ambulatory Visit: Payer: Self-pay

## 2020-12-07 ENCOUNTER — Telehealth: Payer: Self-pay | Admitting: Gerontology

## 2020-12-07 NOTE — Telephone Encounter (Signed)
Called on 2/10 to reschedule appt per staff message but no answer and unable to leave message

## 2020-12-13 ENCOUNTER — Telehealth: Payer: Self-pay | Admitting: Pharmacist

## 2020-12-13 NOTE — Telephone Encounter (Signed)
12/13/2020 8:51:30 AM - Advair & ProAir HFA pending  -- Rhetta Mura - Wednesday, December 13, 2020 8:50 AM --I have received the signed provider portion of applications for Advair & ProAir--holding for patient to return her portion. Kathie Rhodes mailed to patient 12/08/2020.

## 2020-12-14 ENCOUNTER — Ambulatory Visit: Payer: Self-pay | Admitting: Gerontology

## 2021-01-11 ENCOUNTER — Other Ambulatory Visit: Payer: Self-pay

## 2021-01-11 DIAGNOSIS — I25119 Atherosclerotic heart disease of native coronary artery with unspecified angina pectoris: Secondary | ICD-10-CM

## 2021-01-11 DIAGNOSIS — E119 Type 2 diabetes mellitus without complications: Secondary | ICD-10-CM

## 2021-01-12 LAB — LIPID PANEL WITH LDL/HDL RATIO
Cholesterol, Total: 202 mg/dL — ABNORMAL HIGH (ref 100–199)
HDL: 39 mg/dL — ABNORMAL LOW (ref >39)
LDL Chol Calc (NIH): 147 mg/dL — ABNORMAL HIGH (ref 0–99)
LDL/HDL Ratio: 3.8 ratio — ABNORMAL HIGH (ref 0.0–3.2)
Triglycerides: 88 mg/dL (ref 0–149)
VLDL Cholesterol Cal: 16 mg/dL (ref 5–40)

## 2021-01-12 LAB — COMPREHENSIVE METABOLIC PANEL
ALT: 11 IU/L (ref 0–32)
AST: 13 IU/L (ref 0–40)
Albumin/Globulin Ratio: 1.6 (ref 1.2–2.2)
Albumin: 4.4 g/dL (ref 3.8–4.9)
Alkaline Phosphatase: 57 IU/L (ref 44–121)
BUN/Creatinine Ratio: 14 (ref 9–23)
BUN: 9 mg/dL (ref 6–24)
Bilirubin Total: 0.3 mg/dL (ref 0.0–1.2)
CO2: 22 mmol/L (ref 20–29)
Calcium: 9.8 mg/dL (ref 8.7–10.2)
Chloride: 105 mmol/L (ref 96–106)
Creatinine, Ser: 0.66 mg/dL (ref 0.57–1.00)
Globulin, Total: 2.8 g/dL (ref 1.5–4.5)
Glucose: 109 mg/dL — ABNORMAL HIGH (ref 65–99)
Potassium: 4.4 mmol/L (ref 3.5–5.2)
Sodium: 143 mmol/L (ref 134–144)
Total Protein: 7.2 g/dL (ref 6.0–8.5)
eGFR: 102 mL/min/{1.73_m2} (ref >59)

## 2021-01-12 LAB — HEMOGLOBIN A1C
Est. average glucose Bld gHb Est-mCnc: 128 mg/dL
Hgb A1c MFr Bld: 6.1 % — ABNORMAL HIGH (ref 4.8–5.6)

## 2021-01-12 LAB — TSH: TSH: 1.72 u[IU]/mL (ref 0.450–4.500)

## 2021-01-15 ENCOUNTER — Other Ambulatory Visit: Payer: Self-pay | Admitting: Family Medicine

## 2021-01-15 DIAGNOSIS — I10 Essential (primary) hypertension: Secondary | ICD-10-CM

## 2021-01-15 DIAGNOSIS — K219 Gastro-esophageal reflux disease without esophagitis: Secondary | ICD-10-CM

## 2021-01-18 ENCOUNTER — Other Ambulatory Visit: Payer: Self-pay | Admitting: Gerontology

## 2021-01-18 ENCOUNTER — Other Ambulatory Visit: Payer: Self-pay

## 2021-01-18 ENCOUNTER — Ambulatory Visit: Payer: Self-pay | Admitting: Gerontology

## 2021-01-18 DIAGNOSIS — I25119 Atherosclerotic heart disease of native coronary artery with unspecified angina pectoris: Secondary | ICD-10-CM

## 2021-01-18 DIAGNOSIS — K219 Gastro-esophageal reflux disease without esophagitis: Secondary | ICD-10-CM

## 2021-01-18 DIAGNOSIS — E785 Hyperlipidemia, unspecified: Secondary | ICD-10-CM

## 2021-01-18 DIAGNOSIS — I10 Essential (primary) hypertension: Secondary | ICD-10-CM

## 2021-01-18 DIAGNOSIS — E119 Type 2 diabetes mellitus without complications: Secondary | ICD-10-CM

## 2021-01-18 MED ORDER — FAMOTIDINE 20 MG PO TABS
20.0000 mg | ORAL_TABLET | Freq: Two times a day (BID) | ORAL | 0 refills | Status: DC | PRN
Start: 2021-01-18 — End: 2021-01-23

## 2021-01-18 MED ORDER — METOPROLOL TARTRATE 50 MG PO TABS: 50.0000 mg | ORAL_TABLET | Freq: Every day | ORAL | 1 refills | Status: DC

## 2021-01-18 MED ORDER — ATORVASTATIN CALCIUM 40 MG PO TABS: 40.0000 mg | ORAL_TABLET | Freq: Every day | ORAL | 1 refills | Status: DC

## 2021-01-18 MED ORDER — CLOPIDOGREL BISULFATE 75 MG PO TABS: 75.0000 mg | ORAL_TABLET | Freq: Every day | ORAL | 0 refills | Status: DC

## 2021-01-18 MED ORDER — GLIPIZIDE 10 MG PO TABS: 20.0000 mg | ORAL_TABLET | Freq: Every day | ORAL | 0 refills | Status: DC

## 2021-01-18 MED ORDER — LISINOPRIL 40 MG PO TABS: 40.0000 mg | ORAL_TABLET | Freq: Every day | ORAL | 0 refills | Status: DC

## 2021-01-18 MED ORDER — METFORMIN HCL 1000 MG PO TABS: 1500.0000 mg | ORAL_TABLET | Freq: Every day | ORAL | 0 refills | Status: DC

## 2021-01-18 MED ORDER — ISOSORBIDE MONONITRATE ER 30 MG PO TB24: 30.0000 mg | ORAL_TABLET | Freq: Every day | ORAL | 0 refills | Status: DC

## 2021-01-18 NOTE — Progress Notes (Signed)
[facility]   Progress Note: General Provider: Regino Bellow, NP  SUBJECTIVE:   Laurie Kirby is a 60 y.o. female who  has a past medical history of Diabetes mellitus without complication (HCC), Hyperlipidemia, and Hypertension. The patient presents today for a medication refill. She states that she stopped taking morning dose metoprolol two months ago, making her feel bad. She states that she forgets to take her medicines about twice a week. She has a blood pressure machine, but she doesn't check her BP at home. Her blood pressure during this visit was 156/81, and her repeat blood pressure was 146/87. She endorses taking all blood pressure medications at night. She checks her blood glucose twice a week, and she states that her average fasting blood glucose is 150 mg/dl. Labs done on  01/11/21 HgA1c was 6.1%  improved from 7.5%, TSH 1.720, total cholesterol 202, HDL 39 a reduction from 49 mg/dl, and LDL 629. She states that she doesn't follow the DASH diet; she mainly consumes fried foods and pork chops. She doesn't exercise, and doesn't plan to change her diet. She denies chest pain, palpitation, headaches, peripheral neuropathy, hyper or hypoglycemia symptoms. She has no other complaints. Overall, she states that she's doing well and offers no further complaint.   Review of Systems  Constitutional: Negative.   HENT: Negative.   Eyes: Negative.   Respiratory: Negative.   Cardiovascular: Negative.   Gastrointestinal: Negative.   Genitourinary: Negative.   Musculoskeletal: Negative.   Skin: Negative.   Endo/Heme/Allergies: Negative.   Psychiatric/Behavioral: Negative.      OBJECTIVE: BP (!) 146/87 (BP Location: Left Arm, Patient Position: Sitting, Cuff Size: Normal)   Pulse 92   Temp 98.6 F (37 C)   Ht 5' 4.5" (1.638 m)   Wt 129 lb 9.6 oz (58.8 kg)   SpO2 98%   BMI 21.90 kg/m   Wt Readings from Last 3 Encounters:  01/18/21 129 lb 9.6 oz (58.8 kg)  10/12/20 133  lb 4.8 oz (60.5 kg)  09/05/20 146 lb (66.2 kg)     Physical Exam Vitals reviewed.  Constitutional:      Appearance: Normal appearance.  HENT:     Head: Normocephalic.  Cardiovascular:     Rate and Rhythm: Normal rate and regular rhythm.     Pulses: Normal pulses.     Heart sounds: Normal heart sounds.  Pulmonary:     Effort: Pulmonary effort is normal.     Breath sounds: Normal breath sounds.  Abdominal:     General: Abdomen is flat. Bowel sounds are normal.     Palpations: Abdomen is soft.  Musculoskeletal:        General: Normal range of motion.  Skin:    General: Skin is warm and dry.     Capillary Refill: Capillary refill takes less than 2 seconds.  Neurological:     General: No focal deficit present.     Mental Status: She is alert and oriented to person, place, and time.  Psychiatric:        Mood and Affect: Mood normal.        Behavior: Behavior normal.     ASSESSMENT/PLAN:  1. Gastroesophageal reflux disease without esophagitis - famotidine (PEPCID) 20 MG tablet; Take 1 tablet (20 mg total) by mouth 2 (two) times daily as needed for heartburn or indigestion.  Dispense: 180 tablet; Refill: 0  2. Essential hypertension - Your blood pressure is not controlled,start taking lisinopril in am. -Continue taking  your current medications as directed. -Advised to check blood pressure 3 times a week at the same time and to bring log with next appointment -Advised to follow DASH diet and daily exercise as tolerated. - lisinopril (ZESTRIL) 40 MG tablet; Take 1 tablet (40 mg total) by mouth daily. Please take this medication in the morning  Dispense: 90 tablet; Refill: 0 - metoprolol tartrate (LOPRESSOR) 50 MG tablet; Take 1 tablet (50 mg total) by mouth daily.  Dispense: 90 tablet; Refill: 1  3. Diabetes mellitus without complication (HCC) -Your HgbA1c was 6.1% -No medication changes warranted at the present time. -Advised to check blood glucose and to bring log to the  next appointment. Fasting blood glucose-should be between 80 mg/dl to 130mg /dl.  -He is advised to continue on low carb/a low concentrated sweets diet and exercise as tolerated. He was advise to do daily foot checks. - glipiZIDE (GLUCOTROL) 10 MG tablet; Take 2 tablets (20 mg total) by mouth daily before breakfast.  Dispense: 90 tablet; Refill: 0 - metFORMIN (GLUCOPHAGE) 1000 MG tablet; Take 1.5 tablets (1,500 mg total) by mouth daily with breakfast.  Dispense: 135 tablet; Refill: 0  4. Hyperlipidemia, unspecified hyperlipidemia type - Advised to take medication as directed. -Advised to follow DASH diet and regular exercise as tolerated. -atorvastatin (LIPITOR) 40 MG tablet; take 1 tablet (40 mg total) by mouth daily.  Dispense: 90 tablet; Refill: 1 - clopidogrel (PLAVIX) 75 MG tablet; Take 1 tablet (75 mg total) by mouth daily.  Dispense: 90 tablet; Refill: 0  5. 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  Dispense: 90 tablet; Refill: 0 - isosorbide mononitrate (IMDUR) 30 MG 24 hr tablet; Take 1 tablet (30 mg total) by mouth daily.  Dispense: 90 tablet; Refill: 0   Return in about 4 weeks (around 02/15/2021), or if symptoms worsen or fail to improve, for blood pressure monitor.    The patient was given clear instructions to go to ER or return to medical center if symptoms do not improve, worsen or new problems develop. The patient verbalized understanding and agreed with plan of care.  Adal Sereno, AGNP [facility]

## 2021-01-18 NOTE — Patient Instructions (Signed)
Diabetes Mellitus Action Plan Following a diabetes action plan is a way for you to manage your diabetes (diabetes mellitus) symptoms. The plan is color-coded to help you understand what actions you need to take based on any symptoms you are having.  If you have symptoms in the red zone, you need medical care right away.  If you have symptoms in the yellow zone, you are having problems.  If you have symptoms in the green zone, you are doing well. Learning about and understanding diabetes can take time. Follow the plan that you develop with your health care provider. Know the target range for your blood sugar (glucose) level, and review your treatment plan with your health care provider at each visit. The target range for my blood sugar level is __________________________ mg/dL. Red zone Get medical help right away if you have any of the following symptoms:  A blood sugar test result that is below 54 mg/dL (3 mmol/L).  A blood sugar test result that is at or above 240 mg/dL (13.3 mmol/L) for 2 days in a row.  Confusion or trouble thinking clearly.  Difficulty breathing.  Sickness or a fever for 2 or more days that is not getting better.  Moderate or large ketone levels in your urine.  Feeling tired or having no energy. If you have any red zone symptoms, do not wait to see if the symptoms will go away. Get medical help right away. Call your local emergency services (911 in the [place] Do not drive yourself to the hospital. If you have severely low blood sugar (severe hypoglycemia) and you cannot eat or drink, you may need glucagon. Make sure a family member or close friend knows how to check your blood sugar and how to give you glucagon. You may need to be treated in a hospital for this condition.   Yellow zone If you have any of the following symptoms, your diabetes is not under control and you may need to make some changes:  A blood sugar test result that is at or above 240 mg/dL (13.3  mmol/L) for 2 days in a row.  Blood sugar test results that are below 70 mg/dL (3.9 mmol/L).  Other symptoms of hypoglycemia, such as: ? Shaking or feeling light-headed. ? Confusion or irritability. ? Feeling hungry. ? Having a fast heartbeat. If you have any yellow zone symptoms:  Treat your hypoglycemia by eating or drinking 15 grams of a rapid-acting carbohydrate. Follow the 15:15 rule: ? Take 15 grams of a rapid-acting carbohydrate, such as:  1 tube of glucose gel.  4 glucose pills.  4 oz (120 mL) of fruit juice.  4 oz (120 mL) of regular (not diet) soda. ? Check your blood sugar 15 minutes after you take the carbohydrate. ? If the repeat blood sugar test is still at or below 70 mg/dL (3.9 mmol/L), take 15 grams of a carbohydrate again. ? If your blood sugar does not increase above 70 mg/dL (3.9 mmol/L) after 3 tries, get medical help right away. ? After your blood sugar returns to normal, eat a meal or a snack within 1 hour.  Keep taking your daily medicines as told by your health care provider.  Check your blood sugar more often than you normally would. ? Write down your results. ? Call your health care provider if you have trouble keeping your blood sugar in your target range.   Green zone These signs mean you are doing well and you can continue what you   are doing to manage your diabetes:  Your blood sugar is within your personal target range. For most people, a blood sugar level before a meal (preprandial) should be 80-130 mg/dL (1.2-4.5 mmol/L).  You feel well, and you are able to do daily activities. If you are in the green zone, continue to manage your diabetes as told by your health care provider. To do this:  Eat a healthy diet.  Exercise regularly.  Check your blood sugar as told by your health care provider.  Take your medicines as told by your health care provider.   Where to find more information  American Diabetes Association (ADA):  diabetes.org  Association of Diabetes Care & Education Specialists (ADCES): diabeteseducator.org Summary  Following a diabetes action plan is a way for you to manage your diabetes symptoms. The plan is color-coded to help you understand what actions you need to take based on any symptoms you are having.  Follow the plan that you develop with your health care provider. Make sure you know your personal target blood sugar level.  Review your treatment plan with your health care provider at each visit. This information is not intended to replace advice given to you by your health care provider. Make sure you discuss any questions you have with your health care provider. Document Revised: 04/20/2020 Document Reviewed: 04/20/2020 Elsevier Patient Education  2021 Elsevier Inc. https://www.mata.com/.pdf">  DASH Eating Plan DASH stands for Dietary Approaches to Stop Hypertension. The DASH eating plan is a healthy eating plan that has been shown to:  Reduce high blood pressure (hypertension).  Reduce your risk for type 2 diabetes, heart disease, and stroke.  Help with weight loss. What are tips for following this plan? Reading food labels  Check food labels for the amount of salt (sodium) per serving. Choose foods with less than 5 percent of the Daily Value of sodium. Generally, foods with less than 300 milligrams (mg) of sodium per serving fit into this eating plan.  To find whole grains, look for the word "whole" as the first word in the ingredient list. Shopping  Buy products labeled as "low-sodium" or "no salt added."  Buy fresh foods. Avoid canned foods and pre-made or frozen meals. Cooking  Avoid adding salt when cooking. Use salt-free seasonings or herbs instead of table salt or sea salt. Check with your health care provider or pharmacist before using salt substitutes.  Do not fry foods. Cook foods using healthy methods such as baking,  boiling, grilling, roasting, and broiling instead.  Cook with heart-healthy oils, such as olive, canola, avocado, soybean, or sunflower oil. Meal planning  Eat a balanced diet that includes: ? 4 or more servings of fruits and 4 or more servings of vegetables each day. Try to fill one-half of your plate with fruits and vegetables. ? 6-8 servings of whole grains each day. ? Less than 6 oz (170 g) of lean meat, poultry, or fish each day. A 3-oz (85-g) serving of meat is about the same size as a deck of cards. One egg equals 1 oz (28 g). ? 2-3 servings of low-fat dairy each day. One serving is 1 cup (237 mL). ? 1 serving of nuts, seeds, or beans 5 times each week. ? 2-3 servings of heart-healthy fats. Healthy fats called omega-3 fatty acids are found in foods such as walnuts, flaxseeds, fortified milks, and eggs. These fats are also found in cold-water fish, such as sardines, salmon, and mackerel.  Limit how much you eat of: ?  Canned or prepackaged foods. ? Food that is high in trans fat, such as some fried foods. ? Food that is high in saturated fat, such as fatty meat. ? Desserts and other sweets, sugary drinks, and other foods with added sugar. ? Full-fat dairy products.  Do not salt foods before eating.  Do not eat more than 4 egg yolks a week.  Try to eat at least 2 vegetarian meals a week.  Eat more home-cooked food and less restaurant, buffet, and fast food.   Lifestyle  When eating at a restaurant, ask that your food be prepared with less salt or no salt, if possible.  If you drink alcohol: ? Limit how much you use to:  0-1 drink a day for women who are not pregnant.  0-2 drinks a day for men. ? Be aware of how much alcohol is in your drink. In the [place] one drink equals one 12 oz bottle of beer (355 mL), one 5 oz glass of wine (148 mL), or one 1 oz glass of hard liquor (44 mL). General information  Avoid eating more than 2,300 mg of salt a day. If you have hypertension,  you may need to reduce your sodium intake to 1,500 mg a day.  Work with your health care provider to maintain a healthy body weight or to lose weight. Ask what an ideal weight is for you.  Get at least 30 minutes of exercise that causes your heart to beat faster (aerobic exercise) most days of the week. Activities may include walking, swimming, or biking.  Work with your health care provider or dietitian to adjust your eating plan to your individual calorie needs. What foods should I eat? Fruits All fresh, dried, or frozen fruit. Canned fruit in natural juice (without added sugar). Vegetables Fresh or frozen vegetables (raw, steamed, roasted, or grilled). Low-sodium or reduced-sodium tomato and vegetable juice. Low-sodium or reduced-sodium tomato sauce and tomato paste. Low-sodium or reduced-sodium canned vegetables. Grains Whole-grain or whole-wheat bread. Whole-grain or whole-wheat pasta. Brown rice. Orpah Cobb. Bulgur. Whole-grain and low-sodium cereals. Pita bread. Low-fat, low-sodium crackers. Whole-wheat flour tortillas. Meats and other proteins Skinless chicken or [place]. Ground chicken or [place]. Pork with fat trimmed off. Fish and seafood. Egg whites. Dried beans, peas, or lentils. Unsalted nuts, nut butters, and seeds. Unsalted canned beans. Lean cuts of beef with fat trimmed off. Low-sodium, lean precooked or cured meat, such as sausages or meat loaves. Dairy Low-fat (1%) or fat-free (skim) milk. Reduced-fat, low-fat, or fat-free cheeses. Nonfat, low-sodium ricotta or cottage cheese. Low-fat or nonfat yogurt. Low-fat, low-sodium cheese. Fats and oils Soft margarine without trans fats. Vegetable oil. Reduced-fat, low-fat, or light mayonnaise and salad dressings (reduced-sodium). Canola, safflower, olive, avocado, soybean, and sunflower oils. Avocado. Seasonings and condiments Herbs. Spices. Seasoning mixes without salt. Other foods Unsalted popcorn and pretzels. Fat-free  sweets. The items listed above may not be a complete list of foods and beverages you can eat. Contact a dietitian for more information. What foods should I avoid? Fruits Canned fruit in a light or heavy syrup. Fried fruit. Fruit in cream or butter sauce. Vegetables Creamed or fried vegetables. Vegetables in a cheese sauce. Regular canned vegetables (not low-sodium or reduced-sodium). Regular canned tomato sauce and paste (not low-sodium or reduced-sodium). Regular tomato and vegetable juice (not low-sodium or reduced-sodium). Rosita Fire. Olives. Grains Baked goods made with fat, such as croissants, muffins, or some breads. Dry pasta or rice meal packs. Meats and other proteins Fatty cuts of meat.  Ribs. Foy [place] meat. Tomasa Blase. Bologna, salami, and other precooked or cured meats, such as sausages or meat loaves. Fat from the back of a pig (fatback). Bratwurst. Salted nuts and seeds. Canned beans with added salt. Canned or smoked fish. Whole eggs or egg yolks. Chicken or [place] with skin. Dairy Whole or 2% milk, cream, and half-and-half. Whole or full-fat cream cheese. Whole-fat or sweetened yogurt. Full-fat cheese. Nondairy creamers. Whipped toppings. Processed cheese and cheese spreads. Fats and oils Butter. Stick margarine. Lard. Shortening. Ghee. Bacon fat. Tropical oils, such as coconut, palm kernel, or palm oil. Seasonings and condiments Onion salt, garlic salt, seasoned salt, table salt, and sea salt. Worcestershire sauce. Tartar sauce. Barbecue sauce. Teriyaki sauce. Soy sauce, including reduced-sodium. Steak sauce. Canned and packaged gravies. Fish sauce. Oyster sauce. Cocktail sauce. Store-bought horseradish. Ketchup. Mustard. Meat flavorings and tenderizers. Bouillon cubes. Hot sauces. Pre-made or packaged marinades. Pre-made or packaged taco seasonings. Relishes. Regular salad dressings. Other foods Salted popcorn and pretzels. The items listed above may not be a complete list of foods and  beverages you should avoid. Contact a dietitian for more information. Where to find more information  National Heart, Lung, and Blood Institute: PopSteam.is  American Heart Association: www.heart.org  Academy of Nutrition and Dietetics: www.eatright.org  National Kidney Foundation: www.kidney.org Summary  The DASH eating plan is a healthy eating plan that has been shown to reduce high blood pressure (hypertension). It may also reduce your risk for type 2 diabetes, heart disease, and stroke.  When on the DASH eating plan, aim to eat more fresh fruits and vegetables, whole grains, lean proteins, low-fat dairy, and heart-healthy fats.  With the DASH eating plan, you should limit salt (sodium) intake to 2,300 mg a day. If you have hypertension, you may need to reduce your sodium intake to 1,500 mg a day.  Work with your health care provider or dietitian to adjust your eating plan to your individual calorie needs. This information is not intended to replace advice given to you by your health care provider. Make sure you discuss any questions you have with your health care provider. Document Revised: 09/17/2019 Document Reviewed: 09/17/2019 Elsevier Patient Education  2021 [employer].

## 2021-01-19 ENCOUNTER — Other Ambulatory Visit: Payer: Self-pay | Admitting: Gerontology

## 2021-01-19 DIAGNOSIS — I25119 Atherosclerotic heart disease of native coronary artery with unspecified angina pectoris: Secondary | ICD-10-CM

## 2021-01-21 ENCOUNTER — Other Ambulatory Visit: Payer: Self-pay | Admitting: Gerontology

## 2021-01-22 ENCOUNTER — Other Ambulatory Visit: Payer: Self-pay | Admitting: Family Medicine

## 2021-01-22 DIAGNOSIS — I10 Essential (primary) hypertension: Secondary | ICD-10-CM

## 2021-01-23 ENCOUNTER — Telehealth: Payer: Self-pay

## 2021-01-23 ENCOUNTER — Other Ambulatory Visit: Payer: Self-pay | Admitting: Gerontology

## 2021-01-23 DIAGNOSIS — I10 Essential (primary) hypertension: Secondary | ICD-10-CM

## 2021-01-23 DIAGNOSIS — E785 Hyperlipidemia, unspecified: Secondary | ICD-10-CM

## 2021-01-23 DIAGNOSIS — E119 Type 2 diabetes mellitus without complications: Secondary | ICD-10-CM

## 2021-01-23 DIAGNOSIS — I25119 Atherosclerotic heart disease of native coronary artery with unspecified angina pectoris: Secondary | ICD-10-CM

## 2021-01-23 DIAGNOSIS — K219 Gastro-esophageal reflux disease without esophagitis: Secondary | ICD-10-CM

## 2021-01-23 MED ORDER — ISOSORBIDE MONONITRATE ER 30 MG PO TB24: 30.0000 mg | ORAL_TABLET | Freq: Every day | ORAL | 0 refills | Status: DC

## 2021-01-23 MED ORDER — CLOPIDOGREL BISULFATE 75 MG PO TABS: 75.0000 mg | ORAL_TABLET | Freq: Every day | ORAL | 0 refills | Status: DC

## 2021-01-23 MED ORDER — GLIPIZIDE 10 MG PO TABS: 20.0000 mg | ORAL_TABLET | Freq: Every day | ORAL | 0 refills | Status: DC

## 2021-01-23 MED ORDER — LISINOPRIL 40 MG PO TABS: 40.0000 mg | ORAL_TABLET | Freq: Every day | ORAL | 0 refills | Status: DC

## 2021-01-23 MED ORDER — METOPROLOL TARTRATE 50 MG PO TABS: 50.0000 mg | ORAL_TABLET | Freq: Every day | ORAL | 1 refills | Status: DC

## 2021-01-23 MED ORDER — METFORMIN HCL 1000 MG PO TABS: 1500.0000 mg | ORAL_TABLET | Freq: Every day | ORAL | 0 refills | Status: DC

## 2021-01-23 MED ORDER — FAMOTIDINE 20 MG PO TABS: 20.0000 mg | ORAL_TABLET | Freq: Two times a day (BID) | ORAL | 0 refills | Status: DC | PRN

## 2021-01-23 MED ORDER — ATORVASTATIN CALCIUM 40 MG PO TABS: 40.0000 mg | ORAL_TABLET | Freq: Every day | ORAL | 1 refills | Status: DC

## 2021-01-23 NOTE — Telephone Encounter (Signed)
Pt called previously about her Rx not being ready at pharmacy and needs all of them refilled. Checked with provider and all her refills were sent last Thursday so pt needs to check with pharmacy.

## 2021-01-29 ENCOUNTER — Other Ambulatory Visit: Payer: Self-pay

## 2021-01-31 ENCOUNTER — Other Ambulatory Visit: Payer: Self-pay

## 2021-02-01 ENCOUNTER — Other Ambulatory Visit: Payer: Self-pay | Admitting: Gerontology

## 2021-02-01 DIAGNOSIS — Z01818 Encounter for other preprocedural examination: Secondary | ICD-10-CM

## 2021-02-08 ENCOUNTER — Telehealth: Payer: Self-pay

## 2021-02-08 NOTE — Telephone Encounter (Signed)
Spoke with patient to let her know Laurie Kirby referred her for an outpatient EKG and cardiology in order to get medical clearance for dental implant procedure.  Relayed number for EKG scheduling: [phone].    While on phone, patient stated [facility] did not receive medications.  Reviewed chart can confirmed it showed confirmation of receipt from pharmacy for refills sent on 01/23/21 at 2:45pm.  Asked if patient had checked with pharmacy since and she stated she hadn't.  Encouraged her to check with them once more to see if they had received them.  If not, patient has visit on 02/15/21 and plans to address it then.

## 2021-02-13 ENCOUNTER — Other Ambulatory Visit: Payer: Self-pay | Admitting: Family Medicine

## 2021-02-13 DIAGNOSIS — I25119 Atherosclerotic heart disease of native coronary artery with unspecified angina pectoris: Secondary | ICD-10-CM

## 2021-02-15 ENCOUNTER — Other Ambulatory Visit: Payer: Self-pay | Admitting: Gerontology

## 2021-02-15 ENCOUNTER — Other Ambulatory Visit: Payer: Self-pay

## 2021-02-15 ENCOUNTER — Ambulatory Visit: Payer: Self-pay | Admitting: Gerontology

## 2021-02-15 VITALS — BP 156/73 | HR 63 | Temp 98.1°F | Ht 65.0 in | Wt 132.7 lb

## 2021-02-15 DIAGNOSIS — I10 Essential (primary) hypertension: Secondary | ICD-10-CM

## 2021-02-15 DIAGNOSIS — E119 Type 2 diabetes mellitus without complications: Secondary | ICD-10-CM

## 2021-02-15 DIAGNOSIS — E785 Hyperlipidemia, unspecified: Secondary | ICD-10-CM

## 2021-02-15 DIAGNOSIS — I25119 Atherosclerotic heart disease of native coronary artery with unspecified angina pectoris: Secondary | ICD-10-CM

## 2021-02-15 DIAGNOSIS — Z0000 Encounter for general adult medical examination without abnormal findings: Secondary | ICD-10-CM

## 2021-02-15 MED ORDER — ATORVASTATIN CALCIUM 40 MG PO TABS: 80.0000 mg | ORAL_TABLET | Freq: Every day | ORAL | 1 refills | Status: DC

## 2021-02-15 MED ORDER — METOPROLOL TARTRATE 100 MG PO TABS: 100.0000 mg | ORAL_TABLET | Freq: Every day | ORAL | 0 refills | Status: DC

## 2021-02-15 NOTE — Patient Instructions (Signed)
Heart-Healthy Eating Plan Heart-healthy meal planning includes:  Eating less unhealthy fats.  Eating more healthy fats.  Making other changes in your diet. Talk with your doctor or a diet specialist (dietitian) to create an eating plan that is right for you. What is my plan? Your doctor may recommend an eating plan that includes:  Total fat: ______% or less of total calories a day.  Saturated fat: ______% or less of total calories a day.  Cholesterol: less than _________mg a day. What are tips for following this plan? Cooking Avoid frying your food. Try to bake, boil, grill, or broil it instead. You can also reduce fat by:  Removing the skin from poultry.  Removing all visible fats from meats.  Steaming vegetables in water or broth. Meal planning  At meals, divide your plate into four equal parts: ? Fill one-half of your plate with vegetables and green salads. ? Fill one-fourth of your plate with whole grains. ? Fill one-fourth of your plate with lean protein foods.  Eat 4-5 servings of vegetables per day. A serving of vegetables is: ? 1 cup of raw or cooked vegetables. ? 2 cups of raw leafy greens.  Eat 4-5 servings of fruit per day. A serving of fruit is: ? 1 medium whole fruit. ?  cup of dried fruit. ?  cup of fresh, frozen, or canned fruit. ?  cup of 100% fruit juice.  Eat more foods that have soluble fiber. These are apples, broccoli, carrots, beans, peas, and barley. Try to get 20-30 g of fiber per day.  Eat 4-5 servings of nuts, legumes, and seeds per week: ? 1 serving of dried beans or legumes equals  cup after being cooked. ? 1 serving of nuts is  cup. ? 1 serving of seeds equals 1 tablespoon.   General information  Eat more home-cooked food. Eat less restaurant, buffet, and fast food.  Limit or avoid alcohol.  Limit foods that are high in starch and sugar.  Avoid fried foods.  Lose weight if you are overweight.  Keep track of how much salt  (sodium) you eat. This is important if you have high blood pressure. Ask your doctor to tell you more about this.  Try to add vegetarian meals each week. Fats  Choose healthy fats. These include olive oil and canola oil, flaxseeds, walnuts, almonds, and seeds.  Eat more omega-3 fats. These include salmon, mackerel, sardines, tuna, flaxseed oil, and ground flaxseeds. Try to eat fish at least 2 times each week.  Check food labels. Avoid foods with trans fats or high amounts of saturated fat.  Limit saturated fats. ? These are often found in animal products, such as meats, butter, and cream. ? These are also found in plant foods, such as palm oil, palm kernel oil, and coconut oil.  Avoid foods with partially hydrogenated oils in them. These have trans fats. Examples are stick margarine, some tub margarines, cookies, crackers, and other baked goods. What foods can I eat? Fruits All fresh, canned (in natural juice), or frozen fruits. Vegetables Fresh or frozen vegetables (raw, steamed, roasted, or grilled). Green salads. Grains Most grains. Choose whole wheat and whole grains most of the time. Rice and pasta, including brown rice and pastas made with whole wheat. Meats and other proteins Lean, well-trimmed beef, veal, pork, and lamb. Chicken and turkey without skin. All fish and shellfish. Wild duck, rabbit, pheasant, and venison. Egg whites or low-cholesterol egg substitutes. Dried beans, peas, lentils, and tofu. Seeds and   most nuts. Dairy Low-fat or nonfat cheeses, including ricotta and mozzarella. Skim or 1% milk that is liquid, powdered, or evaporated. Buttermilk that is made with low-fat milk. Nonfat or low-fat yogurt. Fats and oils Non-hydrogenated (trans-free) margarines. Vegetable oils, including soybean, sesame, sunflower, olive, peanut, safflower, corn, canola, and cottonseed. Salad dressings or mayonnaise made with a vegetable oil. Beverages Mineral water. Coffee and tea. Diet  carbonated beverages. Sweets and desserts Sherbet, gelatin, and fruit ice. Small amounts of dark chocolate. Limit all sweets and desserts. Seasonings and condiments All seasonings and condiments. The items listed above may not be a complete list of foods and drinks you can eat. Contact a dietitian for more options. What foods should I avoid? Fruits Canned fruit in heavy syrup. Fruit in cream or butter sauce. Fried fruit. Limit coconut. Vegetables Vegetables cooked in cheese, cream, or butter sauce. Fried vegetables. Grains Breads that are made with saturated or trans fats, oils, or whole milk. Croissants. Sweet rolls. Donuts. High-fat crackers, such as cheese crackers. Meats and other proteins Fatty meats, such as hot dogs, ribs, sausage, bacon, rib-eye roast or steak. High-fat deli meats, such as salami and bologna. Caviar. Domestic duck and goose. Organ meats, such as liver. Dairy Cream, sour cream, cream cheese, and creamed cottage cheese. Whole-milk cheeses. Whole or 2% milk that is liquid, evaporated, or condensed. Whole buttermilk. Cream sauce or high-fat cheese sauce. Yogurt that is made from whole milk. Fats and oils Meat fat, or shortening. Cocoa butter, hydrogenated oils, palm oil, coconut oil, palm kernel oil. Solid fats and shortenings, including bacon fat, salt pork, lard, and butter. Nondairy cream substitutes. Salad dressings with cheese or sour cream. Beverages Regular sodas and juice drinks with added sugar. Sweets and desserts Frosting. Pudding. Cookies. Cakes. Pies. Milk chocolate or Poblano chocolate. Buttered syrups. Full-fat ice cream or ice cream drinks. The items listed above may not be a complete list of foods and drinks to avoid. Contact a dietitian for more information. Summary  Heart-healthy meal planning includes eating less unhealthy fats, eating more healthy fats, and making other changes in your diet.  Eat a balanced diet. This includes fruits and  vegetables, low-fat or nonfat dairy, lean protein, nuts and legumes, whole grains, and heart-healthy oils and fats. This information is not intended to replace advice given to you by your health care provider. Make sure you discuss any questions you have with your health care provider. Document Revised: 12/18/2017 Document Reviewed: 11/21/2017 Elsevier Patient Education  2021 Elsevier Inc. https://www.nhlbi.nih.gov/files/docs/public/heart/dash_brief.pdf">  DASH Eating Plan DASH stands for Dietary Approaches to Stop Hypertension. The DASH eating plan is a healthy eating plan that has been shown to:  Reduce high blood pressure (hypertension).  Reduce your risk for type 2 diabetes, heart disease, and stroke.  Help with weight loss. What are tips for following this plan? Reading food labels  Check food labels for the amount of salt (sodium) per serving. Choose foods with less than 5 percent of the Daily Value of sodium. Generally, foods with less than 300 milligrams (mg) of sodium per serving fit into this eating plan.  To find whole grains, look for the word "whole" as the first word in the ingredient list. Shopping  Buy products labeled as "low-sodium" or "no salt added."  Buy fresh foods. Avoid canned foods and pre-made or frozen meals. Cooking  Avoid adding salt when cooking. Use salt-free seasonings or herbs instead of table salt or sea salt. Check with your health care provider or pharmacist before   using salt substitutes.  Do not fry foods. Cook foods using healthy methods such as baking, boiling, grilling, roasting, and broiling instead.  Cook with heart-healthy oils, such as olive, canola, avocado, soybean, or sunflower oil. Meal planning  Eat a balanced diet that includes: ? 4 or more servings of fruits and 4 or more servings of vegetables each day. Try to fill one-half of your plate with fruits and vegetables. ? 6-8 servings of whole grains each day. ? Less than 6 oz (170  g) of lean meat, poultry, or fish each day. A 3-oz (85-g) serving of meat is about the same size as a deck of cards. One egg equals 1 oz (28 g). ? 2-3 servings of low-fat dairy each day. One serving is 1 cup (237 mL). ? 1 serving of nuts, seeds, or beans 5 times each week. ? 2-3 servings of heart-healthy fats. Healthy fats called omega-3 fatty acids are found in foods such as walnuts, flaxseeds, fortified milks, and eggs. These fats are also found in cold-water fish, such as sardines, salmon, and mackerel.  Limit how much you eat of: ? Canned or prepackaged foods. ? Food that is high in trans fat, such as some fried foods. ? Food that is high in saturated fat, such as fatty meat. ? Desserts and other sweets, sugary drinks, and other foods with added sugar. ? Full-fat dairy products.  Do not salt foods before eating.  Do not eat more than 4 egg yolks a week.  Try to eat at least 2 vegetarian meals a week.  Eat more home-cooked food and less restaurant, buffet, and fast food.   Lifestyle  When eating at a restaurant, ask that your food be prepared with less salt or no salt, if possible.  If you drink alcohol: ? Limit how much you use to:  0-1 drink a day for women who are not pregnant.  0-2 drinks a day for men. ? Be aware of how much alcohol is in your drink. In the [place] one drink equals one 12 oz bottle of beer (355 mL), one 5 oz glass of wine (148 mL), or one 1 oz glass of hard liquor (44 mL). General information  Avoid eating more than 2,300 mg of salt a day. If you have hypertension, you may need to reduce your sodium intake to 1,500 mg a day.  Work with your health care provider to maintain a healthy body weight or to lose weight. Ask what an ideal weight is for you.  Get at least 30 minutes of exercise that causes your heart to beat faster (aerobic exercise) most days of the week. Activities may include walking, swimming, or biking.  Work with your health care provider or  dietitian to adjust your eating plan to your individual calorie needs. What foods should I eat? Fruits All fresh, dried, or frozen fruit. Canned fruit in natural juice (without added sugar). Vegetables Fresh or frozen vegetables (raw, steamed, roasted, or grilled). Low-sodium or reduced-sodium tomato and vegetable juice. Low-sodium or reduced-sodium tomato sauce and tomato paste. Low-sodium or reduced-sodium canned vegetables. Grains Whole-grain or whole-wheat bread. Whole-grain or whole-wheat pasta. Brown rice. Oatmeal. Quinoa. Bulgur. Whole-grain and low-sodium cereals. Pita bread. Low-fat, low-sodium crackers. Whole-wheat flour tortillas. Meats and other proteins Skinless chicken or turkey. Ground chicken or turkey. Pork with fat trimmed off. Fish and seafood. Egg whites. Dried beans, peas, or lentils. Unsalted nuts, nut butters, and seeds. Unsalted canned beans. Lean cuts of beef with fat trimmed off. Low-sodium,   lean precooked or cured meat, such as sausages or meat loaves. Dairy Low-fat (1%) or fat-free (skim) milk. Reduced-fat, low-fat, or fat-free cheeses. Nonfat, low-sodium ricotta or cottage cheese. Low-fat or nonfat yogurt. Low-fat, low-sodium cheese. Fats and oils Soft margarine without trans fats. Vegetable oil. Reduced-fat, low-fat, or light mayonnaise and salad dressings (reduced-sodium). Canola, safflower, olive, avocado, soybean, and sunflower oils. Avocado. Seasonings and condiments Herbs. Spices. Seasoning mixes without salt. Other foods Unsalted popcorn and pretzels. Fat-free sweets. The items listed above may not be a complete list of foods and beverages you can eat. Contact a dietitian for more information. What foods should I avoid? Fruits Canned fruit in a light or heavy syrup. Fried fruit. Fruit in cream or butter sauce. Vegetables Creamed or fried vegetables. Vegetables in a cheese sauce. Regular canned vegetables (not low-sodium or reduced-sodium). Regular canned  tomato sauce and paste (not low-sodium or reduced-sodium). Regular tomato and vegetable juice (not low-sodium or reduced-sodium). Pickles. Olives. Grains Baked goods made with fat, such as croissants, muffins, or some breads. Dry pasta or rice meal packs. Meats and other proteins Fatty cuts of meat. Ribs. Fried meat. Bacon. Bologna, salami, and other precooked or cured meats, such as sausages or meat loaves. Fat from the back of a pig (fatback). Bratwurst. Salted nuts and seeds. Canned beans with added salt. Canned or smoked fish. Whole eggs or egg yolks. Chicken or turkey with skin. Dairy Whole or 2% milk, cream, and half-and-half. Whole or full-fat cream cheese. Whole-fat or sweetened yogurt. Full-fat cheese. Nondairy creamers. Whipped toppings. Processed cheese and cheese spreads. Fats and oils Butter. Stick margarine. Lard. Shortening. Ghee. Bacon fat. Tropical oils, such as coconut, palm kernel, or palm oil. Seasonings and condiments Onion salt, garlic salt, seasoned salt, table salt, and sea salt. Worcestershire sauce. Tartar sauce. Barbecue sauce. Teriyaki sauce. Soy sauce, including reduced-sodium. Steak sauce. Canned and packaged gravies. Fish sauce. Oyster sauce. Cocktail sauce. Store-bought horseradish. Ketchup. Mustard. Meat flavorings and tenderizers. Bouillon cubes. Hot sauces. Pre-made or packaged marinades. Pre-made or packaged taco seasonings. Relishes. Regular salad dressings. Other foods Salted popcorn and pretzels. The items listed above may not be a complete list of foods and beverages you should avoid. Contact a dietitian for more information. Where to find more information  National Heart, Lung, and Blood Institute: www.nhlbi.nih.gov  American Heart Association: www.heart.org  Academy of Nutrition and Dietetics: www.eatright.org  National Kidney Foundation: www.kidney.org Summary  The DASH eating plan is a healthy eating plan that has been shown to reduce high blood  pressure (hypertension). It may also reduce your risk for type 2 diabetes, heart disease, and stroke.  When on the DASH eating plan, aim to eat more fresh fruits and vegetables, whole grains, lean proteins, low-fat dairy, and heart-healthy fats.  With the DASH eating plan, you should limit salt (sodium) intake to 2,300 mg a day. If you have hypertension, you may need to reduce your sodium intake to 1,500 mg a day.  Work with your health care provider or dietitian to adjust your eating plan to your individual calorie needs. This information is not intended to replace advice given to you by your health care provider. Make sure you discuss any questions you have with your health care provider. Document Revised: 09/17/2019 Document Reviewed: 09/17/2019 Elsevier Patient Education  2021 Elsevier Inc.  

## 2021-02-15 NOTE — Progress Notes (Signed)
Established Patient Office Visit  Subjective:  Patient ID: Laurie Kirby, female    DOB: 12-07-1960  Age: 60 y.o. MRN: [id number]  CC:  Chief Complaint  Patient presents with  . Follow-up    Routine BP check    HPI Laurie Kirby 60 y/o female who has a history of T2DM, Hyperlipidemia, Hypertension and presents for routine follow up of hypertension. She states that she's compliant with her medications and continues to make healthy lifestyle changes. She brought her BP log, her SBP 147-178 and DBP ranges between 72-100. She denies chest pain, palpitation, light headedness, vision changes, hematuria, hematochezia and active bleeding. She had full Dental implant done in November of 2021. Her LDL done on 01/11/21 was 147 mg/dl, she adheres to 40 mg Atorvastatin. Overall, she states that she's doing well and offers no further complaint.  Past Medical History:  Diagnosis Date  . Diabetes mellitus without complication (HCC)   . Hyperlipidemia   . Hypertension     Past Surgical History:  Procedure Laterality Date  . CORONARY ANGIOPLASTY WITH STENT PLACEMENT      Family History  Problem Relation Age of Onset  . Diabetes type II Mother   . Diabetes type II Father   . Heart disease Father   . Cancer Maternal Aunt   . Breast cancer Cousin     Social History   Socioeconomic History  . Marital status: Single    Spouse name: Not on file  . Number of children: 3  . Years of education: Not on file  . Highest education level: High school graduate  Occupational History  . Occupation: unemployed  Tobacco Use  . Smoking status: Former Smoker    Types: Cigarettes    Quit date: 09/18/2020    Years since quitting: 0.4  . Smokeless tobacco: Never Used  . Tobacco comment: quit following having teeth removed  Vaping Use  . Vaping Use: Never used  Substance and Sexual Activity  . Alcohol use: Yes    Alcohol/week: 1.0 standard drink    Types: 1 Glasses of wine per week    Comment:   wine on the weekends  . Drug use: No  . Sexual activity: Yes  Other Topics Concern  . Not on file  Social History Narrative   A friend supports. Not on food stamps. Pretty well taken care of by friend.    Social Determinants of Health   Financial Resource Strain: Not on file  Food Insecurity: Not on file  Transportation Needs: Not on file  Physical Activity: Not on file  Stress: Not on file  Social Connections: Not on file  Intimate Partner Violence: Not on file    Outpatient Medications Prior to Visit  Medication Sig Dispense Refill  . Ascorbic Acid (VITAMIN C) 1000 MG tablet Take 1,000 mg by mouth daily.    . ASPIRIN LOW DOSE 81 MG EC tablet TAKE 1 TABLET BY MOUTH DAILY 90 tablet 0  . clopidogrel (PLAVIX) 75 MG tablet Take 1 tablet (75 mg total) by mouth daily. 90 tablet 0  . famotidine (PEPCID) 20 MG tablet Take 1 tablet (20 mg total) by mouth 2 (two) times daily as needed for heartburn or indigestion. 180 tablet 0  . glipiZIDE (GLUCOTROL) 10 MG tablet Take 2 tablets (20 mg total) by mouth daily before breakfast. 90 tablet 0  . isosorbide mononitrate (IMDUR) 30 MG 24 hr tablet Take 1 tablet (30 mg total) by mouth daily. 90 tablet 0  .  lisinopril (ZESTRIL) 40 MG tablet Take 1 tablet (40 mg total) by mouth daily. Please take this medication in the morning 90 tablet 0  . metFORMIN (GLUCOPHAGE) 1000 MG tablet Take 1.5 tablets (1,500 mg total) by mouth daily with breakfast. 135 tablet 0  . mometasone (ELOCON) 0.1 % ointment Apply topically daily. (Patient not taking: Reported on 02/15/2021) 45 g 0  . atorvastatin (LIPITOR) 40 MG tablet Take 1 tablet (40 mg total) by mouth daily. 90 tablet 1  . metoprolol tartrate (LOPRESSOR) 50 MG tablet Take 1 tablet (50 mg total) by mouth daily. 90 tablet 1   No facility-administered medications prior to visit.    No Known Allergies  ROS Review of Systems  Constitutional: Negative.   Eyes: Negative.   Respiratory: Negative.   Cardiovascular:  Negative.   Neurological: Negative.   Hematological: Negative.       Objective:    Physical Exam HENT:     Head: Normocephalic and atraumatic.  Cardiovascular:     Rate and Rhythm: Normal rate and regular rhythm.     Pulses: Normal pulses.     Heart sounds: Normal heart sounds.  Pulmonary:     Effort: Pulmonary effort is normal.     Breath sounds: Normal breath sounds.  Skin:    General: Skin is warm.  Neurological:     General: No focal deficit present.     Mental Status: She is alert and oriented to person, place, and time. Mental status is at baseline.  Psychiatric:        Mood and Affect: Mood normal.        Thought Content: Thought content normal.        Judgment: Judgment normal.     BP (!) 156/73   Pulse 63   Temp 98.1 F (36.7 C)   Ht 5\' 5"  (1.651 m)   Wt 132 lb 11.2 oz (60.2 kg)   SpO2 96%   BMI 22.08 kg/m  Wt Readings from Last 3 Encounters:  02/15/21 132 lb 11.2 oz (60.2 kg)  01/18/21 129 lb 9.6 oz (58.8 kg)  10/12/20 133 lb 4.8 oz (60.5 kg)     Health Maintenance Due  Topic Date Due  . Hepatitis C Screening  Never done  . HIV Screening  Never done  . TETANUS/TDAP  Never done  . COLONOSCOPY (Pts 45-86yrs Insurance coverage will need to be confirmed)  Never done  . PAP SMEAR-Modifier  10/10/2018  . MAMMOGRAM  10/29/2019  . COVID-19 Vaccine (3 - Booster for Moderna series) 07/24/2020  . OPHTHALMOLOGY EXAM  01/11/2021    There are no preventive care reminders to display for this patient.  Lab Results  Component Value Date   TSH 1.720 01/11/2021   Lab Results  Component Value Date   WBC 15.2 (H) 08/04/2019   HGB 15.4 08/04/2019   HCT 45.4 08/04/2019   MCV 90 08/04/2019   PLT 292 08/04/2019   Lab Results  Component Value Date   NA 143 01/11/2021   K 4.4 01/11/2021   CO2 22 01/11/2021   GLUCOSE 109 (H) 01/11/2021   BUN 9 01/11/2021   CREATININE 0.66 01/11/2021   BILITOT 0.3 01/11/2021   ALKPHOS 57 01/11/2021   AST 13 01/11/2021    ALT 11 01/11/2021   PROT 7.2 01/11/2021   ALBUMIN 4.4 01/11/2021   CALCIUM 9.8 01/11/2021   ANIONGAP 7 10/02/2017   Lab Results  Component Value Date   CHOL 202 (H) 01/11/2021   Lab  Results  Component Value Date   HDL 39 (L) 01/11/2021   Lab Results  Component Value Date   LDLCALC 147 (H) 01/11/2021   Lab Results  Component Value Date   TRIG 88 01/11/2021   Lab Results  Component Value Date   CHOLHDL 4.2 07/05/2020   Lab Results  Component Value Date   HGBA1C 6.1 (H) 01/11/2021      Assessment & Plan:     1. Essential hypertension - Her blood pressure is not under control, her goal should be less than 130/80. Per Dr Windell Hummingbird note of 09/05/20, she's should be taking 50 mg of Metoprolol bid, but was taking 50 mg daily. Her Metoprolol was increased to 100 mg daily, advised to check and record blood pressure and bring log to follow up appointment. She should continue on DASH diet and exercise as tolerated. - metoprolol tartrate (LOPRESSOR) 100 MG tablet; Take 1 tablet (100 mg total) by mouth daily.  Dispense: 90 tablet; Refill: 0   2. Hyperlipidemia, unspecified hyperlipidemia type -The 10-year ASCVD risk score Denman George DC Jr., et al., 2013) is: 30.5%   Values used to calculate the score:     Age: 33 years     Sex: Female     Is Non-Hispanic African American: Yes     Diabetic: Yes     Tobacco smoker: No     Systolic Blood Pressure: 156 mmHg     Is BP treated: Yes     HDL Cholesterol: 39 mg/dL     Total Cholesterol: 202 mg/dL Her ASCVD risk was 99.3%, her Atorvastatin was increased to 80 mg daily, her goal LDL should be less than 70 mg/dl. She was encouraged to continue on low fat/cholesterol diet. - atorvastatin (LIPITOR) 40 MG tablet; Take 2 tablets (80 mg total) by mouth daily.  Dispense: 90 tablet; Refill: 1  3. Health care maintenance - She was provided with [facility] flyer to schedule Mammogram and Pap smear.   Follow-up: Return in about 4 weeks (around  03/15/2021), or if symptoms worsen or fail to improve.    Laurie Prazak Trellis Paganini, NP

## 2021-03-02 ENCOUNTER — Ambulatory Visit: Payer: Self-pay | Admitting: Cardiovascular Disease

## 2021-03-08 NOTE — Progress Notes (Deleted)
Cardiology Office Note  Date:  03/08/2021   ID:  TANAIYA KOLARIK, DOB [birth year], MRN [id number]  PCP:  Rolm Gala, NP   No chief complaint on file.   HPI:  Ms. Laurie Kirby is a 39 to 40 mg daily woman with past medical history of Hypertension Diabetes type 2 Smoker Hyperlipidemia CAD , PCI to RCA   05/2011 Who presents by referral from Dr. Linzie Collin for preop evaluation  Elevated blood pressure on visit July 2021 with primary care started on chlorthalidone 25 at that time  Follow-up visit September 2021 blood pressure 160/78 Lisinopril up to 40 daily  Follow-up visit August 15, 2020 with primary care blood pressure 180/83 Restarted on chlorthalidone  Medication list currently includes isosorbide 30 daily lisinopril 40 daily metoprolol 50 twice daily   chlorthalidone 12.5 daily , reports that she is not taking this  Lab work reviewed Total cholesterol 200 HBA1c 7.5  Prior records reviewed, catheterization August 2020, Records have been requested Stent placed to RCA  Have recurrent chest pain  stress test September 2012 no ischemia  She denies having any angina since that time, no shortness of breath on exertion  Smokes 5 cigarettes a day Medication noncompliance  EKG personally reviewed by myself on todays visit Shows normal sinus rhythm rate 74 bpm T wave abnormality 1, aVL, V3  PMH:   has a past medical history of Diabetes mellitus without complication (HCC), Hyperlipidemia, and Hypertension.  PSH:    Past Surgical History:  Procedure Laterality Date  . CORONARY ANGIOPLASTY WITH STENT PLACEMENT      Current Outpatient Medications  Medication Sig Dispense Refill  . Ascorbic Acid (VITAMIN C) 1000 MG tablet Take 1,000 mg by mouth daily.    . ASPIRIN LOW DOSE 81 MG EC tablet TAKE 1 TABLET BY MOUTH DAILY 90 tablet 0  . atorvastatin (LIPITOR) 40 MG tablet Take 2 tablets (80 mg total) by mouth daily. 90 tablet 1  . clopidogrel (PLAVIX) 75 MG tablet  Take 1 tablet (75 mg total) by mouth daily. 90 tablet 0  . famotidine (PEPCID) 20 MG tablet Take 1 tablet (20 mg total) by mouth 2 (two) times daily as needed for heartburn or indigestion. 180 tablet 0  . glipiZIDE (GLUCOTROL) 10 MG tablet Take 2 tablets (20 mg total) by mouth daily before breakfast. 90 tablet 0  . isosorbide mononitrate (IMDUR) 30 MG 24 hr tablet Take 1 tablet (30 mg total) by mouth daily. 90 tablet 0  . lisinopril (ZESTRIL) 40 MG tablet Take 1 tablet (40 mg total) by mouth daily. Please take this medication in the morning 90 tablet 0  . metFORMIN (GLUCOPHAGE) 1000 MG tablet Take 1.5 tablets (1,500 mg total) by mouth daily with breakfast. 135 tablet 0  . metoprolol tartrate (LOPRESSOR) 100 MG tablet Take 1 tablet (100 mg total) by mouth daily. 90 tablet 0  . mometasone (ELOCON) 0.1 % ointment Apply topically daily. (Patient not taking: Reported on 02/15/2021) 45 g 0   No current facility-administered medications for this visit.     Allergies:   Patient has no known allergies.   Social History:  The patient  reports that she quit smoking about 5 months ago. Her smoking use included cigarettes. She has never used smokeless tobacco. She reports current alcohol use of about 1.0 standard drink of alcohol per week. She reports that she does not use drugs.   Family History:   family history includes Breast cancer in her cousin; Cancer in her  maternal aunt; Diabetes type II in her father and mother; Heart disease in her father.    Review of Systems: Review of Systems  Constitutional: Negative.   HENT: Negative.   Respiratory: Negative.   Cardiovascular: Negative.   Gastrointestinal: Negative.   Musculoskeletal: Negative.   Neurological: Negative.   Psychiatric/Behavioral: Negative.   All other systems reviewed and are negative.   PHYSICAL EXAM: VS:  There were no vitals taken for this visit. , BMI There is no height or weight on file to calculate BMI. GEN: Well nourished,  well developed, in no acute distress HEENT: normal Neck: no JVD, carotid bruits, or masses Cardiac: RRR; no murmurs, rubs, or gallops,no edema  Respiratory:  clear to auscultation bilaterally, normal work of breathing GI: soft, nontender, nondistended, + BS MS: no deformity or atrophy Skin: warm and dry, no rash Neuro:  Strength and sensation are intact Psych: euthymic mood, full affect   Recent Labs: 01/11/2021: ALT 11; BUN 9; Creatinine, Ser 0.66; Potassium 4.4; Sodium 143; TSH 1.720    Lipid Panel Lab Results  Component Value Date   CHOL 202 (H) 01/11/2021   HDL 39 (L) 01/11/2021   LDLCALC 147 (H) 01/11/2021   TRIG 88 01/11/2021      Wt Readings from Last 3 Encounters:  02/15/21 132 lb 11.2 oz (60.2 kg)  01/18/21 129 lb 9.6 oz (58.8 kg)  10/12/20 133 lb 4.8 oz (60.5 kg)       ASSESSMENT AND PLAN:  Problem List Items Addressed This Visit   None    Preop cardiovascular evaluation Scheduled to have many of her teeth pulled out late November at dental office Stressed importance of taking her blood pressure occasions including isosorbide 30 daily lisinopril 40 daily and metoprolol 50 twice daily -Given.  Currently she is not taking her medications Almost every doctor visit with poor control blood pressure Recommend she buy a pillbox, she can take some in the morning and evening but must take her pills -Suggested she call us in the next week or so with some blood pressure measurements prior to her dental office visit as she may need further medication titration -If blood pressure runs high would double the isosorbide up to 30 twice daily or 60 daily (this can be titrated further up if needed) Currently not on chlorthalidone per patient.  We will continue with the 3 that she has as I do not think she is taking them  CAD with chronic stable angina Prior stent to the RCA 2012 Continues to smoke, has diabetes, poor control hyperlipidemia Denies anginal symptoms, no  further testing at this time, risk factor modification addressed  Smoker We have encouraged her to continue to work on weaning her cigarettes and smoking cessation. She will continue to work on this and does not want any assistance with chantix.   Hyperlipidemia Stressed importance of compliance with her Lipitor As Lipitor 40 on her list, is not taking it Goal LDL less than 70 preferably 60  Essential hypertension Currently not taking her medications Stressed importance of taking what is listed Discussed the importance and ramifications if she does not take her medications long-term including stroke, heart attack, kidney disease etc. -If additional medication needed with double or triple the isosorbide   Total encounter time more than 45 minutes  Greater than 50% was spent in counseling and coordination of care with the patient      Signed, Dossie Arbour, M.D., Ph.D. [facility] [place], [place] [phone]

## 2021-03-09 ENCOUNTER — Ambulatory Visit: Payer: Self-pay | Admitting: Cardiovascular Disease

## 2021-03-09 ENCOUNTER — Other Ambulatory Visit: Payer: Self-pay

## 2021-03-09 DIAGNOSIS — Z720 Tobacco use: Secondary | ICD-10-CM

## 2021-03-09 DIAGNOSIS — E782 Mixed hyperlipidemia: Secondary | ICD-10-CM

## 2021-03-09 DIAGNOSIS — I10 Essential (primary) hypertension: Secondary | ICD-10-CM

## 2021-03-09 DIAGNOSIS — E119 Type 2 diabetes mellitus without complications: Secondary | ICD-10-CM

## 2021-03-15 ENCOUNTER — Ambulatory Visit: Payer: Self-pay

## 2021-05-03 ENCOUNTER — Other Ambulatory Visit: Payer: Self-pay

## 2021-05-03 ENCOUNTER — Encounter: Payer: Self-pay | Admitting: Obstetrics and Gynecology

## 2021-05-03 ENCOUNTER — Ambulatory Visit: Payer: Self-pay | Admitting: Obstetrics and Gynecology

## 2021-05-03 DIAGNOSIS — I10 Essential (primary) hypertension: Secondary | ICD-10-CM

## 2021-05-03 DIAGNOSIS — I25119 Atherosclerotic heart disease of native coronary artery with unspecified angina pectoris: Secondary | ICD-10-CM

## 2021-05-03 DIAGNOSIS — E785 Hyperlipidemia, unspecified: Secondary | ICD-10-CM

## 2021-05-03 DIAGNOSIS — E119 Type 2 diabetes mellitus without complications: Secondary | ICD-10-CM

## 2021-05-03 MED ORDER — LISINOPRIL 40 MG PO TABS: 40.0000 mg | ORAL_TABLET | Freq: Every day | ORAL | 0 refills | Status: DC

## 2021-05-03 MED ORDER — GLIPIZIDE 10 MG PO TABS: 20.0000 mg | ORAL_TABLET | Freq: Every day | ORAL | 0 refills | Status: DC

## 2021-05-03 MED ORDER — METOPROLOL TARTRATE 100 MG PO TABS: 100.0000 mg | ORAL_TABLET | Freq: Every day | ORAL | 0 refills | Status: DC

## 2021-05-03 MED ORDER — ISOSORBIDE MONONITRATE ER 30 MG PO TB24: 30.0000 mg | ORAL_TABLET | Freq: Every day | ORAL | 0 refills | Status: DC

## 2021-05-03 MED ORDER — CLOPIDOGREL BISULFATE 75 MG PO TABS: 75.0000 mg | ORAL_TABLET | Freq: Every day | ORAL | 0 refills | Status: DC

## 2021-05-03 MED ORDER — METFORMIN HCL 1000 MG PO TABS: 1500.0000 mg | ORAL_TABLET | Freq: Every day | ORAL | 0 refills | Status: DC

## 2021-05-03 NOTE — Progress Notes (Signed)
Laurie Kirby, Laurie Ceo, Laurie Kirby   Chief Complaint  Patient presents with   Follow-up    HPI:      Laurie Kirby is a 60 y.o. No obstetric history on file. whose LMP was No LMP recorded. Patient is postmenopausal., presents today for Rx RF. Pt with HTN, taking meds daily. Metoprolol increased to 100 mg QD from 50 mg BID (but was only taking QD) at last visit. BP at home in 160s/78-86. Hx of type 2 DM, metformin QAM gives diarrhea.  Hx of hyperlipidemia.  Labs due in 3 months.    Past Medical History:  Diagnosis Date   Diabetes mellitus without complication (HCC)    Hyperlipidemia    Hypertension     Past Surgical History:  Procedure Laterality Date   CORONARY ANGIOPLASTY WITH STENT PLACEMENT      Family History  Problem Relation Age of Onset   Diabetes type II Mother    Diabetes type II Father    Heart disease Father    Cancer Maternal Aunt    Breast cancer Cousin     Social History   Socioeconomic History   Marital status: Single    Spouse name: Not on file   Number of children: 3   Years of education: Not on file   Highest education level: High school graduate  Occupational History   Occupation: unemployed  Tobacco Use   Smoking status: Former    Pack years: 0.00    Types: Cigarettes    Quit date: 09/18/2020    Years since quitting: 0.6   Smokeless tobacco: Never   Tobacco comments:    quit following having teeth removed  Vaping Use   Vaping Use: Never used  Substance and Sexual Activity   Alcohol use: Yes    Alcohol/week: 1.0 standard drink    Types: 1 Glasses of wine per week    Comment:  wine on the weekends   Drug use: No   Sexual activity: Yes  Other Topics Concern   Not on file  Social History Narrative   A friend supports. Not on food stamps. Pretty well taken care of by friend.    Social Determinants of Health   Financial Resource Strain: Not on file  Food Insecurity: Not on file  Transportation Needs: Not on file  Physical  Activity: Not on file  Stress: Not on file  Social Connections: Not on file  Intimate Partner Violence: Not on file    Outpatient Medications Prior to Visit  Medication Sig Dispense Refill   Ascorbic Acid (VITAMIN C) 1000 MG tablet Take 1,000 mg by mouth daily.     ASPIRIN LOW DOSE 81 MG EC tablet TAKE 1 TABLET BY MOUTH DAILY 90 tablet 0   atorvastatin (LIPITOR) 40 MG tablet Take 2 tablets (80 mg total) by mouth daily. 90 tablet 1   famotidine (PEPCID) 20 MG tablet Take 1 tablet (20 mg total) by mouth 2 (two) times daily as needed for heartburn or indigestion. 180 tablet 0   clopidogrel (PLAVIX) 75 MG tablet Take 1 tablet (75 mg total) by mouth daily. 90 tablet 0   glipiZIDE (GLUCOTROL) 10 MG tablet Take 2 tablets (20 mg total) by mouth daily before breakfast. 90 tablet 0   metFORMIN (GLUCOPHAGE) 1000 MG tablet Take 1.5 tablets (1,500 mg total) by mouth daily with breakfast. 135 tablet 0   metoprolol tartrate (LOPRESSOR) 100 MG tablet Take 1 tablet (100 mg total) by mouth daily. 90 tablet 0  mometasone (ELOCON) 0.1 % ointment Apply topically daily. (Patient not taking: No sig reported) 45 g 0   isosorbide mononitrate (IMDUR) 30 MG 24 hr tablet Take 1 tablet (30 mg total) by mouth daily. (Patient not taking: Reported on 05/03/2021) 90 tablet 0   lisinopril (ZESTRIL) 40 MG tablet Take 1 tablet (40 mg total) by mouth daily. Please take this medication in the morning 90 tablet 0   No facility-administered medications prior to visit.      ROS:  Review of Systems BREAST: No symptoms   OBJECTIVE:   Vitals:  BP (!) 173/91   Pulse 81   Ht 5\' 3"  (1.6 m)   Wt 145 lb 6.4 oz (66 kg)   SpO2 97%   BMI 25.76 kg/m   Physical Exam   Assessment/Plan: Diabetes mellitus without complication (HCC) - Plan: glipiZIDE (GLUCOTROL) 10 MG tablet, metFORMIN (GLUCOPHAGE) 1000 MG tablet  Type 2 diabetes mellitus without complication, without long-term current use of insulin (HCC) - Plan: metFORMIN  (GLUCOPHAGE) 1000 MG tablet, Hemoglobin A1c  Essential hypertension - Plan: metoprolol tartrate (LOPRESSOR) 100 MG tablet, lisinopril (ZESTRIL) 40 MG tablet, Comprehensive metabolic panel. Changed to 100 mg dose daily instead of 50 mg BID. No change in BP. Pt to keep log and bring to next appt.   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    Dispense:  90 tablet    Refill:  0    Order Specific Question:   Supervising Provider    Answer:   Nadara Mustard   metFORMIN (GLUCOPHAGE) 1000 MG tablet    Sig: Take 1.5 tablets (1,500 mg total) by mouth daily with breakfast.    Dispense:  135 tablet    Refill:  0    Order Specific Question:   Supervising Provider    Answer:   [[id number]] Nadara Mustard   metoprolol tartrate (LOPRESSOR) 100 MG tablet    Sig: Take 1 tablet (100 mg total) by mouth daily.    Dispense:  90 tablet    Refill:  0    Order Specific Question:   Supervising Provider    Answer:   [[id number]] Nadara Mustard   lisinopril (ZESTRIL) 40 MG tablet    Sig: Take 1 tablet (40 mg total) by mouth daily. Please take this medication in the morning    Dispense:  90 tablet    Refill:  0    Order Specific Question:   Supervising Provider    Answer:   [[id number]] Nadara Mustard   clopidogrel (PLAVIX) 75 MG tablet    Sig: Take 1 tablet (75 mg total) by mouth daily.    Dispense:  90 tablet    Refill:  0    Order Specific Question:   Supervising Provider    Answer:   [[id number]] Nadara Mustard   isosorbide mononitrate (IMDUR) 30 MG 24 hr tablet     Sig: Take 1 tablet (30 mg total) by mouth daily.    Dispense:  90 tablet    Refill:  0    Order Specific Question:  Supervising Provider    Answer:   Nadara Mustard [[id number]]   The 10-year ASCVD risk score Denman George DC Montez Hageman., et al., 2013) is: 39.6%   Values used to calculate the score:     Age: 64 years     Sex: Female     Is Non-Hispanic African American: Yes     Diabetic: Yes     Tobacco smoker: No     Systolic Blood Pressure: 173 mmHg     Is BP treated: Yes     HDL Cholesterol: 39 mg/dL     Total Cholesterol: 202 mg/dL   Rx RF. Check labs in 3 months with f/u.     Return in about 3 months (around 08/03/2021) for labs and Rx F/u; HTN/DM f/u.  Laurie Shadix B. Zahlia Deshazer, PA-C 05/03/2021 7:25 PM

## 2021-05-18 ENCOUNTER — Other Ambulatory Visit: Payer: Self-pay

## 2021-06-19 ENCOUNTER — Other Ambulatory Visit: Payer: Self-pay | Admitting: Gerontology

## 2021-06-19 DIAGNOSIS — K219 Gastro-esophageal reflux disease without esophagitis: Secondary | ICD-10-CM

## 2021-06-19 DIAGNOSIS — I25119 Atherosclerotic heart disease of native coronary artery with unspecified angina pectoris: Secondary | ICD-10-CM

## 2021-06-19 DIAGNOSIS — E119 Type 2 diabetes mellitus without complications: Secondary | ICD-10-CM

## 2021-08-02 ENCOUNTER — Other Ambulatory Visit: Payer: Self-pay

## 2021-08-02 DIAGNOSIS — I25119 Atherosclerotic heart disease of native coronary artery with unspecified angina pectoris: Secondary | ICD-10-CM

## 2021-08-02 DIAGNOSIS — E119 Type 2 diabetes mellitus without complications: Secondary | ICD-10-CM

## 2021-08-02 DIAGNOSIS — E785 Hyperlipidemia, unspecified: Secondary | ICD-10-CM

## 2021-08-02 DIAGNOSIS — I10 Essential (primary) hypertension: Secondary | ICD-10-CM

## 2021-08-03 LAB — HEMOGLOBIN A1C
Est. average glucose Bld gHb Est-mCnc: 163 mg/dL
Hgb A1c MFr Bld: 7.3 % — ABNORMAL HIGH (ref 4.8–5.6)

## 2021-08-03 LAB — COMPREHENSIVE METABOLIC PANEL
ALT: 15 IU/L (ref 0–32)
AST: 15 IU/L (ref 0–40)
Albumin/Globulin Ratio: 1.4 (ref 1.2–2.2)
Albumin: 4.5 g/dL (ref 3.8–4.9)
Alkaline Phosphatase: 61 IU/L (ref 44–121)
BUN/Creatinine Ratio: 24 — ABNORMAL HIGH (ref 9–23)
BUN: 16 mg/dL (ref 6–24)
Bilirubin Total: <0.2 mg/dL (ref 0.0–1.2)
CO2: 21 mmol/L (ref 20–29)
Calcium: 9.7 mg/dL (ref 8.7–10.2)
Chloride: 106 mmol/L (ref 96–106)
Creatinine, Ser: 0.67 mg/dL (ref 0.57–1.00)
Globulin, Total: 3.2 g/dL (ref 1.5–4.5)
Glucose: 150 mg/dL — ABNORMAL HIGH (ref 70–99)
Potassium: 4.2 mmol/L (ref 3.5–5.2)
Sodium: 142 mmol/L (ref 134–144)
Total Protein: 7.7 g/dL (ref 6.0–8.5)
eGFR: 101 mL/min/{1.73_m2} (ref >59)

## 2021-08-03 LAB — LIPID PANEL
Chol/HDL Ratio: 4.2 ratio (ref 0.0–4.4)
Cholesterol, Total: 233 mg/dL — ABNORMAL HIGH (ref 100–199)
HDL: 56 mg/dL (ref >39)
LDL Chol Calc (NIH): 146 mg/dL — ABNORMAL HIGH (ref 0–99)
Triglycerides: 175 mg/dL — ABNORMAL HIGH (ref 0–149)
VLDL Cholesterol Cal: 31 mg/dL (ref 5–40)

## 2021-08-09 ENCOUNTER — Other Ambulatory Visit: Payer: Self-pay

## 2021-08-09 ENCOUNTER — Ambulatory Visit: Payer: Self-pay | Admitting: Gerontology

## 2021-08-09 VITALS — BP 162/82 | HR 91 | Temp 98.1°F | Ht 67.0 in | Wt 150.5 lb

## 2021-08-09 DIAGNOSIS — Z0000 Encounter for general adult medical examination without abnormal findings: Secondary | ICD-10-CM

## 2021-08-09 DIAGNOSIS — I25119 Atherosclerotic heart disease of native coronary artery with unspecified angina pectoris: Secondary | ICD-10-CM

## 2021-08-09 DIAGNOSIS — I10 Essential (primary) hypertension: Secondary | ICD-10-CM

## 2021-08-09 DIAGNOSIS — E785 Hyperlipidemia, unspecified: Secondary | ICD-10-CM

## 2021-08-09 DIAGNOSIS — E119 Type 2 diabetes mellitus without complications: Secondary | ICD-10-CM

## 2021-08-09 LAB — GLUCOSE, POCT (MANUAL RESULT ENTRY): POC Glucose: 152 mg/dl — AB (ref 70–99)

## 2021-08-09 MED ORDER — GLIPIZIDE 10 MG PO TABS: ORAL_TABLET | ORAL | 1 refills | Status: DC

## 2021-08-09 MED ORDER — ISOSORBIDE MONONITRATE ER 30 MG PO TB24: 30.0000 mg | ORAL_TABLET | Freq: Every day | ORAL | 1 refills | Status: DC

## 2021-08-09 MED ORDER — ATORVASTATIN CALCIUM 40 MG PO TABS: 80.0000 mg | ORAL_TABLET | Freq: Every day | ORAL | 1 refills | Status: DC

## 2021-08-09 MED ORDER — CLOPIDOGREL BISULFATE 75 MG PO TABS: 75.0000 mg | ORAL_TABLET | Freq: Every day | ORAL | 0 refills | Status: DC

## 2021-08-09 MED ORDER — METOPROLOL TARTRATE 100 MG PO TABS: 100.0000 mg | ORAL_TABLET | Freq: Every day | ORAL | 1 refills | Status: DC

## 2021-08-09 MED ORDER — LISINOPRIL 40 MG PO TABS: 40.0000 mg | ORAL_TABLET | Freq: Every day | ORAL | 1 refills | Status: DC

## 2021-08-09 MED ORDER — METFORMIN HCL 1000 MG PO TABS: 1500.0000 mg | ORAL_TABLET | Freq: Every day | ORAL | 0 refills | Status: DC

## 2021-08-09 NOTE — Patient Instructions (Signed)
DASH Eating Plan DASH stands for Dietary Approaches to Stop Hypertension. The DASH eating plan is a healthy eating plan that has been shown to: Reduce high blood pressure (hypertension). Reduce your risk for type 2 diabetes, heart disease, and stroke. Help with weight loss. What are tips for following this plan? Reading food labels Check food labels for the amount of salt (sodium) per serving. Choose foods with less than 5 percent of the Daily Value of sodium. Generally, foods with less than 300 milligrams (mg) of sodium per serving fit into this eating plan. To find whole grains, look for the word "whole" as the first word in the ingredient list. Shopping Buy products labeled as "low-sodium" or "no salt added." Buy fresh foods. Avoid canned foods and pre-made or frozen meals. Cooking Avoid adding salt when cooking. Use salt-free seasonings or herbs instead of table salt or sea salt. Check with your health care provider or pharmacist before using salt substitutes. Do not fry foods. Cook foods using healthy methods such as baking, boiling, grilling, roasting, and broiling instead. Cook with heart-healthy oils, such as olive, canola, avocado, soybean, or sunflower oil. Meal planning  Eat a balanced diet that includes: 4 or more servings of fruits and 4 or more servings of vegetables each day. Try to fill one-half of your plate with fruits and vegetables. 6-8 servings of whole grains each day. Less than 6 oz (170 g) of lean meat, poultry, or fish each day. A 3-oz (85-g) serving of meat is about the same size as a deck of cards. One egg equals 1 oz (28 g). 2-3 servings of low-fat dairy each day. One serving is 1 cup (237 mL). 1 serving of nuts, seeds, or beans 5 times each week. 2-3 servings of heart-healthy fats. Healthy fats called omega-3 fatty acids are found in foods such as walnuts, flaxseeds, fortified milks, and eggs. These fats are also found in cold-water fish, such as sardines, salmon,  and mackerel. Limit how much you eat of: Canned or prepackaged foods. Food that is high in trans fat, such as some fried foods. Food that is high in saturated fat, such as fatty meat. Desserts and other sweets, sugary drinks, and other foods with added sugar. Full-fat dairy products. Do not salt foods before eating. Do not eat more than 4 egg yolks a week. Try to eat at least 2 vegetarian meals a week. Eat more home-cooked food and less restaurant, buffet, and fast food. Lifestyle When eating at a restaurant, ask that your food be prepared with less salt or no salt, if possible. If you drink alcohol: Limit how much you use to: 0-1 drink a day for women who are not pregnant. 0-2 drinks a day for men. Be aware of how much alcohol is in your drink. In the [place] one drink equals one 12 oz bottle of beer (355 mL), one 5 oz glass of wine (148 mL), or one 1 oz glass of hard liquor (44 mL). General information Avoid eating more than 2,300 mg of salt a day. If you have hypertension, you may need to reduce your sodium intake to 1,500 mg a day. Work with your health care provider to maintain a healthy body weight or to lose weight. Ask what an ideal weight is for you. Get at least 30 minutes of exercise that causes your heart to beat faster (aerobic exercise) most days of the week. Activities may include walking, swimming, or biking. Work with your health care provider or dietitian to   adjust your eating plan to your individual calorie needs. What foods should I eat? Fruits All fresh, dried, or frozen fruit. Canned fruit in natural juice (without added sugar). Vegetables Fresh or frozen vegetables (raw, steamed, roasted, or grilled). Low-sodium or reduced-sodium tomato and vegetable juice. Low-sodium or reduced-sodium tomato sauce and tomato paste. Low-sodium or reduced-sodium canned vegetables. Grains Whole-grain or whole-wheat bread. Whole-grain or whole-wheat pasta. Brown rice. Oatmeal. Quinoa.  Bulgur. Whole-grain and low-sodium cereals. Pita bread. Low-fat, low-sodium crackers. Whole-wheat flour tortillas. Meats and other proteins Skinless chicken or turkey. Ground chicken or turkey. Pork with fat trimmed off. Fish and seafood. Egg whites. Dried beans, peas, or lentils. Unsalted nuts, nut butters, and seeds. Unsalted canned beans. Lean cuts of beef with fat trimmed off. Low-sodium, lean precooked or cured meat, such as sausages or meat loaves. Dairy Low-fat (1%) or fat-free (skim) milk. Reduced-fat, low-fat, or fat-free cheeses. Nonfat, low-sodium ricotta or cottage cheese. Low-fat or nonfat yogurt. Low-fat, low-sodium cheese. Fats and oils Soft margarine without trans fats. Vegetable oil. Reduced-fat, low-fat, or light mayonnaise and salad dressings (reduced-sodium). Canola, safflower, olive, avocado, soybean, and sunflower oils. Avocado. Seasonings and condiments Herbs. Spices. Seasoning mixes without salt. Other foods Unsalted popcorn and pretzels. Fat-free sweets. The items listed above may not be a complete list of foods and beverages you can eat. Contact a dietitian for more information. What foods should I avoid? Fruits Canned fruit in a light or heavy syrup. Fried fruit. Fruit in cream or butter sauce. Vegetables Creamed or fried vegetables. Vegetables in a cheese sauce. Regular canned vegetables (not low-sodium or reduced-sodium). Regular canned tomato sauce and paste (not low-sodium or reduced-sodium). Regular tomato and vegetable juice (not low-sodium or reduced-sodium). Pickles. Olives. Grains Baked goods made with fat, such as croissants, muffins, or some breads. Dry pasta or rice meal packs. Meats and other proteins Fatty cuts of meat. Ribs. Fried meat. Bacon. Bologna, salami, and other precooked or cured meats, such as sausages or meat loaves. Fat from the back of a pig (fatback). Bratwurst. Salted nuts and seeds. Canned beans with added salt. Canned or smoked fish.  Whole eggs or egg yolks. Chicken or turkey with skin. Dairy Whole or 2% milk, cream, and half-and-half. Whole or full-fat cream cheese. Whole-fat or sweetened yogurt. Full-fat cheese. Nondairy creamers. Whipped toppings. Processed cheese and cheese spreads. Fats and oils Butter. Stick margarine. Lard. Shortening. Ghee. Bacon fat. Tropical oils, such as coconut, palm kernel, or palm oil. Seasonings and condiments Onion salt, garlic salt, seasoned salt, table salt, and sea salt. Worcestershire sauce. Tartar sauce. Barbecue sauce. Teriyaki sauce. Soy sauce, including reduced-sodium. Steak sauce. Canned and packaged gravies. Fish sauce. Oyster sauce. Cocktail sauce. Store-bought horseradish. Ketchup. Mustard. Meat flavorings and tenderizers. Bouillon cubes. Hot sauces. Pre-made or packaged marinades. Pre-made or packaged taco seasonings. Relishes. Regular salad dressings. Other foods Salted popcorn and pretzels. The items listed above may not be a complete list of foods and beverages you should avoid. Contact a dietitian for more information. Where to find more information National Heart, Lung, and Blood Institute: www.nhlbi.nih.gov American Heart Association: www.heart.org Academy of Nutrition and Dietetics: www.eatright.org National Kidney Foundation: www.kidney.org Summary The DASH eating plan is a healthy eating plan that has been shown to reduce high blood pressure (hypertension). It may also reduce your risk for type 2 diabetes, heart disease, and stroke. When on the DASH eating plan, aim to eat more fresh fruits and vegetables, whole grains, lean proteins, low-fat dairy, and heart-healthy fats. With the DASH   eating plan, you should limit salt (sodium) intake to 2,300 mg a day. If you have hypertension, you may need to reduce your sodium intake to 1,500 mg a day. Work with your health care provider or dietitian to adjust your eating plan to your individual calorie needs. This information is not  intended to replace advice given to you by your health care provider. Make sure you discuss any questions you have with your health care provider. Document Revised: 09/17/2019 Document Reviewed: 09/17/2019 Elsevier Patient Education  2022 Elsevier Inc. Carbohydrate Counting for Diabetes Mellitus, Adult Carbohydrate counting is a method of keeping track of how many carbohydrates you eat. Eating carbohydrates naturally increases the amount of sugar (glucose) in the blood. Counting how many carbohydrates you eat improves your blood glucose control, which helps you manage your diabetes. It is important to know how many carbohydrates you can safely have in each meal. This is different for every person. A dietitian can help you make a meal plan and calculate how many carbohydrates you should have at each meal and snack. What foods contain carbohydrates? Carbohydrates are found in the following foods: Grains, such as breads and cereals. Dried beans and soy products. Starchy vegetables, such as potatoes, peas, and corn. Fruit and fruit juices. Milk and yogurt. Sweets and snack foods, such as cake, cookies, candy, chips, and soft drinks. How do I count carbohydrates in foods? There are two ways to count carbohydrates in food. You can read food labels or learn standard serving sizes of foods. You can use either of the methods or a combination of both. Using the Nutrition Facts label The Nutrition Facts list is included on the labels of almost all packaged foods and beverages in the [place] It includes: The serving size. Information about nutrients in each serving, including the grams (g) of carbohydrate per serving. To use the Nutrition Facts: Decide how many servings you will have. Multiply the number of servings by the number of carbohydrates per serving. The resulting number is the total amount of carbohydrates that you will be having. Learning the standard serving sizes of foods When you eat  carbohydrate foods that are not packaged or do not include Nutrition Facts on the label, you need to measure the servings in order to count the amount of carbohydrates. Measure the foods that you will eat with a food scale or measuring cup, if needed. Decide how many standard-size servings you will eat. Multiply the number of servings by 15. For foods that contain carbohydrates, one serving equals 15 g of carbohydrates. For example, if you eat 2 cups or 10 oz (300 g) of strawberries, you will have eaten 2 servings and 30 g of carbohydrates (2 servings x 15 g = 30 g). For foods that have more than one food mixed, such as soups and casseroles, you must count the carbohydrates in each food that is included. The following list contains standard serving sizes of common carbohydrate-rich foods. Each of these servings has about 15 g of carbohydrates: 1 slice of bread. 1 six-inch (15 cm) tortilla. ? cup or 2 oz (53 g) cooked rice or pasta.  cup or 3 oz (85 g) cooked or canned, drained and rinsed beans or lentils.  cup or 3 oz (85 g) starchy vegetable, such as peas, corn, or squash.  cup or 4 oz (120 g) hot cereal.  cup or 3 oz (85 g) boiled or mashed potatoes, or  or 3 oz (85 g) of a large baked potato.    cup or 4 fl oz (118 mL) fruit juice. 1 cup or 8 fl oz (237 mL) milk. 1 small or 4 oz (106 g) apple.  or 2 oz (63 g) of a medium banana. 1 cup or 5 oz (150 g) strawberries. 3 cups or 1 oz (24 g) popped popcorn. What is an example of carbohydrate counting? To calculate the number of carbohydrates in this sample meal, follow the steps shown below. Sample meal 3 oz (85 g) chicken breast. ? cup or 4 oz (106 g) brown rice.  cup or 3 oz (85 g) corn. 1 cup or 8 fl oz (237 mL) milk. 1 cup or 5 oz (150 g) strawberries with sugar-free whipped topping. Carbohydrate calculation Identify the foods that contain carbohydrates: Rice. Corn. Milk. Strawberries. Calculate how many servings you have of  each food: 2 servings rice. 1 serving corn. 1 serving milk. 1 serving strawberries. Multiply each number of servings by 15 g: 2 servings rice x 15 g = 30 g. 1 serving corn x 15 g = 15 g. 1 serving milk x 15 g = 15 g. 1 serving strawberries x 15 g = 15 g. Add together all of the amounts to find the total grams of carbohydrates eaten: 30 g + 15 g + 15 g + 15 g = 75 g of carbohydrates total. What are tips for following this plan? Shopping Develop a meal plan and then make a shopping list. Buy fresh and frozen vegetables, fresh and frozen fruit, dairy, eggs, beans, lentils, and whole grains. Look at food labels. Choose foods that have more fiber and less sugar. Avoid processed foods and foods with added sugars. Meal planning Aim to have the same amount of carbohydrates at each meal and for each snack time. Plan to have regular, balanced meals and snacks. Where to find more information American Diabetes Association: www.diabetes.org Centers for Disease Control and Prevention: www.cdc.gov Summary Carbohydrate counting is a method of keeping track of how many carbohydrates you eat. Eating carbohydrates naturally increases the amount of sugar (glucose) in the blood. Counting how many carbohydrates you eat improves your blood glucose control, which helps you manage your diabetes. A dietitian can help you make a meal plan and calculate how many carbohydrates you should have at each meal and snack. This information is not intended to replace advice given to you by your health care provider. Make sure you discuss any questions you have with your health care provider. Document Revised: 10/14/2019 Document Reviewed: 10/15/2019 Elsevier Patient Education  2021 Elsevier Inc.  

## 2021-08-09 NOTE — Progress Notes (Signed)
Established Patient Office Visit  Subjective:  Patient ID: Laurie Kirby, female    DOB: 10-Dec-1960  Age: 60 y.o. MRN: [id number]  CC:  Chief Complaint  Patient presents with   Follow-up    Here for follow-up     HPI Laurie Kirby  is a 60 y/o female who has history of Type 2 diabetes mellitus, Hyperlipidemia, Hypertension, presents for lab review and medication refill. Her  HgbA1c increased from 6.1% to 7.3%, she doesn't check her blood glucose, denies hypoglycemia and peripheral neuropathy. Her blood glucose was 152 mg/dl when checked during visit. Her blood pressure was elevated, she was out of her medication for one week and reports that she will pick them up tomorrow. She doesn't check her blood pressure at home, and smokes occasionally. Her Total cholesterol was 233 mg/dl, Triglyceride 175 mg/dl and LDL was 146 mg/dl. Overall, she states that she's doing well and offers no further complaint. She received Influenza vaccine to her left deltoid during visit.  Past Medical History:  Diagnosis Date   Diabetes mellitus without complication ([place])    Hyperlipidemia    Hypertension     Past Surgical History:  Procedure Laterality Date   CORONARY ANGIOPLASTY WITH STENT PLACEMENT      Family History  Problem Relation Age of Onset   Diabetes type II Mother    Diabetes type II Father    Heart disease Father    Cancer Maternal Aunt    Breast cancer Cousin     Social History   Socioeconomic History   Marital status: Single    Spouse name: Not on file   Number of children: 3   Years of education: Not on file   Highest education level: High school graduate  Occupational History   Occupation: unemployed  Tobacco Use   Smoking status: Some Days    Types: Cigarettes    Last attempt to quit: 09/18/2020    Years since quitting: 0.8   Smokeless tobacco: Never   Tobacco comments:    quit following having teeth removed - pt reports she smokes when she drinks (edited:  08/09/2020=2)  Vaping Use   Vaping Use: Never used  Substance and Sexual Activity   Alcohol use: Yes    Alcohol/week: 1.0 standard drink    Types: 1 Glasses of wine per week    Comment:  wine on the weekends   Drug use: No   Sexual activity: Yes  Other Topics Concern   Not on file  Social History Narrative   A friend supports. Not on food stamps. Pretty well taken care of by friend.    Social Determinants of Health   Financial Resource Strain: Not on file  Food Insecurity: Not on file  Transportation Needs: Not on file  Physical Activity: Not on file  Stress: Not on file  Social Connections: Not on file  Intimate Partner Violence: Not on file    Outpatient Medications Prior to Visit  Medication Sig Dispense Refill   Ascorbic Acid (VITAMIN C) 1000 MG tablet Take 1,000 mg by mouth daily.     ASPIRIN LOW DOSE 81 MG EC tablet TAKE 1 TABLET BY MOUTH DAILY 90 tablet 0   famotidine (PEPCID) 20 MG tablet TAKE 1 TABLET BY MOUTH 2 TIMES DAILY AS NEEDED FOR HEARTBURN OR INDIGESTION 180 tablet 0   mometasone (ELOCON) 0.1 % ointment Apply topically daily. (Patient not taking: No sig reported) 45 g 0   atorvastatin (LIPITOR) 40 MG tablet Take  2 tablets (80 mg total) by mouth daily. 90 tablet 1   clopidogrel (PLAVIX) 75 MG tablet Take 1 tablet (75 mg total) by mouth daily. 90 tablet 0   glipiZIDE (GLUCOTROL) 10 MG tablet TAKE 2 TABLETS BY MOUTH DAILY BEFORE BREAKFAST 90 tablet 0   isosorbide mononitrate (IMDUR) 30 MG 24 hr tablet Take 1 tablet (30 mg total) by mouth daily. 90 tablet 0   lisinopril (ZESTRIL) 40 MG tablet Take 1 tablet (40 mg total) by mouth daily. Please take this medication in the morning 90 tablet 0   metFORMIN (GLUCOPHAGE) 1000 MG tablet Take 1.5 tablets (1,500 mg total) by mouth daily with breakfast. 135 tablet 0   metoprolol tartrate (LOPRESSOR) 100 MG tablet Take 1 tablet (100 mg total) by mouth daily. 90 tablet 0   No facility-administered medications prior to visit.     No Known Allergies  ROS Review of Systems  Constitutional: Negative.   Eyes: Negative.   Respiratory: Negative.    Cardiovascular: Negative.   Endocrine: Negative.   Skin: Negative.   Neurological: Negative.   Hematological: Negative.      Objective:    Physical Exam HENT:     Head: Normocephalic and atraumatic.     Mouth/Throat:     Mouth: Mucous membranes are moist.  Cardiovascular:     Rate and Rhythm: Normal rate and regular rhythm.     Pulses: Normal pulses.     Heart sounds: Normal heart sounds.  Pulmonary:     Effort: Pulmonary effort is normal.     Breath sounds: Normal breath sounds.  Skin:    General: Skin is warm.  Neurological:     General: No focal deficit present.     Mental Status: She is alert and oriented to person, place, and time. Mental status is at baseline.  Psychiatric:        Mood and Affect: Mood normal.        Behavior: Behavior normal.        Thought Content: Thought content normal.        Judgment: Judgment normal.    BP (!) 162/82 (BP Location: Left Arm, Patient Position: Sitting)   Pulse 91   Temp 98.1 F (36.7 C)   Ht 5' 7"  (1.702 m)   Wt 150 lb 8 oz (68.3 kg)   SpO2 96%   BMI 23.57 kg/m  Wt Readings from Last 3 Encounters:  08/09/21 150 lb 8 oz (68.3 kg)  05/03/21 145 lb 6.4 oz (66 kg)  02/15/21 132 lb 11.2 oz (60.2 kg)     Health Maintenance Due  Topic Date Due   HIV Screening  Never done   Hepatitis C Screening  Never done   TETANUS/TDAP  Never done   Zoster Vaccines- Shingrix (1 of 2) Never done   COLONOSCOPY (Pts 45-22yr Insurance coverage will need to be confirmed)  Never done   PAP SMEAR-Modifier  10/10/2018   MAMMOGRAM  10/29/2019   COVID-19 Vaccine (3 - Moderna risk series) 02/19/2020   OPHTHALMOLOGY EXAM  01/11/2021   INFLUENZA VACCINE  05/28/2021    There are no preventive care reminders to display for this patient.  Lab Results  Component Value Date   TSH 1.720 01/11/2021   Lab Results   Component Value Date   WBC 15.2 (H) 08/04/2019   HGB 15.4 08/04/2019   HCT 45.4 08/04/2019   MCV 90 08/04/2019   PLT 292 08/04/2019   Lab Results  Component Value Date  NA 142 08/02/2021   K 4.2 08/02/2021   CO2 21 08/02/2021   GLUCOSE 150 (H) 08/02/2021   BUN 16 08/02/2021   CREATININE 0.67 08/02/2021   BILITOT <0.2 08/02/2021   ALKPHOS 61 08/02/2021   AST 15 08/02/2021   ALT 15 08/02/2021   PROT 7.7 08/02/2021   ALBUMIN 4.5 08/02/2021   CALCIUM 9.7 08/02/2021   ANIONGAP 7 10/02/2017   EGFR 101 08/02/2021   Lab Results  Component Value Date   CHOL 233 (H) 08/02/2021   Lab Results  Component Value Date   HDL 56 08/02/2021   Lab Results  Component Value Date   LDLCALC 146 (H) 08/02/2021   Lab Results  Component Value Date   TRIG 175 (H) 08/02/2021   Lab Results  Component Value Date   CHOLHDL 4.2 08/02/2021   Lab Results  Component Value Date   HGBA1C 7.3 (H) 08/02/2021      Assessment & Plan:    1. Hyperlipidemia, unspecified hyperlipidemia type -The 10-year ASCVD risk score (Arnett DK, et al., 2019) is: 52.4%   Values used to calculate the score:     Age: 59 years     Sex: Female     Is Non-Hispanic African American: Yes     Diabetic: Yes     Tobacco smoker: Yes     Systolic Blood Pressure: 614 mmHg     Is BP treated: Yes     HDL Cholesterol: 56 mg/dL     Total Cholesterol: 233 mg/dL Her ASCVD was 52.4%, she was encouraged to continue on her current regimen, low fat/cholesterol diet and exercise as tolerated. - atorvastatin (LIPITOR) 40 MG tablet; Take 2 tablets (80 mg total) by mouth daily.  Dispense: 90 tablet; Refill: 1 - clopidogrel (PLAVIX) 75 MG tablet; Take 1 tablet (75 mg total) by mouth daily.  Dispense: 90 tablet; Refill: 0  2. 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 - clopidogrel (PLAVIX) 75 MG tablet; Take 1  tablet (75 mg total) by mouth daily.  Dispense: 90 tablet; Refill: 0 - isosorbide mononitrate (IMDUR) 30 MG 24 hr tablet; Take 1 tablet (30 mg total) by mouth daily.  Dispense: 90 tablet; Refill: 1  3. Diabetes mellitus without complication ([place]) - Her HgbA1c was 7.3%, her goal should be less than 7%. She was encouraged to check her blood glucose at least daily, low carb/non concentrated sweet diet and exercise as tolerated. -- POCT Glucose (CBG); Future - POCT Glucose (CBG) - Urine Microalbumin w/creat. ratio; Future - Urine Microalbumin w/creat. ratio - glipiZIDE (GLUCOTROL) 10 MG tablet; TAKE 2 TABLETS BY MOUTH DAILY BEFORE BREAKFAST  Dispense: 180 tablet; Refill: 1 - metFORMIN (GLUCOPHAGE) 1000 MG tablet; Take 1.5 tablets (1,500 mg total) by mouth daily with breakfast.  Dispense: 270 tablet; Refill: 0  4. Essential hypertension - Her blood pressure is not controlled, was encouraged to pick up medication, DASH diet and exercise as tolerated. - lisinopril (ZESTRIL) 40 MG tablet; Take 1 tablet (40 mg total) by mouth daily. Please take this medication in the morning  Dispense: 90 tablet; Refill: 1 - metoprolol tartrate (LOPRESSOR) 100 MG tablet; Take 1 tablet (100 mg total) by mouth daily.  Dispense: 90 tablet; Refill: 1   5. Health care maintenance  - Flu Vaccine QUAD 6+ mos PF IM (Fluarix Quad PF) was administered to left deltoid.     Follow-up: Return in about 4 weeks (  around 09/06/2021), or if symptoms worsen or fail to improve.    Cassaundra Rasch Jerold Coombe, NP

## 2021-08-10 LAB — MICROALBUMIN / CREATININE URINE RATIO
Creatinine, Urine: 185.8 mg/dL
Microalb/Creat Ratio: 340 mg/g creat — ABNORMAL HIGH (ref 0–29)
Microalbumin, Urine: 632.2 ug/mL

## 2021-09-06 ENCOUNTER — Ambulatory Visit: Payer: Self-pay

## 2021-09-13 ENCOUNTER — Ambulatory Visit: Payer: Self-pay

## 2021-09-27 ENCOUNTER — Encounter (INDEPENDENT_AMBULATORY_CARE_PROVIDER_SITE_OTHER): Payer: Self-pay

## 2021-09-27 ENCOUNTER — Other Ambulatory Visit: Payer: Self-pay

## 2021-09-27 ENCOUNTER — Ambulatory Visit: Payer: Self-pay | Admitting: Gerontology

## 2021-09-27 VITALS — BP 160/90 | HR 56 | Temp 98.4°F | Ht 63.0 in | Wt 149.1 lb

## 2021-09-27 DIAGNOSIS — I10 Essential (primary) hypertension: Secondary | ICD-10-CM

## 2021-09-27 DIAGNOSIS — E785 Hyperlipidemia, unspecified: Secondary | ICD-10-CM

## 2021-09-27 NOTE — Patient Instructions (Signed)

## 2021-09-27 NOTE — Progress Notes (Signed)
Established Patient Office Visit  Subjective:  Patient ID: Laurie Kirby, female    DOB: [birth year]  Age: 60 y.o. MRN: [id number]  CC:  Chief Complaint  Patient presents with   Follow-up    Pt's blood pressure machine does not work and she was told to continue to take it which is why she is here today.     HPI Laurie Kirby is a 60 y/o female who has history of Type 2 diabetes mellitus, Hyperlipidemia, Hypertension,  presents for follow up of hypertension. She states that she's compliant with her medications, denies side effects and continues to make healthy lifestyle changes. Her blood pressure is still elevated, states that her blood pressure machine is non functional, denies chest pain, palpitation, headache and vision changes. Overall, she states that she's doing well and offers no further complaint.  Past Medical History:  Diagnosis Date   Diabetes mellitus without complication ([place])    Hyperlipidemia    Hypertension     Past Surgical History:  Procedure Laterality Date   CORONARY ANGIOPLASTY WITH STENT PLACEMENT      Family History  Problem Relation Age of Onset   Diabetes type II Mother    Diabetes type II Father    Heart disease Father    Cancer Maternal Aunt    Breast cancer Cousin     Social History   Socioeconomic History   Marital status: Single    Spouse name: Not on file   Number of children: 3   Years of education: Not on file   Highest education level: High school graduate  Occupational History   Occupation: unemployed  Tobacco Use   Smoking status: Some Days    Types: Cigarettes    Last attempt to quit: 09/18/2020    Years since quitting: 1.0   Smokeless tobacco: Never   Tobacco comments:    quit following having teeth removed - pt reports she smokes when she drinks (edited: 08/09/2020=2)  Vaping Use   Vaping Use: Never used  Substance and Sexual Activity   Alcohol use: Yes    Alcohol/week: 1.0 standard drink    Types: 1 Glasses of  wine per week    Comment:  wine on the weekends   Drug use: No   Sexual activity: Yes  Other Topics Concern   Not on file  Social History Narrative   A friend supports. Not on food stamps. Pretty well taken care of by friend.    Social Determinants of Health   Financial Resource Strain: Not on file  Food Insecurity: Not on file  Transportation Needs: Not on file  Physical Activity: Not on file  Stress: Not on file  Social Connections: Not on file  Intimate Partner Violence: Not on file    Outpatient Medications Prior to Visit  Medication Sig Dispense Refill   Ascorbic Acid (VITAMIN C) 1000 MG tablet Take 1,000 mg by mouth daily.     ASPIRIN LOW DOSE 81 MG EC tablet TAKE 1 TABLET BY MOUTH DAILY 90 tablet 0   atorvastatin (LIPITOR) 40 MG tablet Take 2 tablets (80 mg total) by mouth daily. 90 tablet 1   clopidogrel (PLAVIX) 75 MG tablet Take 1 tablet (75 mg total) by mouth daily. 90 tablet 0   famotidine (PEPCID) 20 MG tablet TAKE 1 TABLET BY MOUTH 2 TIMES DAILY AS NEEDED FOR HEARTBURN OR INDIGESTION 180 tablet 0   glipiZIDE (GLUCOTROL) 10 MG tablet TAKE 2 TABLETS BY MOUTH DAILY BEFORE BREAKFAST 180  tablet 1   isosorbide mononitrate (IMDUR) 30 MG 24 hr tablet Take 1 tablet (30 mg total) by mouth daily. 90 tablet 1   lisinopril (ZESTRIL) 40 MG tablet Take 1 tablet (40 mg total) by mouth daily. Please take this medication in the morning 90 tablet 1   metFORMIN (GLUCOPHAGE) 1000 MG tablet Take 1.5 tablets (1,500 mg total) by mouth daily with breakfast. 270 tablet 0   metoprolol tartrate (LOPRESSOR) 100 MG tablet Take 1 tablet (100 mg total) by mouth daily. 90 tablet 1   mometasone (ELOCON) 0.1 % ointment Apply topically daily. (Patient not taking: Reported on 02/15/2021) 45 g 0   No facility-administered medications prior to visit.    No Known Allergies  ROS Review of Systems  Constitutional: Negative.   Eyes: Negative.   Respiratory: Negative.    Cardiovascular: Negative.    Neurological: Negative.      Objective:    Physical Exam HENT:     Head: Normocephalic and atraumatic.     Mouth/Throat:     Mouth: Mucous membranes are moist.  Eyes:     Extraocular Movements: Extraocular movements intact.     Conjunctiva/sclera: Conjunctivae normal.     Pupils: Pupils are equal, round, and reactive to light.  Cardiovascular:     Rate and Rhythm: Normal rate and regular rhythm.     Pulses: Normal pulses.     Heart sounds: Normal heart sounds.  Pulmonary:     Effort: Pulmonary effort is normal.     Breath sounds: Normal breath sounds.  Skin:    General: Skin is warm.  Neurological:     General: No focal deficit present.     Mental Status: She is alert and oriented to person, place, and time. Mental status is at baseline.    BP (!) 160/90 (BP Location: Right Arm, Patient Position: Sitting, Cuff Size: Normal)   Pulse (!) 56   Temp 98.4 F (36.9 C)   Ht _0  (1.6 m)   Wt 149 lb 1.6 oz (67.6 kg)   SpO2 98%   BMI 26.41 kg/m  Wt Readings from Last 3 Encounters:  09/27/21 149 lb 1.6 oz (67.6 kg)  08/09/21 150 lb 8 oz (68.3 kg)  05/03/21 145 lb 6.4 oz (66 kg)     Health Maintenance Due  Topic Date Due   Pneumococcal Vaccine 44-59 Years old (1 - PCV) Never done   HIV Screening  Never done   Hepatitis C Screening  Never done   TETANUS/TDAP  Never done   Zoster Vaccines- Shingrix (1 of 2) Never done   COLONOSCOPY (Pts 45-75yr Insurance coverage will need to be confirmed)  Never done   PAP SMEAR-Modifier  10/10/2018   MAMMOGRAM  10/29/2019   COVID-19 Vaccine (3 - Moderna risk series) 02/19/2020   OPHTHALMOLOGY EXAM  01/11/2021    There are no preventive care reminders to display for this patient.  Lab Results  Component Value Date   TSH 1.720 01/11/2021   Lab Results  Component Value Date   WBC 15.2 (H) 08/04/2019   HGB 15.4 08/04/2019   HCT 45.4 08/04/2019   MCV 90 08/04/2019   PLT 292 08/04/2019   Lab Results  Component Value Date    NA 142 08/02/2021   K 4.2 08/02/2021   CO2 21 08/02/2021   GLUCOSE 150 (H) 08/02/2021   BUN 16 08/02/2021   CREATININE 0.67 08/02/2021   BILITOT <0.2 08/02/2021   ALKPHOS 61 08/02/2021   AST 15  08/02/2021   ALT 15 08/02/2021   PROT 7.7 08/02/2021   ALBUMIN 4.5 08/02/2021   CALCIUM 9.7 08/02/2021   ANIONGAP 7 10/02/2017   EGFR 101 08/02/2021   Lab Results  Component Value Date   CHOL 233 (H) 08/02/2021   Lab Results  Component Value Date   HDL 56 08/02/2021   Lab Results  Component Value Date   LDLCALC 146 (H) 08/02/2021   Lab Results  Component Value Date   TRIG 175 (H) 08/02/2021   Lab Results  Component Value Date   CHOLHDL 4.2 08/02/2021   Lab Results  Component Value Date   HGBA1C 7.3 (H) 08/02/2021      Assessment & Plan:   1. Essential hypertension -Her blood pressure is not under control, she will continue on current medication, DASH diet and exercise as tolerated. She was referred to - [facility]ulatory referral to [facility] [place]  2. Hyperlipidemia, unspecified hyperlipidemia type - The 10-year ASCVD risk score (Arnett DK, et al., 2019) is: 51.1%   Values used to calculate the score:     Age: 33 years     Sex: Female     Is Non-Hispanic African American: Yes     Diabetic: Yes     Tobacco smoker: Yes     Systolic Blood Pressure: 254 mmHg     Is BP treated: Yes     HDL Cholesterol: 56 mg/dL     Total Cholesterol: 233 mg/dL  Her ASCVD risk was 51.1%, she will continue on current medication, low fat/cholesterol diet. Will recheck - Lipid panel; Future - HgB A1c; Future       Follow-up: Return in about 6 weeks (around 11/08/2021), or if symptoms worsen or fail to improve.    Miley Lindon Jerold Coombe, NP

## 2021-10-16 ENCOUNTER — Other Ambulatory Visit: Payer: Self-pay

## 2021-11-01 ENCOUNTER — Other Ambulatory Visit: Payer: Self-pay

## 2021-11-01 DIAGNOSIS — E785 Hyperlipidemia, unspecified: Secondary | ICD-10-CM

## 2021-11-02 LAB — LIPID PANEL
Chol/HDL Ratio: 4 ratio (ref 0.0–4.4)
Cholesterol, Total: 238 mg/dL — ABNORMAL HIGH (ref 100–199)
HDL: 60 mg/dL (ref >39)
LDL Chol Calc (NIH): 150 mg/dL — ABNORMAL HIGH (ref 0–99)
Triglycerides: 158 mg/dL — ABNORMAL HIGH (ref 0–149)
VLDL Cholesterol Cal: 28 mg/dL (ref 5–40)

## 2021-11-02 LAB — HEMOGLOBIN A1C
Est. average glucose Bld gHb Est-mCnc: 180 mg/dL
Hgb A1c MFr Bld: 7.9 % — ABNORMAL HIGH (ref 4.8–5.6)

## 2021-11-08 ENCOUNTER — Ambulatory Visit: Payer: Self-pay | Admitting: Gerontology

## 2021-11-08 ENCOUNTER — Other Ambulatory Visit: Payer: Self-pay

## 2021-11-08 VITALS — BP 149/81 | HR 90 | Temp 98.2°F | Resp 16 | Ht 63.0 in | Wt 151.7 lb

## 2021-11-08 DIAGNOSIS — I10 Essential (primary) hypertension: Secondary | ICD-10-CM

## 2021-11-08 DIAGNOSIS — E119 Type 2 diabetes mellitus without complications: Secondary | ICD-10-CM

## 2021-11-08 DIAGNOSIS — E785 Hyperlipidemia, unspecified: Secondary | ICD-10-CM

## 2021-11-08 DIAGNOSIS — I25119 Atherosclerotic heart disease of native coronary artery with unspecified angina pectoris: Secondary | ICD-10-CM

## 2021-11-08 LAB — GLUCOSE, POCT (MANUAL RESULT ENTRY): POC Glucose: 134 mg/dl — AB (ref 70–99)

## 2021-11-08 MED ORDER — CLOPIDOGREL BISULFATE 75 MG PO TABS: 75.0000 mg | ORAL_TABLET | Freq: Every day | ORAL | 0 refills | Status: DC

## 2021-11-08 NOTE — Progress Notes (Signed)
Established Patient Office Visit  Subjective:  Patient ID: Laurie Kirby, female    DOB: 03-23-1961  Age: 61 y.o. MRN: [id number]  CC:  Chief Complaint  Patient presents with   Follow-up   Hypertension   Diabetes    HPI Laurie Kirby is a 61 y/o female who has history of Type 2 diabetes mellitus, Hyperlipidemia, Hypertension presents for lab review. She checks her blood pressure daily and it's usullay between 140's -150/ less than 80. Her HgbA1c done on 11/01/21 increased from 7.3% to 7.9%. She states that she doesn't check her blood glucose. He denies hypo/hyperglycemic symptoms, peripheral neuropathy and performs daily foot checks. Her blood glucose reading was 134 mg/dl during visit.Marland Kitchen Her LDL increased from 146 mg/dl to 150 mg/dl, Total cholesterol from 233 mg/dl to 238 mg/dl and Triglycerides decreased from 175 mg/dl to 158 mg/dl . She states that she's compliant with her medications, denies side effects and continues to make healthy lifestyle changes. Overall, she states that she's doing well and offers no further complaint.    Past Medical History:  Diagnosis Date   Diabetes mellitus without complication ([place])    Hyperlipidemia    Hypertension     Past Surgical History:  Procedure Laterality Date   CORONARY ANGIOPLASTY WITH STENT PLACEMENT      Family History  Problem Relation Age of Onset   Diabetes type II Mother    Diabetes type II Father    Heart disease Father    Cancer Maternal Aunt    Breast cancer Cousin     Social History   Socioeconomic History   Marital status: Single    Spouse name: Not on file   Number of children: 3   Years of education: Not on file   Highest education level: High school graduate  Occupational History   Occupation: unemployed  Tobacco Use   Smoking status: Some Days    Types: Cigarettes   Smokeless tobacco: Never   Tobacco comments:    quit following having teeth removed - pt reports she smokes when she drinks (edited:  08/09/2020=2)  Vaping Use   Vaping Use: Never used  Substance and Sexual Activity   Alcohol use: Yes    Alcohol/week: 1.0 standard drink    Types: 1 Glasses of wine per week    Comment:  wine on the weekends   Drug use: No   Sexual activity: Yes  Other Topics Concern   Not on file  Social History Narrative   A friend supports. Not on food stamps. Pretty well taken care of by friend.    Social Determinants of Health   Financial Resource Strain: Not on file  Food Insecurity: No Food Insecurity   Worried About Charity fundraiser in the Last Year: Never true   Ran Out of Food in the Last Year: Never true  Transportation Needs: No Transportation Needs   Lack of Transportation (Medical): No   Lack of Transportation (Non-Medical): No  Physical Activity: Not on file  Stress: Not on file  Social Connections: Not on file  Intimate Partner Violence: Not on file    Outpatient Medications Prior to Visit  Medication Sig Dispense Refill   Ascorbic Acid (VITAMIN C) 1000 MG tablet Take 1,000 mg by mouth daily.     ASPIRIN LOW DOSE 81 MG EC tablet TAKE 1 TABLET BY MOUTH DAILY 90 tablet 0   atorvastatin (LIPITOR) 40 MG tablet Take 2 tablets (80 mg total) by mouth daily. [place]  tablet 1   famotidine (PEPCID) 20 MG tablet TAKE 1 TABLET BY MOUTH 2 TIMES DAILY AS NEEDED FOR HEARTBURN OR INDIGESTION 180 tablet 0   glipiZIDE (GLUCOTROL) 10 MG tablet TAKE 2 TABLETS BY MOUTH DAILY BEFORE BREAKFAST 180 tablet 1   isosorbide mononitrate (IMDUR) 30 MG 24 hr tablet Take 1 tablet (30 mg total) by mouth daily. 90 tablet 1   lisinopril (ZESTRIL) 40 MG tablet Take 1 tablet (40 mg total) by mouth daily. Please take this medication in the morning 90 tablet 1   metFORMIN (GLUCOPHAGE) 1000 MG tablet Take 1.5 tablets (1,500 mg total) by mouth daily with breakfast. 270 tablet 0   metoprolol tartrate (LOPRESSOR) 100 MG tablet Take 1 tablet (100 mg total) by mouth daily. 90 tablet 1   clopidogrel (PLAVIX) 75 MG tablet  Take 1 tablet (75 mg total) by mouth daily. 90 tablet 0   mometasone (ELOCON) 0.1 % ointment Apply topically daily. (Patient not taking: Reported on 02/15/2021) 45 g 0   No facility-administered medications prior to visit.    No Known Allergies  ROS Review of Systems  Constitutional: Negative.   Eyes: Negative.   Respiratory: Negative.    Cardiovascular: Negative.   Endocrine: Negative.   Skin: Negative.   Neurological: Negative.   Hematological: Negative.   Psychiatric/Behavioral: Negative.       Objective:    Physical Exam HENT:     Head: Normocephalic and atraumatic.     Mouth/Throat:     Mouth: Mucous membranes are moist.  Eyes:     Extraocular Movements: Extraocular movements intact.     Conjunctiva/sclera: Conjunctivae normal.     Pupils: Pupils are equal, round, and reactive to light.  Cardiovascular:     Rate and Rhythm: Normal rate and regular rhythm.     Pulses: Normal pulses.     Heart sounds: Normal heart sounds.  Pulmonary:     Effort: Pulmonary effort is normal.     Breath sounds: Normal breath sounds.  Skin:    General: Skin is warm.  Neurological:     General: No focal deficit present.     Mental Status: She is alert and oriented to person, place, and time. Mental status is at baseline.  Psychiatric:        Mood and Affect: Mood normal.        Behavior: Behavior normal.        Thought Content: Thought content normal.        Judgment: Judgment normal.    BP (!) 149/81 (BP Location: Right Arm, Patient Position: Sitting, Cuff Size: Large)    Pulse 90    Temp 98.2 F (36.8 C) (Oral)    Resp 16    Ht 5' 3"  (1.6 m)    Wt 151 lb 11.2 oz (68.8 kg)    SpO2 95%    BMI 26.87 kg/m  Wt Readings from Last 3 Encounters:  11/08/21 151 lb 11.2 oz (68.8 kg)  09/27/21 149 lb 1.6 oz (67.6 kg)  08/09/21 150 lb 8 oz (68.3 kg)     Health Maintenance Due  Topic Date Due   Pneumococcal Vaccine 59-89 Years old (1 - PCV) Never done   HIV Screening  Never done    Hepatitis C Screening  Never done   TETANUS/TDAP  Never done   Zoster Vaccines- Shingrix (1 of 2) Never done   COLONOSCOPY (Pts 45-50yr Insurance coverage will need to be confirmed)  Never done   PAP SMEAR-Modifier  10/10/2018   MAMMOGRAM  10/29/2019   COVID-19 Vaccine (3 - Moderna risk series) 02/19/2020   OPHTHALMOLOGY EXAM  01/11/2021    There are no preventive care reminders to display for this patient.  Lab Results  Component Value Date   TSH 1.720 01/11/2021   Lab Results  Component Value Date   WBC 15.2 (H) 08/04/2019   HGB 15.4 08/04/2019   HCT 45.4 08/04/2019   MCV 90 08/04/2019   PLT 292 08/04/2019   Lab Results  Component Value Date   NA 142 08/02/2021   K 4.2 08/02/2021   CO2 21 08/02/2021   GLUCOSE 150 (H) 08/02/2021   BUN 16 08/02/2021   CREATININE 0.67 08/02/2021   BILITOT <0.2 08/02/2021   ALKPHOS 61 08/02/2021   AST 15 08/02/2021   ALT 15 08/02/2021   PROT 7.7 08/02/2021   ALBUMIN 4.5 08/02/2021   CALCIUM 9.7 08/02/2021   ANIONGAP 7 10/02/2017   EGFR 101 08/02/2021   Lab Results  Component Value Date   CHOL 238 (H) 11/01/2021   Lab Results  Component Value Date   HDL 60 11/01/2021   Lab Results  Component Value Date   LDLCALC 150 (H) 11/01/2021   Lab Results  Component Value Date   TRIG 158 (H) 11/01/2021   Lab Results  Component Value Date   CHOLHDL 4.0 11/01/2021   Lab Results  Component Value Date   HGBA1C 7.9 (H) 11/01/2021      Assessment & Plan:    1. Hyperlipidemia, unspecified hyperlipidemia type - Her LDL is not under control, her goal should be less than 70 mg/dl. She will continue on current medication, low fat/cholesterol diet and exercise as tolerated.  2. 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 -  clopidogrel (PLAVIX) 75 MG tablet; Take 1 tablet (75 mg total) by mouth daily.  Dispense: 90 tablet; Refill: 0  3. Essential hypertension - Her blood pressure is improving, her goal should be less than 130/90. She will continue on current medication, and DASH diet.  4. Diabetes mellitus without complication ([facility]) - Her HgbA1c was 7.9%, her goal should be less than 7%. She was encouraged to check her blood glucose daily. She will continue on current medication, low carb/non concentrated sweet diet. - POCT Glucose (CBG); Future - POCT Glucose (CBG)   Follow-up: Return in about 13 weeks (around 02/07/2022), or if symptoms worsen or fail to improve.    Suvi Archuletta Jerold Coombe, NP

## 2021-11-08 NOTE — Patient Instructions (Signed)
DASH Eating Plan °DASH stands for Dietary Approaches to Stop Hypertension. The DASH eating plan is a healthy eating plan that has been shown to: °Reduce high blood pressure (hypertension). °Reduce your risk for type 2 diabetes, heart disease, and stroke. °Help with weight loss. °What are tips for following this plan? °Reading food labels °Check food labels for the amount of salt (sodium) per serving. Choose foods with less than 5 percent of the Daily Value of sodium. Generally, foods with less than 300 milligrams (mg) of sodium per serving fit into this eating plan. °To find whole grains, look for the word "whole" as the first word in the ingredient list. °Shopping °Buy products labeled as "low-sodium" or "no salt added." °Buy fresh foods. Avoid canned foods and pre-made or frozen meals. °Cooking °Avoid adding salt when cooking. Use salt-free seasonings or herbs instead of table salt or sea salt. Check with your health care provider or pharmacist before using salt substitutes. °Do not fry foods. Cook foods using healthy methods such as baking, boiling, grilling, roasting, and broiling instead. °Cook with heart-healthy oils, such as olive, canola, avocado, soybean, or sunflower oil. °Meal planning ° °Eat a balanced diet that includes: °4 or more servings of fruits and 4 or more servings of vegetables each day. Try to fill one-half of your plate with fruits and vegetables. °6-8 servings of whole grains each day. °Less than 6 oz (170 g) of lean meat, poultry, or fish each day. A 3-oz (85-g) serving of meat is about the same size as a deck of cards. One egg equals 1 oz (28 g). °2-3 servings of low-fat dairy each day. One serving is 1 cup (237 mL). °1 serving of nuts, seeds, or beans 5 times each week. °2-3 servings of heart-healthy fats. Healthy fats called omega-3 fatty acids are found in foods such as walnuts, flaxseeds, fortified milks, and eggs. These fats are also found in cold-water fish, such as sardines, salmon,  and mackerel. °Limit how much you eat of: °Canned or prepackaged foods. °Food that is high in trans fat, such as some fried foods. °Food that is high in saturated fat, such as fatty meat. °Desserts and other sweets, sugary drinks, and other foods with added sugar. °Full-fat dairy products. °Do not salt foods before eating. °Do not eat more than 4 egg yolks a week. °Try to eat at least 2 vegetarian meals a week. °Eat more home-cooked food and less restaurant, buffet, and fast food. °Lifestyle °When eating at a restaurant, ask that your food be prepared with less salt or no salt, if possible. °If you drink alcohol: °Limit how much you use to: °0-1 drink a day for women who are not pregnant. °0-2 drinks a day for men. °Be aware of how much alcohol is in your drink. In the [place] one drink equals one 12 oz bottle of beer (355 mL), one 5 oz glass of wine (148 mL), or one 1½ oz glass of hard liquor (44 mL). °General information °Avoid eating more than 2,300 mg of salt a day. If you have hypertension, you may need to reduce your sodium intake to 1,500 mg a day. °Work with your health care provider to maintain a healthy body weight or to lose weight. Ask what an ideal weight is for you. °Get at least 30 minutes of exercise that causes your heart to beat faster (aerobic exercise) most days of the week. Activities may include walking, swimming, or biking. °Work with your health care provider or dietitian to   adjust your eating plan to your individual calorie needs. °What foods should I eat? °Fruits °All fresh, dried, or frozen fruit. Canned fruit in natural juice (without added sugar). °Vegetables °Fresh or frozen vegetables (raw, steamed, roasted, or grilled). Low-sodium or reduced-sodium tomato and vegetable juice. Low-sodium or reduced-sodium tomato sauce and tomato paste. Low-sodium or reduced-sodium canned vegetables. °Grains °Whole-grain or whole-wheat bread. Whole-grain or whole-wheat pasta. Brown rice. Oatmeal. Quinoa.  Bulgur. Whole-grain and low-sodium cereals. Pita bread. Low-fat, low-sodium crackers. Whole-wheat flour tortillas. °Meats and other proteins °Skinless chicken or turkey. Ground chicken or turkey. Pork with fat trimmed off. Fish and seafood. Egg whites. Dried beans, peas, or lentils. Unsalted nuts, nut butters, and seeds. Unsalted canned beans. Lean cuts of beef with fat trimmed off. Low-sodium, lean precooked or cured meat, such as sausages or meat loaves. °Dairy °Low-fat (1%) or fat-free (skim) milk. Reduced-fat, low-fat, or fat-free cheeses. Nonfat, low-sodium ricotta or cottage cheese. Low-fat or nonfat yogurt. Low-fat, low-sodium cheese. °Fats and oils °Soft margarine without trans fats. Vegetable oil. Reduced-fat, low-fat, or light mayonnaise and salad dressings (reduced-sodium). Canola, safflower, olive, avocado, soybean, and sunflower oils. Avocado. °Seasonings and condiments °Herbs. Spices. Seasoning mixes without salt. °Other foods °Unsalted popcorn and pretzels. Fat-free sweets. °The items listed above may not be a complete list of foods and beverages you can eat. Contact a dietitian for more information. °What foods should I avoid? °Fruits °Canned fruit in a light or heavy syrup. Fried fruit. Fruit in cream or butter sauce. °Vegetables °Creamed or fried vegetables. Vegetables in a cheese sauce. Regular canned vegetables (not low-sodium or reduced-sodium). Regular canned tomato sauce and paste (not low-sodium or reduced-sodium). Regular tomato and vegetable juice (not low-sodium or reduced-sodium). Pickles. Olives. °Grains °Baked goods made with fat, such as croissants, muffins, or some breads. Dry pasta or rice meal packs. °Meats and other proteins °Fatty cuts of meat. Ribs. Fried meat. Bacon. Bologna, salami, and other precooked or cured meats, such as sausages or meat loaves. Fat from the back of a pig (fatback). Bratwurst. Salted nuts and seeds. Canned beans with added salt. Canned or smoked fish.  Whole eggs or egg yolks. Chicken or turkey with skin. °Dairy °Whole or 2% milk, cream, and half-and-half. Whole or full-fat cream cheese. Whole-fat or sweetened yogurt. Full-fat cheese. Nondairy creamers. Whipped toppings. Processed cheese and cheese spreads. °Fats and oils °Butter. Stick margarine. Lard. Shortening. Ghee. Bacon fat. Tropical oils, such as coconut, palm kernel, or palm oil. °Seasonings and condiments °Onion salt, garlic salt, seasoned salt, table salt, and sea salt. Worcestershire sauce. Tartar sauce. Barbecue sauce. Teriyaki sauce. Soy sauce, including reduced-sodium. Steak sauce. Canned and packaged gravies. Fish sauce. Oyster sauce. Cocktail sauce. Store-bought horseradish. Ketchup. Mustard. Meat flavorings and tenderizers. Bouillon cubes. Hot sauces. Pre-made or packaged marinades. Pre-made or packaged taco seasonings. Relishes. Regular salad dressings. °Other foods °Salted popcorn and pretzels. °The items listed above may not be a complete list of foods and beverages you should avoid. Contact a dietitian for more information. °Where to find more information °National Heart, Lung, and Blood Institute: www.nhlbi.nih.gov °American Heart Association: www.heart.org °Academy of Nutrition and Dietetics: www.eatright.org °National Kidney Foundation: www.kidney.org °Summary °The DASH eating plan is a healthy eating plan that has been shown to reduce high blood pressure (hypertension). It may also reduce your risk for type 2 diabetes, heart disease, and stroke. °When on the DASH eating plan, aim to eat more fresh fruits and vegetables, whole grains, lean proteins, low-fat dairy, and heart-healthy fats. °With the DASH   eating plan, you should limit salt (sodium) intake to 2,300 mg a day. If you have hypertension, you may need to reduce your sodium intake to 1,500 mg a day. Work with your health care provider or dietitian to adjust your eating plan to your individual calorie needs. This information is not  intended to replace advice given to you by your health care provider. Make sure you discuss any questions you have with your health care provider. Document Revised: 09/17/2019 Document Reviewed: 09/17/2019 Elsevier Patient Education  2022 [employer] Heart-Healthy Eating Plan Many factors influence your heart (coronary) health, including eating and exercise habits. Coronary risk increases with abnormal blood fat (lipid) levels. Heart-healthy meal planning includes limiting unhealthy fats, increasing healthy fats, and making other diet and lifestyle changes. What is my plan? Your health care provider may recommend that you: Limit your fat intake to _________% or less of your total calories each day. Limit your saturated fat intake to _________% or less of your total calories each day. Limit the amount of cholesterol in your diet to less than _________ mg per day. What are tips for following this plan? Cooking Cook foods using methods other than frying. Baking, boiling, grilling, and broiling are all good options. Other ways to reduce fat include: Removing the skin from poultry. Removing all visible fats from meats. Steaming vegetables in water or broth. Meal planning  At meals, imagine dividing your plate into fourths: Fill one-half of your plate with vegetables and green salads. Fill one-fourth of your plate with whole grains. Fill one-fourth of your plate with lean protein foods. Eat 4-5 servings of vegetables per day. One serving equals 1 cup raw or cooked vegetable, or 2 cups raw leafy greens. Eat 4-5 servings of fruit per day. One serving equals 1 medium whole fruit,  cup dried fruit,  cup fresh, frozen, or canned fruit, or  cup 100% fruit juice. Eat more foods that contain soluble fiber. Examples include apples, broccoli, carrots, beans, peas, and barley. Aim to get 25-30 g of fiber per day. Increase your consumption of legumes, nuts, and seeds to 4-5 servings per week. One  serving of dried beans or legumes equals  cup cooked, 1 serving of nuts is  cup, and 1 serving of seeds equals 1 tablespoon. Fats Choose healthy fats more often. Choose monounsaturated and polyunsaturated fats, such as olive and canola oils, flaxseeds, walnuts, almonds, and seeds. Eat more omega-3 fats. Choose salmon, mackerel, sardines, tuna, flaxseed oil, and ground flaxseeds. Aim to eat fish at least 2 times each week. Check food labels carefully to identify foods with trans fats or high amounts of saturated fat. Limit saturated fats. These are found in animal products, such as meats, butter, and cream. Plant sources of saturated fats include palm oil, palm kernel oil, and coconut oil. Avoid foods with partially hydrogenated oils in them. These contain trans fats. Examples are stick margarine, some tub margarines, cookies, crackers, and other baked goods. Avoid fried foods. General information Eat more home-cooked food and less restaurant, buffet, and fast food. Limit or avoid alcohol. Limit foods that are high in starch and sugar. Lose weight if you are overweight. Losing just 5-10% of your body weight can help your overall health and prevent diseases such as diabetes and heart disease. Monitor your salt (sodium) intake, especially if you have high blood pressure. Talk with your health care provider about your sodium intake. Try to incorporate more vegetarian meals weekly. What foods can I eat? Fruits All fresh,  canned (in natural juice), or frozen fruits. Vegetables Fresh or frozen vegetables (raw, steamed, roasted, or grilled). Green salads. Grains Most grains. Choose whole wheat and whole grains most of the time. Rice and pasta, including brown rice and pastas made with whole wheat. Meats and other proteins Lean, well-trimmed beef, veal, pork, and lamb. Chicken and [place] without skin. All fish and shellfish. Wild duck, rabbit, pheasant, and venison. Egg whites or low-cholesterol egg  substitutes. Dried beans, peas, lentils, and tofu. Seeds and most nuts. Dairy Low-fat or nonfat cheeses, including ricotta and mozzarella. Skim or 1% milk (liquid, powdered, or evaporated). Buttermilk made with low-fat milk. Nonfat or low-fat yogurt. Fats and oils Non-hydrogenated (trans-free) margarines. Vegetable oils, including soybean, sesame, sunflower, olive, peanut, safflower, corn, canola, and cottonseed. Salad dressings or mayonnaise made with a vegetable oil. Beverages Water (mineral or sparkling). Coffee and tea. Diet carbonated beverages. Sweets and desserts Sherbet, gelatin, and fruit ice. Small amounts of dark chocolate. Limit all sweets and desserts. Seasonings and condiments All seasonings and condiments. The items listed above may not be a complete list of foods and beverages you can eat. Contact a dietitian for more options. What foods are not recommended? Fruits Canned fruit in heavy syrup. Fruit in cream or butter sauce. Fried fruit. Limit coconut. Vegetables Vegetables cooked in cheese, cream, or butter sauce. Fried vegetables. Grains Breads made with saturated or trans fats, oils, or whole milk. Croissants. Sweet rolls. Donuts. High-fat crackers, such as cheese crackers. Meats and other proteins Fatty meats, such as hot dogs, ribs, sausage, bacon, rib-eye roast or steak. High-fat deli meats, such as salami and bologna. Caviar. Domestic duck and goose. Organ meats, such as liver. Dairy Cream, sour cream, cream cheese, and creamed cottage cheese. Whole-milk cheeses. Whole or 2% milk (liquid, evaporated, or condensed). Whole buttermilk. Cream sauce or high-fat cheese sauce. Whole-milk yogurt. Fats and oils Meat fat, or shortening. Cocoa butter, hydrogenated oils, palm oil, coconut oil, palm kernel oil. Solid fats and shortenings, including bacon fat, salt pork, lard, and butter. Nondairy cream substitutes. Salad dressings with cheese or sour cream. Beverages Regular  sodas and any drinks with added sugar. Sweets and desserts Frosting. Pudding. Cookies. Cakes. Pies. Milk chocolate or Falzone chocolate. Buttered syrups. Full-fat ice cream or ice cream drinks. The items listed above may not be a complete list of foods and beverages to avoid. Contact a dietitian for more information. Summary Heart-healthy meal planning includes limiting unhealthy fats, increasing healthy fats, and making other diet and lifestyle changes. Lose weight if you are overweight. Losing just 5-10% of your body weight can help your overall health and prevent diseases such as diabetes and heart disease. Focus on eating a balance of foods, including fruits and vegetables, low-fat or nonfat dairy, lean protein, nuts and legumes, whole grains, and heart-healthy oils and fats. This information is not intended to replace advice given to you by your health care provider. Make sure you discuss any questions you have with your health care provider. Document Revised: 02/22/2021 Document Reviewed: 02/22/2021 Elsevier Patient Education  2022 [employer] Carbohydrate Counting for Diabetes Mellitus, Adult Carbohydrate counting is a method of keeping track of how many carbohydrates you eat. Eating carbohydrates increases the amount of sugar (glucose) in the blood. Counting how many carbohydrates you eat improves how well you manage your blood glucose. This, in turn, helps you manage your diabetes. Carbohydrates are measured in grams (g) per serving. It is important to know how many carbohydrates (in grams or by  serving size) you can have in each meal. This is different for every person. A dietitian can help you make a meal plan and calculate how many carbohydrates you should have at each meal and snack. What foods contain carbohydrates? Carbohydrates are found in the following foods: Grains, such as breads and cereals. Dried beans and soy products. Starchy vegetables, such as potatoes, peas, and  corn. Fruit and fruit juices. Milk and yogurt. Sweets and snack foods, such as cake, cookies, candy, chips, and soft drinks. How do I count carbohydrates in foods? There are two ways to count carbohydrates in food. You can read food labels or learn standard serving sizes of foods. You can use either of these methods or a combination of both. Using the Nutrition Facts label The Nutrition Facts list is included on the labels of almost all packaged foods and beverages in the [place]. It includes: The serving size. Information about nutrients in each serving, including the grams of carbohydrate per serving. To use the Nutrition Facts, decide how many servings you will have. Then, multiply the number of servings by the number of carbohydrates per serving. The resulting number is the total grams of carbohydrates that you will be having. Learning the standard serving sizes of foods When you eat carbohydrate foods that are not packaged or do not include Nutrition Facts on the label, you need to measure the servings in order to count the grams of carbohydrates. Measure the foods that you will eat with a food scale or measuring cup, if needed. Decide how many standard-size servings you will eat. Multiply the number of servings by 15. For foods that contain carbohydrates, one serving equals 15 g of carbohydrates. For example, if you eat 2 cups or 10 oz (300 g) of strawberries, you will have eaten 2 servings and 30 g of carbohydrates (2 servings x 15 g = 30 g). For foods that have more than one food mixed, such as soups and casseroles, you must count the carbohydrates in each food that is included. The following list contains standard serving sizes of common carbohydrate-rich foods. Each of these servings has about 15 g of carbohydrates: 1 slice of bread. 1 six-inch (15 cm) tortilla. ? cup or 2 oz (53 g) cooked rice or pasta.  cup or 3 oz (85 g) cooked or canned, drained and rinsed beans or  lentils.  cup or 3 oz (85 g) starchy vegetable, such as peas, corn, or squash.  cup or 4 oz (120 g) hot cereal.  cup or 3 oz (85 g) boiled or mashed potatoes, or  or 3 oz (85 g) of a large baked potato.  cup or 4 fl oz (118 mL) fruit juice. 1 cup or 8 fl oz (237 mL) milk. 1 small or 4 oz (106 g) apple.  or 2 oz (63 g) of a medium banana. 1 cup or 5 oz (150 g) strawberries. 3 cups or 1 oz (28.3 g) popped popcorn. What is an example of carbohydrate counting? To calculate the grams of carbohydrates in this sample meal, follow the steps shown below. Sample meal 3 oz (85 g) chicken breast. ? cup or 4 oz (106 g) brown rice.  cup or 3 oz (85 g) corn. 1 cup or 8 fl oz (237 mL) milk. 1 cup or 5 oz (150 g) strawberries with sugar-free whipped topping. Carbohydrate calculation Identify the foods that contain carbohydrates: Rice. Corn. Milk. Strawberries. Calculate how many servings you have of each food: 2 servings rice.  1 serving corn. 1 serving milk. 1 serving strawberries. Multiply each number of servings by 15 g: 2 servings rice x 15 g = 30 g. 1 serving corn x 15 g = 15 g. 1 serving milk x 15 g = 15 g. 1 serving strawberries x 15 g = 15 g. Add together all of the amounts to find the total grams of carbohydrates eaten: 30 g + 15 g + 15 g + 15 g = 75 g of carbohydrates total. What are tips for following this plan? Shopping Develop a meal plan and then make a shopping list. Buy fresh and frozen vegetables, fresh and frozen fruit, dairy, eggs, beans, lentils, and whole grains. Look at food labels. Choose foods that have more fiber and less sugar. Avoid processed foods and foods with added sugars. Meal planning Aim to have the same number of grams of carbohydrates at each meal and for each snack time. Plan to have regular, balanced meals and snacks. Where to find more information American Diabetes Association: diabetes.org Centers for Disease Control and Prevention:  [employer] Academy of Nutrition and Dietetics: eatright.org Association of Diabetes Care & Education Specialists: diabeteseducator.org Summary Carbohydrate counting is a method of keeping track of how many carbohydrates you eat. Eating carbohydrates increases the amount of sugar (glucose) in your blood. Counting how many carbohydrates you eat improves how well you manage your blood glucose. This helps you manage your diabetes. A dietitian can help you make a meal plan and calculate how many carbohydrates you should have at each meal and snack. This information is not intended to replace advice given to you by your health care provider. Make sure you discuss any questions you have with your health care provider. Document Revised: 05/17/2020 Document Reviewed: 05/17/2020 Elsevier Patient Education  2022 [employer].

## 2022-01-09 ENCOUNTER — Other Ambulatory Visit: Payer: Self-pay | Admitting: Gerontology

## 2022-01-09 DIAGNOSIS — E785 Hyperlipidemia, unspecified: Secondary | ICD-10-CM

## 2022-02-07 ENCOUNTER — Ambulatory Visit: Payer: Self-pay

## 2022-02-08 ENCOUNTER — Other Ambulatory Visit: Payer: Self-pay | Admitting: Gerontology

## 2022-02-08 DIAGNOSIS — E119 Type 2 diabetes mellitus without complications: Secondary | ICD-10-CM

## 2022-02-14 ENCOUNTER — Ambulatory Visit: Payer: Self-pay | Admitting: Family Medicine

## 2022-02-14 VITALS — BP 166/81 | HR 64 | Temp 98.4°F | Resp 18 | Ht 66.5 in | Wt 158.9 lb

## 2022-02-14 DIAGNOSIS — E119 Type 2 diabetes mellitus without complications: Secondary | ICD-10-CM

## 2022-02-14 DIAGNOSIS — E785 Hyperlipidemia, unspecified: Secondary | ICD-10-CM

## 2022-02-14 DIAGNOSIS — I10 Essential (primary) hypertension: Secondary | ICD-10-CM

## 2022-02-14 LAB — POCT GLYCOSYLATED HEMOGLOBIN (HGB A1C): Hemoglobin A1C: 8.8 % — AB (ref 4.0–5.6)

## 2022-02-14 LAB — GLUCOSE, POCT (MANUAL RESULT ENTRY): POC Glucose: 136 mg/dl — AB (ref 70–99)

## 2022-02-14 MED ORDER — GLIPIZIDE 10 MG PO TABS: ORAL_TABLET | ORAL | 1 refills | Status: DC

## 2022-02-14 NOTE — Patient Instructions (Signed)

## 2022-02-14 NOTE — Progress Notes (Signed)
? ?Established Patient Office Visit ? ?Subjective   ?Patient ID: Laurie Kirby, female    DOB: 19-Jun-1961  Age: 61 y.o. MRN: [id number] ? ?Chief Complaint  ?Patient presents with  ? Follow-up  ?  Follow-up  ? ? ?Pt in clinic for follow up for history of HTN and DM.  ? ?Pt not checking blood glucose and checks BP on occassions.  Is taking medications as directed.   ? ?   Pt Snacks throughout the day with chips, reese peanut butter cups  ? ? ?Patient Active Problem List  ? Diagnosis Date Noted  ? Health care maintenance 02/15/2021  ? History of leukocytosis 08/14/2019  ? Essential hypertension 12/01/2018  ? Right hip pain 03/12/2018  ? Tobacco abuse 09/03/2017  ? Abnormal Schultes blood cell (WBC) 08/28/2017  ? Hyperlipidemia 01/13/2016  ? Abnormal Pap smear of cervix 11/30/2015  ? Diabetes mellitus without complication (HCC) 07/27/2015  ? Coronary atherosclerosis of native coronary artery 07/27/2015  ? Post-menopausal bleeding 03/15/2014  ? ?  ? ?Review of Systems  ?Constitutional:  Negative for chills, fever, malaise/fatigue and weight loss.  ?HENT:  Negative for congestion, hearing loss and sore throat.   ?Eyes:  Negative for blurred vision, double vision and photophobia.  ?Respiratory:  Negative for shortness of breath.   ?Cardiovascular:  Negative for chest pain.  ?Gastrointestinal:  Positive for heartburn. Negative for abdominal pain, blood in stool, constipation, diarrhea, nausea and vomiting.  ?     Occasionally  ?   ?Genitourinary:  Negative for dysuria and frequency.  ?Musculoskeletal:  Negative for back pain, joint pain and neck pain.  ?Skin:  Negative for itching and rash.  ?Neurological:  Negative for dizziness, weakness and headaches.  ?Endo/Heme/Allergies:  Does not bruise/bleed easily.  ?Psychiatric/Behavioral:  Negative for depression, substance abuse and suicidal ideas.   ? ?  ?Objective:  ?  ? ?BP (!) 166/81 (BP Location: Left Arm, Patient Position: Sitting, Cuff Size: Normal)   Pulse 64   Temp 98.4  ?F (36.9 ?C) (Oral)   Resp 18   Ht 5' 6.5" (1.689 m)   Wt 158 lb 14.4 oz (72.1 kg)   SpO2 95%   BMI 25.26 kg/m?  ?  ? ?Physical Exam ?Vitals and nursing note reviewed.  ?Constitutional:   ?   Appearance: Normal appearance.  ?HENT:  ?   Head: Normocephalic and atraumatic.  ?   Mouth/Throat:  ?   Mouth: Mucous membranes are moist.  ?   Pharynx: No oropharyngeal exudate or posterior oropharyngeal erythema.  ?Eyes:  ?   General: No scleral icterus. ?Cardiovascular:  ?   Rate and Rhythm: Normal rate and regular rhythm.  ?   Pulses: Normal pulses.  ?Pulmonary:  ?   Effort: Pulmonary effort is normal.  ?   Breath sounds: Normal breath sounds.  ?Abdominal:  ?   General: Abdomen is flat. Bowel sounds are normal.  ?   Palpations: Abdomen is soft.  ?Musculoskeletal:     ?   General: Normal range of motion.  ?   Cervical back: Normal range of motion and neck supple.  ?Skin: ?   General: Skin is warm and dry.  ?Neurological:  ?   General: No focal deficit present.  ?   Mental Status: She is alert.  ?Psychiatric:     ?   Mood and Affect: Mood normal.     ?   Behavior: Behavior normal.  ? ? ? ?No results found for any visits on  02/14/22. ? ?Last hemoglobin A1c ?Lab Results  ?Component Value Date  ? HGBA1C 8.8 (A) 02/14/2022  ? ?  ? ?The 10-year ASCVD risk score (Arnett DK, et al., 2019) is: 54.1% ? ?  ?Assessment & Plan:  ? ?Problem List Items Addressed This Visit   ? ?  ? Cardiovascular and Mediastinum  ? Essential hypertension  ?  ? Other  ? Hyperlipidemia  ? ?Other Visit Diagnoses   ? ? Type 2 diabetes mellitus without complication, without long-term current use of insulin (HCC)    -  Primary  ? Relevant Orders  ? POCT HgB A1C  ? POCT Glucose (CBG)  ? ?  ?1. Type 2 diabetes mellitus without complication, without long-term current use of insulin (HCC) ?Discussed taking blood glucose, pt in agreement to take BG one time daily.  ?Discussed results of A1C  ? ?- POCT HgB A1C; Future ?- POCT Glucose (CBG); Future ?- POCT Glucose  (CBG) ?- POCT HgB A1C ? ?2. Essential hypertension ?3. Hyperlipidemia, unspecified hyperlipidemia type ?4. Diabetes mellitus without complication (HCC) ? ?- glipiZIDE (GLUCOTROL) 10 MG tablet; TAKE 2 TABLETS BY MOUTH EVERY DAY BEFOREBREAKFAST  Dispense: 180 tablet; Refill: 1 ? ?Discussed diet and lifestyle modifications.  Discussed meals, pt reports adding salt to foods after prepared.  Discussed modifications such as no added salt, Ms. Dash, baking foods instead of frying. ?Discussed exercise such as You Tube videos that can be done in the safety of her home. ? ?Discussed medication compliance.  ? ?No follow-ups on file.  ? ? ?Wendi Snipes, FNP ? ?

## 2022-03-11 ENCOUNTER — Other Ambulatory Visit: Payer: Self-pay | Admitting: Gerontology

## 2022-03-11 DIAGNOSIS — I10 Essential (primary) hypertension: Secondary | ICD-10-CM

## 2022-03-11 DIAGNOSIS — E119 Type 2 diabetes mellitus without complications: Secondary | ICD-10-CM

## 2022-03-12 ENCOUNTER — Other Ambulatory Visit: Payer: Self-pay | Admitting: Gerontology

## 2022-03-12 DIAGNOSIS — I25119 Atherosclerotic heart disease of native coronary artery with unspecified angina pectoris: Secondary | ICD-10-CM

## 2022-03-22 ENCOUNTER — Other Ambulatory Visit: Payer: Self-pay

## 2022-03-22 ENCOUNTER — Inpatient Hospital Stay
Admission: EM | Admit: 2022-03-22 | Discharge: 2022-03-25 | DRG: 305 | Disposition: A | Discharge status: Home / Self Care | Payer: Self-pay | Attending: Internal Medicine | Admitting: Internal Medicine

## 2022-03-22 ENCOUNTER — Emergency Department: Payer: Self-pay

## 2022-03-22 DIAGNOSIS — I472 Ventricular tachycardia, unspecified: Secondary | ICD-10-CM | POA: Diagnosis present

## 2022-03-22 DIAGNOSIS — I2510 Atherosclerotic heart disease of native coronary artery without angina pectoris: Secondary | ICD-10-CM | POA: Diagnosis present

## 2022-03-22 DIAGNOSIS — Z833 Family history of diabetes mellitus: Secondary | ICD-10-CM

## 2022-03-22 DIAGNOSIS — Z7902 Long term (current) use of antithrombotics/antiplatelets: Secondary | ICD-10-CM

## 2022-03-22 DIAGNOSIS — I214 Non-ST elevation (NSTEMI) myocardial infarction: Principal | ICD-10-CM

## 2022-03-22 DIAGNOSIS — Z720 Tobacco use: Secondary | ICD-10-CM | POA: Diagnosis present

## 2022-03-22 DIAGNOSIS — I161 Hypertensive emergency: Principal | ICD-10-CM | POA: Diagnosis present

## 2022-03-22 DIAGNOSIS — Z79899 Other long term (current) drug therapy: Secondary | ICD-10-CM

## 2022-03-22 DIAGNOSIS — I25110 Atherosclerotic heart disease of native coronary artery with unstable angina pectoris: Secondary | ICD-10-CM | POA: Diagnosis present

## 2022-03-22 DIAGNOSIS — K219 Gastro-esophageal reflux disease without esophagitis: Secondary | ICD-10-CM | POA: Diagnosis present

## 2022-03-22 DIAGNOSIS — I200 Unstable angina: Secondary | ICD-10-CM

## 2022-03-22 DIAGNOSIS — F17210 Nicotine dependence, cigarettes, uncomplicated: Secondary | ICD-10-CM | POA: Diagnosis present

## 2022-03-22 DIAGNOSIS — Z7984 Long term (current) use of oral hypoglycemic drugs: Secondary | ICD-10-CM

## 2022-03-22 DIAGNOSIS — I10 Essential (primary) hypertension: Secondary | ICD-10-CM

## 2022-03-22 DIAGNOSIS — E119 Type 2 diabetes mellitus without complications: Secondary | ICD-10-CM

## 2022-03-22 DIAGNOSIS — Z7982 Long term (current) use of aspirin: Secondary | ICD-10-CM

## 2022-03-22 DIAGNOSIS — R200 Anesthesia of skin: Secondary | ICD-10-CM | POA: Diagnosis not present

## 2022-03-22 DIAGNOSIS — Z9861 Coronary angioplasty status: Secondary | ICD-10-CM

## 2022-03-22 DIAGNOSIS — I493 Ventricular premature depolarization: Secondary | ICD-10-CM | POA: Diagnosis present

## 2022-03-22 DIAGNOSIS — Z91148 Patient's other noncompliance with medication regimen for other reason: Secondary | ICD-10-CM

## 2022-03-22 DIAGNOSIS — Z803 Family history of malignant neoplasm of breast: Secondary | ICD-10-CM

## 2022-03-22 DIAGNOSIS — E785 Hyperlipidemia, unspecified: Secondary | ICD-10-CM | POA: Diagnosis present

## 2022-03-22 DIAGNOSIS — I421 Obstructive hypertrophic cardiomyopathy: Secondary | ICD-10-CM | POA: Diagnosis present

## 2022-03-22 DIAGNOSIS — R079 Chest pain, unspecified: Secondary | ICD-10-CM | POA: Diagnosis present

## 2022-03-22 HISTORY — DX: Atherosclerotic heart disease of native coronary artery without angina pectoris: I25.10

## 2022-03-22 LAB — CBC
HCT: 41.4 % (ref 36.0–46.0)
Hemoglobin: 13.5 g/dL (ref 12.0–15.0)
MCH: 29.5 pg (ref 26.0–34.0)
MCHC: 32.6 g/dL (ref 30.0–36.0)
MCV: 90.4 fL (ref 80.0–100.0)
Platelets: 261 10*3/uL (ref 150–400)
RBC: 4.58 MIL/uL (ref 3.87–5.11)
RDW: 12.6 % (ref 11.5–15.5)
WBC: 17.5 10*3/uL — ABNORMAL HIGH (ref 4.0–10.5)
nRBC: 0 % (ref 0.0–0.2)

## 2022-03-22 LAB — BASIC METABOLIC PANEL
Anion gap: 10 (ref 5–15)
BUN: 16 mg/dL (ref 6–20)
CO2: 25 mmol/L (ref 22–32)
Calcium: 9.4 mg/dL (ref 8.9–10.3)
Chloride: 106 mmol/L (ref 98–111)
Creatinine, Ser: 0.63 mg/dL (ref 0.44–1.00)
GFR, Estimated: >60 mL/min (ref >60)
Glucose, Bld: 179 mg/dL — ABNORMAL HIGH (ref 70–99)
Potassium: 3.6 mmol/L (ref 3.5–5.1)
Sodium: 141 mmol/L (ref 135–145)

## 2022-03-22 LAB — TROPONIN I (HIGH SENSITIVITY): Troponin I (High Sensitivity): 92 ng/L — ABNORMAL HIGH (ref <18)

## 2022-03-22 MED ORDER — NITROGLYCERIN 2 % TD OINT
1.0000 [in_us] | TOPICAL_OINTMENT | Freq: Once | TRANSDERMAL | Status: AC
  Administered 2022-03-23: 1 [in_us] via TOPICAL
  Filled 2022-03-22: qty 1

## 2022-03-22 MED ORDER — ASPIRIN 81 MG PO CHEW
324.0000 mg | CHEWABLE_TABLET | Freq: Once | ORAL | Status: AC
  Administered 2022-03-23: 324 mg via ORAL
  Filled 2022-03-22: qty 4

## 2022-03-22 NOTE — H&P (Incomplete)
History and Physical    Patient: Laurie Kirby AJO:[id number] DOB: 29-Jun-1961 DOA: 03/22/2022 DOS: the patient was seen and examined on 03/23/2022 PCP: System, Provider Not In  Patient coming from: Home  Chief Complaint:  Chief Complaint  Patient presents with  . Chest Pain   HPI: Laurie Kirby is a 61 y.o. female with medical history significant of ***  Review of Systems: {ROS_Text:26778} Past Medical History:  Diagnosis Date  . Diabetes mellitus without complication (HCC)   . Hyperlipidemia   . Hypertension    Past Surgical History:  Procedure Laterality Date  . CORONARY ANGIOPLASTY WITH STENT PLACEMENT     Social History:  reports that she has been smoking cigarettes. She has never used smokeless tobacco. She reports current alcohol use of about 1.0 standard drink per week. She reports that she does not use drugs.  No Known Allergies  Family History  Problem Relation Age of Onset  . Diabetes type II Mother   . Diabetes type II Father   . Heart disease Father   . Cancer Maternal Aunt   . Breast cancer Cousin     Prior to Admission medications   Medication Sig Start Date End Date Taking? Authorizing Provider  Ascorbic Acid (VITAMIN C) 1000 MG tablet Take 1,000 mg by mouth daily.   Yes [provider]  ASPIRIN LOW DOSE 81 MG EC tablet TAKE 1 TABLET BY MOUTH DAILY Patient taking differently: Take 81 mg by mouth daily. 06/20/21  Yes [name], [name], NP  atorvastatin (LIPITOR) 40 MG tablet TAKE 2 TABLETS (80 MG TOTAL) BY MOUTH DAILY 01/09/22  Yes [name], [name], NP  clopidogrel (PLAVIX) 75 MG tablet Take 1 tablet (75 mg total) by mouth daily. 11/08/21  Yes [name], [name], NP  glipiZIDE (GLUCOTROL) 10 MG tablet TAKE 2 TABLETS BY MOUTH EVERY DAY BEFOREBREAKFAST Patient taking differently: Take 20 mg by mouth daily before breakfast. TAKE 2 TABLETS BY MOUTH EVERY DAY BEFOREBREAKFAST 02/14/22  Yes [name], [name], NP  isosorbide mononitrate (IMDUR)  30 MG 24 hr tablet TAKE 1 TABLET BY MOUTH DAILY Patient taking differently: Take 30 mg by mouth daily. 03/12/22  Yes [name], [name], NP  lisinopril (ZESTRIL) 40 MG tablet TAKE 1 TABLET BY MOUTH DAILY. PLEASE TAKE THIS IN THE MORNING. Patient taking differently: Take 40 mg by mouth daily. 03/12/22  Yes [name], [name], NP  metFORMIN (GLUCOPHAGE) 1000 MG tablet TAKE 1 AND 1/2 TABLETS BY MOUTH DAILY WITH BREAKFAST Patient taking differently: Take 1,500 mg by mouth daily with breakfast. 03/12/22  Yes [name], [name], NP  metoprolol tartrate (LOPRESSOR) 100 MG tablet TAKE 1 TABLET BY MOUTH DAILY Patient taking differently: Take 100 mg by mouth daily. 03/12/22  Yes [name], [name], NP  famotidine (PEPCID) 20 MG tablet TAKE 1 TABLET BY MOUTH 2 TIMES DAILY AS NEEDED FOR HEARTBURN OR INDIGESTION Patient taking differently: Take 20 mg by mouth 2 (two) times daily. 06/20/21   Rolm Gala, NP    Physical Exam: Vitals:   03/22/22 2119 03/22/22 2121 03/22/22 2350  BP:  (!) 213/83   Pulse: 71  66  Resp: 18  16  Temp: 98.3 F (36.8 C)    SpO2: 98%  99%   *** Data Reviewed: {Tip this will not be part of the note when signed- Document your independent interpretation of telemetry tracing, EKG, lab, Radiology test or any other diagnostic tests. Add any new diagnostic test ordered today. (Optional):26781} {Results:26384}  Assessment and  Plan: No notes have been filed under this hospital service. Service: Hospitalist     Advance Care Planning:   Code Status: Not on file ***  Consults: ***  Family Communication: ***  Severity of Illness: {Observation/Inpatient:21159}  Author: Gertha Calkin, MD 03/23/2022 12:00 AM  For on call review [email or web address].

## 2022-03-22 NOTE — ED Triage Notes (Signed)
Pt c/o of intermittent CP since yesterday that is worse when laying down. Pt is AOX4, in NAD. Pt denies SOB, N/V/D, dizziness. Pt states she has had a stent placed in the past. Denies blood thinner.

## 2022-03-22 NOTE — H&P (Signed)
History and Physical    Patient: Laurie Kirby DOB: [birth year] DOA: 03/22/2022 DOS: the patient was seen and examined on 03/23/2022 PCP: System, Provider Not In  Patient coming from: Home  Chief Complaint:  Chief Complaint  Patient presents with   Chest Pain   HPI: Laurie Kirby is a 61 y.o. female with medical history significant of CAD and stent placed 10 years here at [facility], dm II coming with chest pain . Pt smokes about 4 cig a day. Started yesterday while playing with kids.   Duration:1 days.   Frequency:intermittent.  Location:left side    Quality:unknown.   Rate:10/10   Radiation:NR  Aggravating: supine 10/10   Alleviating:upright 5/10.  Associated factors:no sob, assoc factors.   Review of Systems  Respiratory: Negative.    Cardiovascular:  Positive for chest pain and palpitations. Negative for orthopnea, claudication, leg swelling and PND.  Neurological: Negative.   All other systems reviewed and are negative.  Past Medical History:  Diagnosis Date   Diabetes mellitus without complication (HCC)    Hyperlipidemia    Hypertension    Past Surgical History:  Procedure Laterality Date   CORONARY ANGIOPLASTY WITH STENT PLACEMENT     Social History:  reports that she has been smoking cigarettes. She has never used smokeless tobacco. She reports current alcohol use of about 1.0 standard drink per week. She reports that she does not use drugs.  No Known Allergies  Family History  Problem Relation Age of Onset   Diabetes type II Mother    Diabetes type II Father    Heart disease Father    Cancer Maternal Aunt    Breast cancer Cousin     Prior to Admission medications   Medication Sig Start Date End Date Taking? Authorizing Provider  Ascorbic Acid (VITAMIN C) 1000 MG tablet Take 1,000 mg by mouth daily.   Yes [provider]  ASPIRIN LOW DOSE 81 MG EC tablet TAKE 1 TABLET BY MOUTH DAILY Patient taking differently: Take 81  mg by mouth daily. 06/20/21  Yes [name], [name], NP  atorvastatin (LIPITOR) 40 MG tablet TAKE 2 TABLETS (80 MG TOTAL) BY MOUTH DAILY 01/09/22  Yes [name], [name], NP  clopidogrel (PLAVIX) 75 MG tablet Take 1 tablet (75 mg total) by mouth daily. 11/08/21  Yes [name], [name], NP  glipiZIDE (GLUCOTROL) 10 MG tablet TAKE 2 TABLETS BY MOUTH EVERY DAY BEFOREBREAKFAST Patient taking differently: Take 20 mg by mouth daily before breakfast. TAKE 2 TABLETS BY MOUTH EVERY DAY BEFOREBREAKFAST 02/14/22  Yes [name], [name], NP  isosorbide mononitrate (IMDUR) 30 MG 24 hr tablet TAKE 1 TABLET BY MOUTH DAILY Patient taking differently: Take 30 mg by mouth daily. 03/12/22  Yes [name], [name], NP  lisinopril (ZESTRIL) 40 MG tablet TAKE 1 TABLET BY MOUTH DAILY. PLEASE TAKE THIS IN THE MORNING. Patient taking differently: Take 40 mg by mouth daily. 03/12/22  Yes [name], [name], NP  metFORMIN (GLUCOPHAGE) 1000 MG tablet TAKE 1 AND 1/2 TABLETS BY MOUTH DAILY WITH BREAKFAST Patient taking differently: Take 1,500 mg by mouth daily with breakfast. 03/12/22  Yes [name], [name], NP  metoprolol tartrate (LOPRESSOR) 100 MG tablet TAKE 1 TABLET BY MOUTH DAILY Patient taking differently: Take 100 mg by mouth daily. 03/12/22  Yes [name], [name], NP  famotidine (PEPCID) 20 MG tablet TAKE 1 TABLET BY MOUTH 2 TIMES DAILY AS NEEDED FOR HEARTBURN OR INDIGESTION Patient taking differently: Take 20 mg by mouth 2 (  two) times daily. 06/20/21   Rolm Gala, NP    Physical Exam: Vitals:   03/22/22 2350 03/23/22 0010 03/23/22 0030 03/23/22 0045  BP:  (!) 131/119 (!) 207/83 (!) 217/84  Pulse: 66 68 (!) 58 64  Resp: Temp:      SpO2: 99% 99% 97% 99%  Weight:    71.8 kg  Height:     (1.6 m)  Physical Exam Vitals and nursing note reviewed.  Constitutional:      General: She is not in acute distress.    Appearance: Normal appearance. She is not ill-appearing,  toxic-appearing or diaphoretic.  HENT:     Head: Normocephalic and atraumatic.     Right Ear: Hearing and external ear normal.     Left Ear: Hearing and external ear normal.     Nose: Nose normal. No nasal deformity.     Mouth/Throat:     Lips: Pink.     Mouth: Mucous membranes are moist.     Tongue: No lesions.     Pharynx: Oropharynx is clear.  Eyes:     Extraocular Movements: Extraocular movements intact.     Pupils: Pupils are equal, round, and reactive to light.  Neck:     Vascular: No carotid bruit.  Cardiovascular:     Rate and Rhythm: Normal rate and regular rhythm.     Pulses: Normal pulses.     Heart sounds: Normal heart sounds.  Pulmonary:     Effort: Pulmonary effort is normal.     Breath sounds: Normal breath sounds.  Abdominal:     General: Bowel sounds are normal. There is no distension.     Palpations: Abdomen is soft. There is no mass.     Tenderness: There is no abdominal tenderness. There is no guarding.     Hernia: No hernia is present.  Musculoskeletal:       Arms:     Right lower leg: No edema.     Left lower leg: No edema.     Comments: Chest pain area , NR.  Skin:    General: Skin is warm.  Neurological:     General: No focal deficit present.     Mental Status: She is alert and oriented to person, place, and time.     Cranial Nerves: Cranial nerves 2-12 are intact.     Motor: Motor function is intact.  Psychiatric:        Attention and Perception: Attention normal.        Mood and Affect: Mood normal.        Speech: Speech normal.        Behavior: Behavior normal. Behavior is cooperative.        Cognition and Memory: Cognition normal.    Data Reviewed: Results for orders placed or performed during the hospital encounter of 03/22/22 (from the past 24 hour(s))  Basic metabolic panel     Status: Abnormal   Collection Time: 03/22/22  9:19 PM  Result Value Ref Range   Sodium 141 135 - 145 mmol/L   Potassium 3.6 3.5 - 5.1 mmol/L   Chloride 106  98 - 111 mmol/L   CO2 25 22 - 32 mmol/L   Glucose, Bld 179 (H) 70 - 99 mg/dL   BUN 16 6 - 20 mg/dL   Creatinine, Ser 1.61 0.44 - 1.00 mg/dL   Calcium 9.4 8.9 - 09.6 mg/dL   GFR, Estimated >04 >54 mL/min   Anion  gap 10 5 - 15  CBC     Status: Abnormal   Collection Time: 03/22/22  9:19 PM  Result Value Ref Range   WBC 17.5 (H) 4.0 - 10.5 K/uL   RBC 4.58 3.87 - 5.11 MIL/uL   Hemoglobin 13.5 12.0 - 15.0 g/dL   HCT 96.0 45.4 - 09.8 %   MCV 90.4 80.0 - 100.0 fL   MCH 29.5 26.0 - 34.0 pg   MCHC 32.6 30.0 - 36.0 g/dL   RDW 11.9 14.7 - 82.9 %   Platelets 261 150 - 400 K/uL   nRBC 0.0 0.0 - 0.2 %  Troponin I (High Sensitivity)     Status: Abnormal   Collection Time: 03/22/22  9:19 PM  Result Value Ref Range   Troponin I (High Sensitivity) 92 (H) <18 ng/L     Assessment and Plan: * Chest pain -Patient with substernal chest pressure that has come on intermittently for days appears to resolve spontaneously. -typical symptoms suggestive of noncardiac chest pain.  - EKG is abnormal and has st depression in lat leads.  -CXR unremarkable.   -Initial cardiac HS troponin is   Positive at 92   and 2 nd  One has peaked at  84. -EKG not indicative of acute ischemia.   -HEART pathway score is 7, indicating that the patient has an elevated risk score and requires further evaluation. -Will plan to place in inpatient  status on cardiac telemetry  NSTEMI /UA and  admit since the patient has positive troponins and/or an abnormal EKG with angina necessitating acute intervention. -Repeat HS troponin  -Repeat EKG in AM -Start ASA 81 mg daily -Risk factor stratification with HgbA1c and FLP; will also check TSH and UDS -Cardiology consultation in AM- Dr Jones Broom.  -NPO for possible stress test  -Will plan to start Heparin drip if enzymes are positive and/or chest pain recurs     Coronary atherosclerosis of native coronary artery Cont asa/ statin. Imdur, lisinopril, metoprolol and prn  nitroglycerine.   Hyperlipidemia Am lipid panel. Cont atorvastatin.  Diabetes mellitus without complication (HCC) Chronic problem. Glycemic protocol. q 6 hourly accucheck as pt is npo.   Tobacco abuse Nicotine patch.  counseled on tobacco cessation.   Essential hypertension Blood pressure (!) 217/84, pulse 64, temperature 98.3 F (36.8 C), resp. rate 18, height 5\' 3"  (1.6 m), weight 71.8 kg, SpO2 99 %.  cont metoprolol, lisinopril and imdur and prn hydralazine.      Advance Care Planning:    Code Status: Full Code   Consults:  Cardiology: Dr .   Family Communication:  Teressa Lower (Friend)  [phone] (Home Phone)   Severity of Illness: The appropriate patient status for this patient is INPATIENT. Inpatient status is judged to be reasonable and necessary in order to provide the required intensity of service to ensure the patient's safety. The patient's presenting symptoms, physical exam findings, and initial radiographic and laboratory data in the context of their chronic comorbidities is felt to place them at high risk for further clinical deterioration. Furthermore, it is not anticipated that the patient will be medically stable for discharge from the hospital within 2 midnights of admission.   * I certify that at the point of admission it is my clinical judgment that the patient will require inpatient hospital care spanning beyond 2 midnights from the point of admission due to high intensity of service, high risk for further deterioration and high frequency of surveillance required.*  Author: [phone], MD 03/23/2022  1:03 AM  For on call review [email or web address].

## 2022-03-22 NOTE — ED Provider Notes (Signed)
[facility] Provider Note    Event Date/Time   First MD Initiated Contact with Patient 03/22/22 2326     (approximate)   History   Chest Pain   HPI  Laurie Kirby is a 61 y.o. female who presents to the ED from home with a chief complaining of chest pain.  Onset of chest pain yesterday around 2 PM, waxing/waning and worsening in intensity and frequency.  Exacerbated by laying down.  History of CAD status post stents approximately 10 years ago.  Is not currently follow with cardiology.  Denies associated diaphoresis, shortness of breath, nausea/vomiting, palpitations or dizziness.     Past Medical History   Past Medical History:  Diagnosis Date   Diabetes mellitus without complication (HCC)    Hyperlipidemia    Hypertension      Active Problem List   Patient Active Problem List   Diagnosis Date Noted   Chest pain 03/23/2022   Health care maintenance 02/15/2021   History of leukocytosis 08/14/2019   Essential hypertension 12/01/2018   Right hip pain 03/12/2018   Tobacco abuse 09/03/2017   Abnormal Kurowski blood cell (WBC) 08/28/2017   Hyperlipidemia 01/13/2016   Abnormal Pap smear of cervix 11/30/2015   Diabetes mellitus without complication (HCC) 07/27/2015   Coronary atherosclerosis of native coronary artery 07/27/2015   Post-menopausal bleeding 03/15/2014     Past Surgical History   Past Surgical History:  Procedure Laterality Date   CORONARY ANGIOPLASTY WITH STENT PLACEMENT       Home Medications   Prior to Admission medications   Medication Sig Start Date End Date Taking? Authorizing Provider  Ascorbic Acid (VITAMIN C) 1000 MG tablet Take 1,000 mg by mouth daily.   Yes [provider]  ASPIRIN LOW DOSE 81 MG EC tablet TAKE 1 TABLET BY MOUTH DAILY Patient taking differently: Take 81 mg by mouth daily. 06/20/21  Yes [name], [name], NP  atorvastatin (LIPITOR) 40 MG tablet TAKE 2 TABLETS (80 MG TOTAL) BY MOUTH DAILY  01/09/22  Yes [name], [name], NP  clopidogrel (PLAVIX) 75 MG tablet Take 1 tablet (75 mg total) by mouth daily. 11/08/21  Yes [name], [name], NP  glipiZIDE (GLUCOTROL) 10 MG tablet TAKE 2 TABLETS BY MOUTH EVERY DAY BEFOREBREAKFAST Patient taking differently: Take 20 mg by mouth daily before breakfast. TAKE 2 TABLETS BY MOUTH EVERY DAY BEFOREBREAKFAST 02/14/22  Yes [name], [name], NP  isosorbide mononitrate (IMDUR) 30 MG 24 hr tablet TAKE 1 TABLET BY MOUTH DAILY Patient taking differently: Take 30 mg by mouth daily. 03/12/22  Yes [name], [name], NP  lisinopril (ZESTRIL) 40 MG tablet TAKE 1 TABLET BY MOUTH DAILY. PLEASE TAKE THIS IN THE MORNING. Patient taking differently: Take 40 mg by mouth daily. 03/12/22  Yes [name], [name], NP  metFORMIN (GLUCOPHAGE) 1000 MG tablet TAKE 1 AND 1/2 TABLETS BY MOUTH DAILY WITH BREAKFAST Patient taking differently: Take 1,500 mg by mouth daily with breakfast. 03/12/22  Yes [name], [name], NP  metoprolol tartrate (LOPRESSOR) 100 MG tablet TAKE 1 TABLET BY MOUTH DAILY Patient taking differently: Take 100 mg by mouth daily. 03/12/22  Yes [name], [name], NP  famotidine (PEPCID) 20 MG tablet TAKE 1 TABLET BY MOUTH 2 TIMES DAILY AS NEEDED FOR HEARTBURN OR INDIGESTION Patient taking differently: Take 20 mg by mouth 2 (two) times daily. 06/20/21   [name], [name], NP     Allergies  Patient has no known allergies.   Family History  Family History  Problem Relation Age of Onset   Diabetes type II Mother    Diabetes type II Father    Heart disease Father    Cancer Maternal Aunt    Breast cancer Cousin      Physical Exam  Triage Vital Signs: ED Triage Vitals  Enc Vitals Group     BP 03/22/22 2121 (!) 213/83     Pulse Rate 03/22/22 2119 71     Resp 03/22/22 2119 18     Temp 03/22/22 2119 98.3 F (36.8 C)     Temp src --      SpO2 03/22/22 2119 98 %     Weight --      Height --      Head Circumference --       Peak Flow --      Pain Score 03/22/22 2117 5     Pain Loc --      Pain Edu? --      Excl. in GC? --     Updated Vital Signs: BP (!) 171/62   Pulse 78   Temp 98.3 F (36.8 C)   Resp 20   Ht 5\' 3"  (1.6 m)   Wt 71.8 kg   SpO2 99%   BMI 28.04 kg/m    General: Awake, no distress.  CV:  RRR.  Good peripheral perfusion.  Resp:  Normal effort.  CTA B. Abd:  Nontender.  No distention.  Other:  Calves are nonswollen and nontender.   ED Results / Procedures / Treatments  Labs (all labs ordered are listed, but only abnormal results are displayed) Labs Reviewed  BASIC METABOLIC PANEL - Abnormal; Notable for the following components:      Result Value   Glucose, Bld 179 (*)    All other components within normal limits  CBC - Abnormal; Notable for the following components:   WBC 17.5 (*)    All other components within normal limits  TROPONIN I (HIGH SENSITIVITY) - Abnormal; Notable for the following components:   Troponin I (High Sensitivity) 92 (*)    All other components within normal limits  TROPONIN I (HIGH SENSITIVITY) - Abnormal; Notable for the following components:   Troponin I (High Sensitivity) 84 (*)    All other components within normal limits  HEPATIC FUNCTION PANEL  LIPASE, BLOOD  URINALYSIS, ROUTINE W REFLEX MICROSCOPIC  HIV ANTIBODY (ROUTINE TESTING W REFLEX)  LIPOPROTEIN A (LPA)  LIPID PANEL  TROPONIN I (HIGH SENSITIVITY)     EKG  ED ECG REPORT I, Charnese Federici J, the attending physician, personally viewed and interpreted this ECG.   Date: 03/22/2022  EKG Time: 2116  Rate: 73  Rhythm: normal sinus rhythm  Axis: Normal  Intervals:none  ST&T Change: Nonspecific    RADIOLOGY I have independently visualized and interpreted patient's chest x-ray as well as noted the radiology interpretation:  Chest x-ray: No acute cardiopulmonary process  Official radiology report(s): DG Chest 2 View  Result Date: 03/22/2022 CLINICAL DATA:  Chest pain EXAM: CHEST  - 2 VIEW COMPARISON:  07/13/2011 FINDINGS: The heart size and mediastinal contours are within normal limits. Mild atherosclerosis. Both lungs are clear. The visualized skeletal structures are unremarkable. IMPRESSION: No active cardiopulmonary disease. Electronically Signed   By: Jasmine PangKim  Fujinaga M.D.   On: 03/22/2022 21:44     PROCEDURES:  Critical Care performed: Yes CRITICAL CARE Performed by: Irean HongSUNG,Tyneshia Stivers J   Total critical care time: 30 minutes  Critical care time was exclusive of separately  billable procedures and treating other patients.  Critical care was necessary to treat or prevent imminent or life-threatening deterioration.  Critical care was time spent personally by me on the following activities: development of treatment plan with patient and/or surrogate as well as nursing, discussions with consultants, evaluation of patient's response to treatment, examination of patient, obtaining history from patient or surrogate, ordering and performing treatments and interventions, ordering and review of laboratory studies, ordering and review of radiographic studies, pulse oximetry and re-evaluation of patient's condition.   Marland Kitchen1-3 Lead EKG Interpretation Performed by: Irean Hong, MD Authorized by: Irean Hong, MD     Interpretation: normal     ECG rate:  75   ECG rate assessment: normal     Rhythm: sinus rhythm     Ectopy: none     Conduction: normal   Comments:     Patient is on cardiac monitor to evaluate for arrhythmias   MEDICATIONS ORDERED IN ED: Medications  atorvastatin (LIPITOR) tablet 80 mg (has no administration in time range)  aspirin EC tablet 81 mg (has no administration in time range)  clopidogrel (PLAVIX) tablet 75 mg (has no administration in time range)  famotidine (PEPCID) tablet 20 mg (has no administration in time range)  isosorbide mononitrate (IMDUR) 24 hr tablet 30 mg (has no administration in time range)  lisinopril (ZESTRIL) tablet 40 mg (has no  administration in time range)  metoprolol tartrate (LOPRESSOR) tablet 100 mg (has no administration in time range)  sodium chloride flush (NS) 0.9 % injection 3 mL (3 mLs Intravenous Given 03/23/22 0115)  sodium chloride flush (NS) 0.9 % injection 3 mL (has no administration in time range)  0.9 %  sodium chloride infusion (has no administration in time range)  nitroGLYCERIN (NITROSTAT) SL tablet 0.4 mg (0.4 mg Sublingual Given 03/23/22 0359)  acetaminophen (TYLENOL) tablet 650 mg (has no administration in time range)  ondansetron (ZOFRAN) injection 4 mg (has no administration in time range)  lactated ringers infusion ( Intravenous New Bag/Given 03/23/22 0312)  hydrALAZINE (APRESOLINE) injection 10 mg (10 mg Intravenous Given 03/23/22 0125)  insulin aspart (novoLOG) injection 0-9 Units (has no administration in time range)  aspirin chewable tablet 324 mg (324 mg Oral Given 03/23/22 0007)  nitroGLYCERIN (NITROGLYN) 2 % ointment 1 inch (1 inch Topical Given 03/23/22 0008)     IMPRESSION / MDM / ASSESSMENT AND PLAN / ED COURSE  I reviewed the triage vital signs and the nursing notes.                             61 year old female presenting with chest pain.  I have identified patient to have a potentially life-threatening condition. Differential diagnosis includes, but is not limited to, ACS, aortic dissection, pulmonary embolism, cardiac tamponade, pneumothorax, pneumonia, pericarditis, myocarditis, GI-related causes including esophagitis/gastritis, and musculoskeletal chest wall pain.     I have personally reviewed patient's records and see that she had a PCP office visit for diabetes follow-up on 02/14/2022.  The patient is on the cardiac monitor to evaluate for evidence of arrhythmia and/or significant heart rate changes.  Laboratory results demonstrate leukocytosis WBC 17.5, normal electrolytes, elevated initial troponin of 92.  Will administer baby aspirin x4, nitroglycerin paste for  hypertension.  Will consult hospitalist services for evaluation and admission.      FINAL CLINICAL IMPRESSION(S) / ED DIAGNOSES   Final diagnoses:  NSTEMI (non-ST elevated myocardial infarction) (HCC)  Unstable angina (HCC)  Hypertension, unspecified type     Rx / DC Orders   ED Discharge Orders     None        Note:  This document was prepared using Dragon voice recognition software and may include unintentional dictation errors.   Irean Hong, MD 03/23/22 0500

## 2022-03-23 ENCOUNTER — Encounter: Payer: Self-pay | Admitting: Internal Medicine

## 2022-03-23 ENCOUNTER — Inpatient Hospital Stay (HOSPITAL_COMMUNITY)
Admit: 2022-03-23 | Discharge: 2022-03-23 | Disposition: A | Discharge status: Home / Self Care | Payer: Self-pay | Attending: Internal Medicine | Admitting: Internal Medicine

## 2022-03-23 ENCOUNTER — Other Ambulatory Visit: Payer: Self-pay

## 2022-03-23 DIAGNOSIS — R079 Chest pain, unspecified: Secondary | ICD-10-CM | POA: Diagnosis present

## 2022-03-23 DIAGNOSIS — R072 Precordial pain: Secondary | ICD-10-CM

## 2022-03-23 DIAGNOSIS — E782 Mixed hyperlipidemia: Secondary | ICD-10-CM

## 2022-03-23 DIAGNOSIS — I25119 Atherosclerotic heart disease of native coronary artery with unspecified angina pectoris: Secondary | ICD-10-CM

## 2022-03-23 DIAGNOSIS — I214 Non-ST elevation (NSTEMI) myocardial infarction: Secondary | ICD-10-CM

## 2022-03-23 DIAGNOSIS — Z720 Tobacco use: Secondary | ICD-10-CM

## 2022-03-23 DIAGNOSIS — I161 Hypertensive emergency: Principal | ICD-10-CM

## 2022-03-23 LAB — URINALYSIS, ROUTINE W REFLEX MICROSCOPIC
Bilirubin Urine: NEGATIVE
Glucose, UA: NEGATIVE mg/dL
Hgb urine dipstick: NEGATIVE
Ketones, ur: NEGATIVE mg/dL
Nitrite: NEGATIVE
Protein, ur: 30 mg/dL — AB
Specific Gravity, Urine: 1.011 (ref 1.005–1.030)
pH: 6 (ref 5.0–8.0)

## 2022-03-23 LAB — ECHOCARDIOGRAM COMPLETE
AR max vel: 3.37 cm2
AV Area VTI: 3.78 cm2
AV Area mean vel: 3.47 cm2
AV Mean grad: 8 mmHg
AV Peak grad: 16.2 mmHg
Ao pk vel: 2.01 m/s
Area-P 1/2: 4.46 cm2
Calc EF: 64.2 %
Height: 63 in
S' Lateral: 2.6 cm
Single Plane A2C EF: 64.7 %
Single Plane A4C EF: 60.3 %
Weight: 2532.64 oz

## 2022-03-23 LAB — HEPATIC FUNCTION PANEL
ALT: 19 U/L (ref 0–44)
AST: 19 U/L (ref 15–41)
Albumin: 3.6 g/dL (ref 3.5–5.0)
Alkaline Phosphatase: 53 U/L (ref 38–126)
Bilirubin, Direct: <0.1 mg/dL (ref 0.0–0.2)
Total Bilirubin: 0.6 mg/dL (ref 0.3–1.2)
Total Protein: 7.4 g/dL (ref 6.5–8.1)

## 2022-03-23 LAB — CBG MONITORING, ED
Glucose-Capillary: 172 mg/dL — ABNORMAL HIGH (ref 70–99)
Glucose-Capillary: 182 mg/dL — ABNORMAL HIGH (ref 70–99)

## 2022-03-23 LAB — LIPID PANEL
Cholesterol: 129 mg/dL (ref 0–200)
HDL: 50 mg/dL (ref >40)
LDL Cholesterol: 69 mg/dL (ref 0–99)
Total CHOL/HDL Ratio: 2.6 RATIO
Triglycerides: 51 mg/dL (ref <150)
VLDL: 10 mg/dL (ref 0–40)

## 2022-03-23 LAB — SEDIMENTATION RATE: Sed Rate: 17 mm/hr (ref 0–30)

## 2022-03-23 LAB — GLUCOSE, CAPILLARY
Glucose-Capillary: 137 mg/dL — ABNORMAL HIGH (ref 70–99)
Glucose-Capillary: 213 mg/dL — ABNORMAL HIGH (ref 70–99)
Glucose-Capillary: 139 mg/dL — ABNORMAL HIGH (ref 70–99)

## 2022-03-23 LAB — TROPONIN I (HIGH SENSITIVITY)
Troponin I (High Sensitivity): 75 ng/L — ABNORMAL HIGH (ref <18)
Troponin I (High Sensitivity): 84 ng/L — ABNORMAL HIGH (ref <18)

## 2022-03-23 LAB — HIV ANTIBODY (ROUTINE TESTING W REFLEX): HIV Screen 4th Generation wRfx: NONREACTIVE

## 2022-03-23 LAB — LIPASE, BLOOD: Lipase: 29 U/L (ref 11–51)

## 2022-03-23 MED ORDER — INSULIN ASPART 100 UNIT/ML IJ SOLN
0.0000 [IU] | Freq: Three times a day (TID) | INTRAMUSCULAR | Status: DC
  Administered 2022-03-23: 2 [IU] via SUBCUTANEOUS
  Administered 2022-03-23: 1 [IU] via SUBCUTANEOUS
  Administered 2022-03-23 – 2022-03-25 (×5): 2 [IU] via SUBCUTANEOUS
  Filled 2022-03-23 (×7): qty 1

## 2022-03-23 MED ORDER — ACETAMINOPHEN 325 MG PO TABS
650.0000 mg | ORAL_TABLET | ORAL | Status: DC | PRN
  Administered 2022-03-23 – 2022-03-25 (×5): 650 mg via ORAL
  Filled 2022-03-23 (×5): qty 2

## 2022-03-23 MED ORDER — LISINOPRIL 20 MG PO TABS
40.0000 mg | ORAL_TABLET | Freq: Every day | ORAL | Status: DC
  Administered 2022-03-23 – 2022-03-25 (×3): 40 mg via ORAL
  Filled 2022-03-23 (×2): qty 2
  Filled 2022-03-23: qty 4

## 2022-03-23 MED ORDER — METOPROLOL TARTRATE 50 MG PO TABS
100.0000 mg | ORAL_TABLET | Freq: Every day | ORAL | Status: DC
  Administered 2022-03-23 – 2022-03-24 (×2): 100 mg via ORAL
  Filled 2022-03-23 (×2): qty 2

## 2022-03-23 MED ORDER — NICOTINE 14 MG/24HR TD PT24
14.0000 mg | MEDICATED_PATCH | Freq: Every day | TRANSDERMAL | Status: DC
  Administered 2022-03-23: 14 mg via TRANSDERMAL
  Filled 2022-03-23 (×3): qty 1

## 2022-03-23 MED ORDER — ATORVASTATIN CALCIUM 80 MG PO TABS
80.0000 mg | ORAL_TABLET | Freq: Every day | ORAL | Status: DC
  Administered 2022-03-23 – 2022-03-25 (×3): 80 mg via ORAL
  Filled 2022-03-23 (×2): qty 1
  Filled 2022-03-23: qty 4

## 2022-03-23 MED ORDER — ISOSORBIDE MONONITRATE ER 30 MG PO TB24
30.0000 mg | ORAL_TABLET | Freq: Every day | ORAL | Status: DC
  Administered 2022-03-23 – 2022-03-25 (×3): 30 mg via ORAL
  Filled 2022-03-23 (×3): qty 1

## 2022-03-23 MED ORDER — ONDANSETRON HCL 4 MG/2ML IJ SOLN: 4.0000 mg | Freq: Four times a day (QID) | INTRAMUSCULAR | Status: DC | PRN

## 2022-03-23 MED ORDER — SODIUM CHLORIDE 0.9% FLUSH
3.0000 mL | Freq: Two times a day (BID) | INTRAVENOUS | Status: DC
  Administered 2022-03-23 – 2022-03-25 (×4): 3 mL via INTRAVENOUS

## 2022-03-23 MED ORDER — LACTATED RINGERS IV SOLN: INTRAVENOUS | Status: AC

## 2022-03-23 MED ORDER — SODIUM CHLORIDE 0.9 % IV SOLN: 250.0000 mL | INTRAVENOUS | Status: DC | PRN

## 2022-03-23 MED ORDER — CLOPIDOGREL BISULFATE 75 MG PO TABS
75.0000 mg | ORAL_TABLET | Freq: Every day | ORAL | Status: DC
  Administered 2022-03-23 – 2022-03-25 (×3): 75 mg via ORAL
  Filled 2022-03-23 (×3): qty 1

## 2022-03-23 MED ORDER — SODIUM CHLORIDE 0.9% FLUSH: 3.0000 mL | INTRAVENOUS | Status: DC | PRN

## 2022-03-23 MED ORDER — NITROGLYCERIN 0.4 MG SL SUBL
0.4000 mg | SUBLINGUAL_TABLET | SUBLINGUAL | Status: DC | PRN
  Administered 2022-03-23 – 2022-03-25 (×6): 0.4 mg via SUBLINGUAL
  Filled 2022-03-23 (×5): qty 1

## 2022-03-23 MED ORDER — HYDRALAZINE HCL 20 MG/ML IJ SOLN
10.0000 mg | Freq: Four times a day (QID) | INTRAMUSCULAR | Status: DC | PRN
  Administered 2022-03-23 – 2022-03-24 (×4): 10 mg via INTRAVENOUS
  Filled 2022-03-23 (×4): qty 1

## 2022-03-23 MED ORDER — ASPIRIN 81 MG PO TBEC
81.0000 mg | DELAYED_RELEASE_TABLET | Freq: Every day | ORAL | Status: DC
  Administered 2022-03-23 – 2022-03-25 (×3): 81 mg via ORAL
  Filled 2022-03-23 (×3): qty 1

## 2022-03-23 MED ORDER — FAMOTIDINE 20 MG PO TABS
20.0000 mg | ORAL_TABLET | Freq: Two times a day (BID) | ORAL | Status: DC | PRN
  Administered 2022-03-24: 20 mg via ORAL
  Filled 2022-03-23: qty 1

## 2022-03-23 NOTE — ED Notes (Signed)
Pt reporting chest pain 8/10 during echo. EKG repeated and nitro SL given. Will continue to monitor.

## 2022-03-23 NOTE — Plan of Care (Signed)
?  Problem: Education: ?Goal: Knowledge of General Education information will improve ?Description: Including pain rating scale, medication(s)/side effects and non-pharmacologic comfort measures ?Outcome: Progressing ?  ?Problem: Health Behavior/Discharge Planning: ?Goal: Ability to manage health-related needs will improve ?Outcome: Progressing ?  ?Problem: Clinical Measurements: ?Goal: Respiratory complications will improve ?Outcome: Progressing ?Goal: Cardiovascular complication will be avoided ?Outcome: Progressing ?  ?Problem: Activity: ?Goal: Risk for activity intolerance will decrease ?Outcome: Progressing ?  ?Problem: Nutrition: ?Goal: Adequate nutrition will be maintained ?Outcome: Progressing ?  ?Problem: Pain Managment: ?Goal: General experience of comfort will improve ?Outcome: Progressing ?  ?Problem: Safety: ?Goal: Ability to remain free from injury will improve ?Outcome: Progressing ?  ?Problem: Skin Integrity: ?Goal: Risk for impaired skin integrity will decrease ?Outcome: Progressing ?  ?

## 2022-03-23 NOTE — ED Notes (Addendum)
Pt reports CP and SHOB, 7/10. NTG SL 0.4 given and MD notified and aware. EKG performed

## 2022-03-23 NOTE — Assessment & Plan Note (Signed)
Am lipid panel. Cont atorvastatin.

## 2022-03-23 NOTE — Progress Notes (Signed)
Pt arrived to room 259 via bed from the ED. Received report from [place], Charity fundraiser. See assessment. Will continue to monitor.

## 2022-03-23 NOTE — ED Notes (Signed)
Pt reporting worsening of chest pain. Second SL nitro given and provider notified.

## 2022-03-23 NOTE — Assessment & Plan Note (Signed)
Chronic problem. Glycemic protocol. q 6 hourly accucheck as pt is npo.

## 2022-03-23 NOTE — ED Notes (Signed)
Kate RN aware of assigned bed 

## 2022-03-23 NOTE — Progress Notes (Signed)
*  PRELIMINARY RESULTS* Echocardiogram 2D Echocardiogram has been performed.  Laurie Kirby 03/23/2022, 8:41 AM

## 2022-03-23 NOTE — Assessment & Plan Note (Signed)
Nicotine patch.  counseled on tobacco cessation.

## 2022-03-23 NOTE — Assessment & Plan Note (Signed)
Cont asa/ statin. Imdur, lisinopril, metoprolol and prn nitroglycerine.

## 2022-03-23 NOTE — Consult Note (Addendum)
Cardiology Consultation:   Patient ID: Laurie Kirby; [id number]; 16-Jan-1961   Admit date: 03/22/2022 Date of Consult: 03/23/2022  Primary Care Provider: System, Provider Not In Primary Cardiologist: Gollan Primary Electrophysiologist:  None   Patient Profile:   Laurie Kirby is a 61 y.o. female with a hx of CAD status post PCI to the RCA in 05/2011, DM2, HTN, HLD, medication nonadherence per prior documentation, and ongoing tobacco use who is being seen today for the evaluation of chest pain at the request of Dr. Allena KatzPatel.  History of Present Illness:   Laurie Kirby underwent PCI to the RCA in 05/2011.  She underwent stress testing in 06/2011 for recurrent chest pain which showed no ischemia with a normal EF.  No further cardiology records are available for review until she was evaluated in 08/2020 for preoperative cardiac risk stratification for numerous dental extractions.  It was noted at that time she was not taking her medications and had poor blood pressure control.  She has been lost to follow-up since.  [facility] with onset of central chest pain without radiation while playing with kids that lasted for 5 minutes with spontaneous resolution or associated symptoms. She is uncertain if this pain was similar to what she experienced leading up to her PCI in 2012. No recent illnesses, fevers, or chills. She continues to smoke 4 cigarettes daily.  Blood pressure was noted to be poorly controlled at 213/83.  EKG demonstrated NSR, 73 bpm, nonspecific anterolateral ST-T changes that are largely unchanged from tracing in 08/2020.  Initial and peak high-sensitivity troponin of 92 subsequently downtrending to 84 and 75.  She was noted to have a persistent leukocytosis which dates back at least several years.  Chest x-ray showed no active cardiopulmonary disease.  In the ED, she was given ASA 324 mg x 1, SL NTG, Nitropaste, and lactated Ringer's.  Upon admission, cardiology was  asked to evaluate.  Blood pressure remains elevated in the 180s systolic.  Currently, without chest pain or dyspnea.     Past Medical History:  Diagnosis Date   CAD (coronary artery disease)    Diabetes mellitus without complication (HCC)    Hyperlipidemia    Hypertension     Past Surgical History:  Procedure Laterality Date   CORONARY ANGIOPLASTY WITH STENT PLACEMENT       Home Meds: Prior to Admission medications   Medication Sig Start Date End Date Taking? Authorizing Provider  Ascorbic Acid (VITAMIN C) 1000 MG tablet Take 1,000 mg by mouth daily.   Yes [provider]  ASPIRIN LOW DOSE 81 MG EC tablet TAKE 1 TABLET BY MOUTH DAILY Patient taking differently: Take 81 mg by mouth daily. 06/20/21  Yes [name], [name], NP  atorvastatin (LIPITOR) 40 MG tablet TAKE 2 TABLETS (80 MG TOTAL) BY MOUTH DAILY 01/09/22  Yes [name], [name], NP  clopidogrel (PLAVIX) 75 MG tablet Take 1 tablet (75 mg total) by mouth daily. 11/08/21  Yes [name], [name], NP  glipiZIDE (GLUCOTROL) 10 MG tablet TAKE 2 TABLETS BY MOUTH EVERY DAY BEFOREBREAKFAST Patient taking differently: Take 20 mg by mouth daily before breakfast. TAKE 2 TABLETS BY MOUTH EVERY DAY BEFOREBREAKFAST 02/14/22  Yes [name], [name], NP  isosorbide mononitrate (IMDUR) 30 MG 24 hr tablet TAKE 1 TABLET BY MOUTH DAILY Patient taking differently: Take 30 mg by mouth daily. 03/12/22  Yes [name], [name], NP  lisinopril (ZESTRIL) 40 MG tablet TAKE 1 TABLET BY  MOUTH DAILY. PLEASE TAKE THIS IN THE MORNING. Patient taking differently: Take 40 mg by mouth daily. 03/12/22  Yes [name], [name], NP  metFORMIN (GLUCOPHAGE) 1000 MG tablet TAKE 1 AND 1/2 TABLETS BY MOUTH DAILY WITH BREAKFAST Patient taking differently: Take 1,500 mg by mouth daily with breakfast. 03/12/22  Yes [name], [name], NP  metoprolol tartrate (LOPRESSOR) 100 MG tablet TAKE 1 TABLET BY MOUTH DAILY Patient taking differently: Take 100 mg by  mouth daily. 03/12/22  Yes [name], [name], NP  famotidine (PEPCID) 20 MG tablet TAKE 1 TABLET BY MOUTH 2 TIMES DAILY AS NEEDED FOR HEARTBURN OR INDIGESTION Patient taking differently: Take 20 mg by mouth 2 (two) times daily. 06/20/21   [name], [name], NP    Inpatient Medications: Scheduled Meds:  aspirin EC  81 mg Oral Daily   atorvastatin  80 mg Oral Daily   clopidogrel  75 mg Oral Daily   insulin aspart  0-9 Units Subcutaneous TID WC   isosorbide mononitrate  30 mg Oral Daily   lisinopril  40 mg Oral Daily   metoprolol tartrate  100 mg Oral Daily   sodium chloride flush  3 mL Intravenous Q12H   Continuous Infusions:  sodium chloride     lactated ringers 10 mL/hr at 03/23/22 0312   PRN Meds: sodium chloride, acetaminophen, famotidine, hydrALAZINE, nitroGLYCERIN, ondansetron (ZOFRAN) IV, sodium chloride flush  Allergies:  No Known Allergies  Social History:   Social History   Socioeconomic History   Marital status: Single    Spouse name: Not on file   Number of children: 3   Years of education: Not on file   Highest education level: High school graduate  Occupational History   Occupation: unemployed  Tobacco Use   Smoking status: Some Days    Types: Cigarettes   Smokeless tobacco: Never   Tobacco comments:    quit following having teeth removed - pt reports she smokes when she drinks (edited: 08/09/2020=2)  Vaping Use   Vaping Use: Never used  Substance and Sexual Activity   Alcohol use: Yes    Alcohol/week: 1.0 standard drink    Types: 1 Glasses of wine per week    Comment:  wine on the weekends   Drug use: No   Sexual activity: Yes  Other Topics Concern   Not on file  Social History Narrative   A friend supports. Not on food stamps. Pretty well taken care of by friend.    Social Determinants of Health   Financial Resource Strain: Not on file  Food Insecurity: No Food Insecurity   Worried About Programme researcher, broadcasting/film/video in the Last Year: Never true    Ran Out of Food in the Last Year: Never true  Transportation Needs: No Transportation Needs   Lack of Transportation (Medical): No   Lack of Transportation (Non-Medical): No  Physical Activity: Not on file  Stress: Not on file  Social Connections: Not on file  Intimate Partner Violence: Not on file     Family History:   Family History  Problem Relation Age of Onset   Diabetes type II Mother    Diabetes type II Father    Heart disease Father    Cancer Maternal Aunt    Breast cancer Cousin     ROS:  Review of Systems  Constitutional:  Negative for chills, diaphoresis, fever, malaise/fatigue and weight loss.  HENT:  Negative for congestion.   Eyes:  Negative for discharge and redness.  Respiratory:  Negative for cough, sputum production, shortness of breath and wheezing.   Cardiovascular:  Positive for chest pain. Negative for palpitations, orthopnea, claudication, leg swelling and PND.  Gastrointestinal:  Negative for abdominal pain, heartburn, nausea and vomiting.  Musculoskeletal:  Negative for falls and myalgias.  Skin:  Negative for rash.  Neurological:  Negative for dizziness, tingling, tremors, sensory change, speech change, focal weakness, loss of consciousness and weakness.  Endo/Heme/Allergies:  Does not bruise/bleed easily.  Psychiatric/Behavioral:  Negative for substance abuse. The patient is not nervous/anxious.   All other systems reviewed and are negative.    Physical Exam/Data:   Vitals:   03/23/22 0400 03/23/22 0430 03/23/22 0500 03/23/22 0630  BP: (!) 181/94 (!) 171/62 (!) 168/61 (!) 189/88  Pulse: 84 78 71 71  Resp: 19 20 18 18   Temp:      SpO2: 98% 99% 97% 100%  Weight:      Height:       No intake or output data in the 24 hours ending 03/23/22 0716 Filed Weights   03/23/22 0045  Weight: 71.8 kg   Body mass index is 28.04 kg/m.   Physical Exam: General: Well developed, well nourished, in no acute distress. Head: Normocephalic, atraumatic,  sclera non-icteric, no xanthomas, nares without discharge.  Neck: Negative for carotid bruits. JVD not elevated. Lungs: Clear bilaterally to auscultation without wheezes, rales, or rhonchi. Breathing is unlabored. Heart: RRR with S1 S2. No murmurs, rubs, or gallops appreciated. Abdomen: Soft, non-tender, non-distended with normoactive bowel sounds. No hepatomegaly. No rebound/guarding. No obvious abdominal masses. Msk:  Strength and tone appear normal for age. Extremities: No clubbing or cyanosis. No edema. Distal pedal pulses are 2+ and equal bilaterally. Neuro: Alert and oriented X 3. No facial asymmetry. No focal deficit. Moves all extremities spontaneously. Psych:  Responds to questions appropriately with a normal affect.   EKG:  The EKG was personally reviewed and demonstrates: NSR, 73 bpm, anterolateral ST-T changes consistent with prior tracing from 08/2020 Telemetry:  Telemetry was personally reviewed and demonstrates: SR  Weights: 09/2020   03/23/22 0045  Weight: 71.8 kg    Relevant CV Studies:  Lexiscan MPI 07/14/2011: IMPRESSION:  1.  No evidence of infarction or pharmacological inducible  ischemia.  2.  Normal wall motion.  Ejection fraction - 62%.    Laboratory Data:  Chemistry Recent Labs  Lab 03/22/22 2119  NA 141  K 3.6  CL 106  CO2 25  GLUCOSE 179*  BUN 16  CREATININE 0.63  CALCIUM 9.4  GFRNONAA >60  ANIONGAP 10    Recent Labs  Lab 03/23/22 0003  PROT 7.4  ALBUMIN 3.6  AST 19  ALT 19  ALKPHOS 53  BILITOT 0.6   Hematology Recent Labs  Lab 03/22/22 2119  WBC 17.5*  RBC 4.58  HGB 13.5  HCT 41.4  MCV 90.4  MCH 29.5  MCHC 32.6  RDW 12.6  PLT 261   Cardiac EnzymesNo results for input(s): TROPONINI in the last 168 hours. No results for input(s): TROPIPOC in the last 168 hours.  BNPNo results for input(s): BNP, PROBNP in the last 168 hours.  DDimer No results for input(s): DDIMER in the last 168 hours.  Radiology/Studies:  DG  Chest 2 View  Result Date: 03/22/2022 IMPRESSION: No active cardiopulmonary disease. Electronically Signed   By: 03/24/2022 M.D.   On: 03/22/2022 21:44    Assessment and Plan:   1.  CAD involving the native coronary arteries with chest pain and  mildly elevated high-sensitivity troponin: -Currently, chest pain free -EKG is largely unchanged from tracing in 08/2020 -High-sensitivity troponin minimally elevated and flat trending, not consistent with ACS -Suspect supply demand ischemia in the setting of poorly controlled hypertension -Obtain echo with further recommendations pending -ASA, clopidogrel, atorvastatin, metoprolol, isosorbide mononitrate, lisinopril -Aggressive risk factor modification and secondary prevention is encouraged  2.  HTN: -Poorly controlled, prn hydralazine given during consult by RN -Suspect medication compliance issues -She is scheduled to receive lisinopril, metoprolol, and isosorbide mononitrate -Reassess BP thereafter  3.  HLD: -LDL 69 with goal being at least less than 70 -Atorvastatin  4.  DM2: -Most recent A1c 7.9 -Ongoing management per primary service  5.  Ongoing tobacco use: -Complete cessation is recommended  6.  Medication nonadherence: -Compliance encouraged     For questions or updates, please contact [facility] Please consult [email or web address] for contact info under Cardiology/STEMI.   Signed, Eula Listen, PA-C [facility] Pager: [phone] 03/23/2022, 7:16 AM

## 2022-03-23 NOTE — Progress Notes (Addendum)
PROGRESS NOTE    Laurie Kirby  YWV:[id number] DOB: 1961/04/12 DOA: 03/22/2022 PCP: System, Provider Not In    Brief Narrative:   Laurie Kirby is a 61 y.o. female with medical history significant of CAD and stent placed 10 years here at [facility], dm II coming with chest pain . Pt smokes about 4 cig a day. Started yesterday while playing with kids.  Tp mildly elevated.  5/27 this am received message pt with sbp 200's, told nsg to give bp meds at earlier time,  after /now sbp 140's. No cp now. No sob .cardiology was consulted for cp and elevated Tp    Consultants:    Procedures:   Antimicrobials:      Subjective: No dizziness.   Objective: Vitals:   03/23/22 1300 03/23/22 1330 03/23/22 1345 03/23/22 1400  BP: 117/62 139/64  (!) 118/58  Pulse: 65 66  67  Resp: 19 14  18   Temp:      SpO2: 94%  98% 98%  Weight:      Height:       No intake or output data in the 24 hours ending 03/23/22 1433 Filed Weights   03/23/22 0045  Weight: 71.8 kg    Examination: Calm, NAD Cta no w/r Reg s1/s2 no gallop Soft benign +bs No edema Aaoxox3  Mood and affect appropriate in current setting     Data Reviewed: I have personally reviewed following labs and imaging studies  CBC: Recent Labs  Lab 03/22/22 2119  WBC 17.5*  HGB 13.5  HCT 41.4  MCV 90.4  PLT 854   Basic Metabolic Panel: Recent Labs  Lab 03/22/22 2119  NA 141  K 3.6  CL 106  CO2 25  GLUCOSE 179*  BUN 16  CREATININE 0.63  CALCIUM 9.4   GFR: Estimated Creatinine Clearance: 72 mL/min (by C-G formula based on SCr of 0.63 mg/dL). Liver Function Tests: Recent Labs  Lab 03/23/22 0003  AST 19  ALT 19  ALKPHOS 53  BILITOT 0.6  PROT 7.4  ALBUMIN 3.6   Recent Labs  Lab 03/23/22 0003  LIPASE 29   No results for input(s): AMMONIA in the last 168 hours. Coagulation Profile: No results for input(s): INR, PROTIME in the last 168 hours. Cardiac Enzymes: No results for input(s): CKTOTAL, CKMB,  CKMBINDEX, TROPONINI in the last 168 hours. BNP (last 3 results) No results for input(s): PROBNP in the last 8760 hours. HbA1C: No results for input(s): HGBA1C in the last 72 hours. CBG: Recent Labs  Lab 03/23/22 0544 03/23/22 1230  GLUCAP 172* 182*   Lipid Profile: Recent Labs    03/23/22 0535  CHOL 129  HDL 50  LDLCALC 69  TRIG 51  CHOLHDL 2.6   Thyroid Function Tests: No results for input(s): TSH, T4TOTAL, FREET4, T3FREE, THYROIDAB in the last 72 hours. Anemia Panel: No results for input(s): VITAMINB12, FOLATE, FERRITIN, TIBC, IRON, RETICCTPCT in the last 72 hours. Sepsis Labs: No results for input(s): PROCALCITON, LATICACIDVEN in the last 168 hours.  No results found for this or any previous visit (from the past 240 hour(s)).       Radiology Studies: DG Chest 2 View  Result Date: 03/22/2022 CLINICAL DATA:  Chest pain EXAM: CHEST - 2 VIEW COMPARISON:  07/13/2011 FINDINGS: The heart size and mediastinal contours are within normal limits. Mild atherosclerosis. Both lungs are clear. The visualized skeletal structures are unremarkable. IMPRESSION: No active cardiopulmonary disease. Electronically Signed   By: Madie Reno.D.  On: 03/22/2022 21:44   ECHOCARDIOGRAM COMPLETE  Result Date: 03/23/2022    ECHOCARDIOGRAM REPORT   Patient Name:   Laurie Kirby Date of Exam: 03/23/2022 Medical Rec #:  [id number]       Height:       63.0 in Accession #:    [id number]      Weight:       158.3 lb Date of Birth:  Aug 22, 1961       BSA:          1.751 m Patient Age:    57 years        BP:           189/88 mmHg Patient Gender: F               HR:           106 bpm. Exam Location:  [facility] Procedure: 2D Echo Indications:     NSTEMI I21.4  History:         Patient has no prior history of Echocardiogram examinations.  Sonographer:     Kathlen Brunswick RDCS Referring Phys:  [place] Diagnosing Phys: Skeet Latch MD IMPRESSIONS  1. Severe septal hypertrophy measuring 2.2 cm.  Otherwise moderate concentric LVH. Findings consistent with hypertrophic obstructive cardiomyopathy. Apical gradient with peak velocity 3.52 m/s at rest. Peak gradient 49.4 mmHg. Left ventricular ejection fraction, by estimation, is 70 to 75%. The left ventricle has hyperdynamic function. The left ventricle has no regional wall motion abnormalities. There is severe left ventricular hypertrophy. Left ventricular diastolic parameters are consistent with Grade I diastolic dysfunction (impaired relaxation). Elevated left ventricular end-diastolic pressure.  2. Right ventricular systolic function is normal. The right ventricular size is normal.  3. The mitral valve is normal in structure. No evidence of mitral valve regurgitation. No evidence of mitral stenosis.  4. The aortic valve is normal in structure. Aortic valve regurgitation is not visualized. No aortic stenosis is present. FINDINGS  Left Ventricle: Severe septal hypertrophy measuring 2.2 cm. Otherwise moderate concentric LVH. Findings consistent with hypertrophic obstructive cardiomyopathy. Apical gradient with peak velocity 3.52 m/s at rest. Peak gradient 49.4 mmHg. Left ventricular ejection fraction, by estimation, is 70 to 75%. The left ventricle has hyperdynamic function. The left ventricle has no regional wall motion abnormalities. The left ventricular internal cavity size was normal in size. There is severe left ventricular hypertrophy. Left ventricular diastolic parameters are consistent with Grade I diastolic dysfunction (impaired relaxation). Elevated left ventricular end-diastolic pressure. Right Ventricle: The right ventricular size is normal. No increase in right ventricular wall thickness. Right ventricular systolic function is normal. Left Atrium: Left atrial size was normal in size. Right Atrium: Right atrial size was normal in size. Pericardium: There is no evidence of pericardial effusion. Mitral Valve: The mitral valve is normal in structure.  No evidence of mitral valve regurgitation. No evidence of mitral valve stenosis. Tricuspid Valve: The tricuspid valve is normal in structure. Tricuspid valve regurgitation is trivial. No evidence of tricuspid stenosis. Aortic Valve: The aortic valve is normal in structure. Aortic valve regurgitation is not visualized. No aortic stenosis is present. Aortic valve mean gradient measures 8.0 mmHg. Aortic valve peak gradient measures 16.2 mmHg. Aortic valve area, by VTI measures 3.78 cm. Pulmonic Valve: The pulmonic valve was normal in structure. Pulmonic valve regurgitation is trivial. No evidence of pulmonic stenosis. Aorta: The aortic root is normal in size and structure. Venous: The inferior vena cava was not well visualized. IAS/Shunts: No  atrial level shunt detected by color flow Doppler.  LEFT VENTRICLE PLAX 2D LVIDd:         3.90 cm     Diastology LVIDs:         2.60 cm     LV e' medial:    4.35 cm/s LV PW:         1.50 cm     LV E/e' medial:  18.2 LV IVS:        2.20 cm     LV e' lateral:   4.46 cm/s LVOT diam:     2.20 cm     LV E/e' lateral: 17.8 LV SV:         116 LV SV Index:   66 LVOT Area:     3.80 cm  LV Volumes (MOD) LV vol d, MOD A2C: 50.1 ml LV vol d, MOD A4C: 30.2 ml LV vol s, MOD A2C: 17.7 ml LV vol s, MOD A4C: 12.0 ml LV SV MOD A2C:     32.4 ml LV SV MOD A4C:     30.2 ml LV SV MOD BP:      26.0 ml RIGHT VENTRICLE RV Basal diam:  2.60 cm RV S prime:     22.20 cm/s LEFT ATRIUM             Index        RIGHT ATRIUM          Index LA diam:        3.30 cm 1.88 cm/m   RA Area:     9.34 cm LA Vol (A2C):   56.1 ml 32.04 ml/m  RA Volume:   20.20 ml 11.54 ml/m LA Vol (A4C):   33.8 ml 19.31 ml/m LA Biplane Vol: 44.3 ml 25.30 ml/m  AORTIC VALVE                     PULMONIC VALVE AV Area (Vmax):    3.37 cm      PV Vmax:       0.91 m/s AV Area (Vmean):   3.47 cm      PV Peak grad:  3.3 mmHg AV Area (VTI):     3.78 cm AV Vmax:           201.00 cm/s AV Vmean:          127.000 cm/s AV VTI:             0.306 m AV Peak Grad:      16.2 mmHg AV Mean Grad:      8.0 mmHg LVOT Vmax:         178.00 cm/s LVOT Vmean:        116.000 cm/s LVOT VTI:          0.304 m LVOT/AV VTI ratio: 0.99  AORTA Ao Root diam: 3.20 cm Ao Asc diam:  3.00 cm MITRAL VALVE                TRICUSPID VALVE MV Area (PHT): 4.46 cm     TR Peak grad:   18.1 mmHg MV Decel Time: 170 msec     TR Vmax:        213.00 cm/s MV E velocity: 79.30 cm/s MV A velocity: 109.00 cm/s  SHUNTS MV E/A ratio:  0.73         Systemic VTI:  0.30 m  Systemic Diam: 2.20 cm Skeet Latch MD Electronically signed by Skeet Latch MD Signature Date/Time: 03/23/2022/2:29:10 PM    Final         Scheduled Meds:  aspirin EC  81 mg Oral Daily   atorvastatin  80 mg Oral Daily   clopidogrel  75 mg Oral Daily   insulin aspart  0-9 Units Subcutaneous TID WC   isosorbide mononitrate  30 mg Oral Daily   lisinopril  40 mg Oral Daily   metoprolol tartrate  100 mg Oral Daily   sodium chloride flush  3 mL Intravenous Q12H   Continuous Infusions:  sodium chloride     lactated ringers 10 mL/hr at 03/23/22 0312    Assessment & Plan:   Principal Problem:   Chest pain Active Problems:   Coronary atherosclerosis of native coronary artery   Hyperlipidemia   Diabetes mellitus without complication (HCC)   Tobacco abuse   Essential hypertension   Chest pain Hx/o CAD s/p PCI TP mildly elevated, but likely due to demand ischemia Possibly pain due to hypertensive v.s. GERD Cards consulted. Continue aspirin and Plavix for now Check ESR Check echo, if unremarkable consider outpatient stress test. Continue Statin Continue Imdur and beta blk.  Hypertensive emergency Sbp in 200's, given bp meds, now down sbp 140's. Will need to monitor overnight and make adjustments She does not check her bp at home Can consider bisopolol/hctz as combo tablet.  Tobacco abuse Was counseled during the hospitalization Add nicotine  patch  Hyperlipidemia Continue statins LDL at goal  Diabetes mellitus without complication (HCC) X6D 8.8 on 4/20/insulin Continue riss  DVT prophylaxis:  Code Status: Family Communication: Family at bedside Disposition Plan: Back home Status is: Inpatient Remains inpatient appropriate because: IV treatment.        LOS: 0 days   Time spent: 52 min with >50% on coc    Nolberto Hanlon, MD [facility] Pager [phone]  If 7PM-7AM, please contact night-coverage 03/23/2022, 2:33 PM

## 2022-03-23 NOTE — Assessment & Plan Note (Addendum)
-  Patient with substernal chest pressure that has come on intermittently for days appears to resolve spontaneously. -typical symptoms suggestive of noncardiac chest pain.  - EKG is abnormal and has st depression in lat leads.  -CXR unremarkable.   -Initial cardiac HS troponin is   Positive at 92   and 2 nd  One has peaked at  84. -EKG not indicative of acute ischemia.   -HEART pathway score is 7, indicating that the patient has an elevated risk score and requires further evaluation. -Will plan to place in inpatient  status on cardiac telemetry  NSTEMI /UA and  admit since the patient has positive troponins and/or an abnormal EKG with angina necessitating acute intervention. -Repeat HS troponin  -Repeat EKG in AM -Start ASA 81 mg daily, PRN nTg.  -Risk factor stratification with HgbA1c and FLP; will also check TSH and UDS -Cardiology consultation in AM- Dr Sung Amabile.  -NPO for possible stress test  -Will plan to start Heparin drip if enzymes are positive and/or chest pain recurs

## 2022-03-23 NOTE — Progress Notes (Signed)
Pt found to be lying in bed elevated and not in the lowest position. RN educated pt that this is a safety risk. Pt verbalized understanding, stated "I just like to sit high", and refused for bed to be in lowest position. Bed alarm on for safety. Room close to nurse's station. Will continue to monitor.

## 2022-03-23 NOTE — Assessment & Plan Note (Signed)
Blood pressure (!) 217/84, pulse 64, temperature 98.3 F (36.8 C), resp. rate 18, height 5\' 3"  (1.6 m), weight 71.8 kg, SpO2 99 %.  cont metoprolol, lisinopril and imdur and prn hydralazine.

## 2022-03-24 DIAGNOSIS — I214 Non-ST elevation (NSTEMI) myocardial infarction: Secondary | ICD-10-CM

## 2022-03-24 LAB — GLUCOSE, CAPILLARY
Glucose-Capillary: 173 mg/dL — ABNORMAL HIGH (ref 70–99)
Glucose-Capillary: 175 mg/dL — ABNORMAL HIGH (ref 70–99)
Glucose-Capillary: 177 mg/dL — ABNORMAL HIGH (ref 70–99)
Glucose-Capillary: 185 mg/dL — ABNORMAL HIGH (ref 70–99)
Glucose-Capillary: 241 mg/dL — ABNORMAL HIGH (ref 70–99)

## 2022-03-24 MED ORDER — METOPROLOL SUCCINATE ER 100 MG PO TB24
200.0000 mg | ORAL_TABLET | Freq: Every day | ORAL | Status: DC
  Administered 2022-03-25: 200 mg via ORAL
  Filled 2022-03-24: qty 2

## 2022-03-24 MED ORDER — AMLODIPINE BESYLATE 5 MG PO TABS
5.0000 mg | ORAL_TABLET | Freq: Every day | ORAL | Status: DC
  Administered 2022-03-25: 5 mg via ORAL
  Filled 2022-03-24: qty 1

## 2022-03-24 MED ORDER — METOPROLOL TARTRATE 50 MG PO TABS
100.0000 mg | ORAL_TABLET | Freq: Two times a day (BID) | ORAL | Status: AC
  Administered 2022-03-24: 100 mg via ORAL
  Filled 2022-03-24: qty 2

## 2022-03-24 MED ORDER — HYDROCHLOROTHIAZIDE 12.5 MG PO TABS
12.5000 mg | ORAL_TABLET | Freq: Every day | ORAL | Status: DC
  Administered 2022-03-24: 12.5 mg via ORAL
  Filled 2022-03-24: qty 1

## 2022-03-24 NOTE — Progress Notes (Signed)
Progress Note  Patient Name: Laurie GivensBeverly R Kirby Date of Encounter: 03/24/2022  [facility] Cardiologist: None   Subjective   Feeling well.  Reported an episode of left arm numbness.  Inpatient Medications    Scheduled Meds:  aspirin EC  81 mg Oral Daily   atorvastatin  80 mg Oral Daily   clopidogrel  75 mg Oral Daily   hydrochlorothiazide  12.5 mg Oral Daily   insulin aspart  0-9 Units Subcutaneous TID WC   isosorbide mononitrate  30 mg Oral Daily   lisinopril  40 mg Oral Daily   metoprolol tartrate  100 mg Oral Daily   nicotine  14 mg Transdermal Daily   sodium chloride flush  3 mL Intravenous Q12H   Continuous Infusions:  sodium chloride     PRN Meds: sodium chloride, acetaminophen, famotidine, hydrALAZINE, nitroGLYCERIN, ondansetron (ZOFRAN) IV, sodium chloride flush   Vital Signs    Vitals:   03/24/22 0533 03/24/22 0743 03/24/22 0838 03/24/22 1152  BP: (!) 166/91 (!) 176/80 137/80 119/67  Pulse: 81 80 92 64  Resp: 20 15    Temp: 98.2 F (36.8 C) 98.7 F (37.1 C)  98.3 F (36.8 C)  TempSrc: Oral Oral  Oral  SpO2: 99% 99%  99%  Weight:      Height:        Intake/Output Summary (Last 24 hours) at 03/24/2022 1218 Last data filed at 03/24/2022 1007 Gross per 24 hour  Intake 359.33 ml  Output --  Net 359.33 ml      03/23/2022    4:36 PM 03/23/2022   12:45 AM 02/14/2022    6:26 PM  Last 3 Weights  Weight (lbs) 160 lb 7.9 oz 158 lb 4.6 oz 158 lb 14.4 oz  Weight (kg) 72.8 kg 71.8 kg 72.077 kg      Telemetry    Sinus rhythm.  Sinus tachycardia.  PVCs- Personally Reviewed  ECG    03/23/2022: Sinus tachycardia.  Rate 108 bpm.  Borderline LVH with repolarization abnormalities. - Personally Reviewed  Physical Exam   VS:  BP 119/67 (BP Location: Right Arm)   Pulse 64   Temp 98.3 F (36.8 C) (Oral)   Resp 15   Ht 5\' 3"  (1.6 m)   Wt 72.8 kg   SpO2 99%   BMI 28.43 kg/m  , BMI Body mass index is 28.43 kg/m. GENERAL:  Well appearing HEENT: Pupils  equal round and reactive, fundi not visualized, oral mucosa unremarkable NECK:  No jugular venous distention, waveform within normal limits, carotid upstroke brisk and symmetric, no bruits, no thyromegaly LUNGS:  Clear to auscultation bilaterally HEART:  RRR.  PMI not displaced or sustained,S1 and S2 within normal limits, no S3, no S4, no clicks, no rubs, II/VI systolic murmur at the LUSB ABD:  Flat, positive bowel sounds normal in frequency in pitch, no bruits, no rebound, no guarding, no midline pulsatile mass, no hepatomegaly, no splenomegaly EXT:  2 plus pulses throughout, no edema, no cyanosis no clubbing SKIN:  No rashes no nodules NEURO:  Cranial nerves II through XII grossly intact, motor grossly intact throughout PSYCH:  Cognitively intact, oriented to person place and time   Labs    High Sensitivity Troponin:   Recent Labs  Lab 03/22/22 2119 03/23/22 0009 03/23/22 0535  TROPONINIHS 92* 84* 75*     Chemistry Recent Labs  Lab 03/22/22 2119 03/23/22 0003  NA 141  --   K 3.6  --   CL 106  --  CO2 25  --   GLUCOSE 179*  --   BUN 16  --   CREATININE 0.63  --   CALCIUM 9.4  --   PROT  --  7.4  ALBUMIN  --  3.6  AST  --  19  ALT  --  19  ALKPHOS  --  53  BILITOT  --  0.6  GFRNONAA >60  --   ANIONGAP 10  --     Lipids  Recent Labs  Lab 03/23/22 0535  CHOL 129  TRIG 51  HDL 50  LDLCALC 69  CHOLHDL 2.6    Hematology Recent Labs  Lab 03/22/22 2119  WBC 17.5*  RBC 4.58  HGB 13.5  HCT 41.4  MCV 90.4  MCH 29.5  MCHC 32.6  RDW 12.6  PLT 261   Thyroid No results for input(s): TSH, FREET4 in the last 168 hours.  BNPNo results for input(s): BNP, PROBNP in the last 168 hours.  DDimer No results for input(s): DDIMER in the last 168 hours.   Radiology    DG Chest 2 View  Result Date: 03/22/2022 CLINICAL DATA:  Chest pain EXAM: CHEST - 2 VIEW COMPARISON:  07/13/2011 FINDINGS: The heart size and mediastinal contours are within normal limits. Mild  atherosclerosis. Both lungs are clear. The visualized skeletal structures are unremarkable. IMPRESSION: No active cardiopulmonary disease. Electronically Signed   By: Jasmine Pang M.D.   On: 03/22/2022 21:44   ECHOCARDIOGRAM COMPLETE  Result Date: 03/23/2022    ECHOCARDIOGRAM REPORT   Patient Name:   Laurie Kirby Date of Exam: 03/23/2022 Medical Rec #:  [id number]       Height:       63.0 in Accession #:    [id number]      Weight:       158.3 lb Date of Birth:  1961/03/12       BSA:          1.751 m Patient Age:    60 years        BP:           189/88 mmHg Patient Gender: F               HR:           106 bpm. Exam Location:  [facility] Procedure: 2D Echo Indications:     NSTEMI I21.4  History:         Patient has no prior history of Echocardiogram examinations.  Sonographer:     Overton Mam RDCS Referring Phys:  [id number] Eliezer Mccoy PATEL Diagnosing Phys: Chilton Si MD IMPRESSIONS  1. Severe septal hypertrophy measuring 2.2 cm. Otherwise moderate concentric LVH. Findings consistent with hypertrophic obstructive cardiomyopathy. Apical gradient with peak velocity 3.52 m/s at rest. Peak gradient 49.4 mmHg. Left ventricular ejection fraction, by estimation, is 70 to 75%. The left ventricle has hyperdynamic function. The left ventricle has no regional wall motion abnormalities. There is severe left ventricular hypertrophy. Left ventricular diastolic parameters are consistent with Grade I diastolic dysfunction (impaired relaxation). Elevated left ventricular end-diastolic pressure.  2. Right ventricular systolic function is normal. The right ventricular size is normal.  3. The mitral valve is normal in structure. No evidence of mitral valve regurgitation. No evidence of mitral stenosis.  4. The aortic valve is normal in structure. Aortic valve regurgitation is not visualized. No aortic stenosis is present. FINDINGS  Left Ventricle: Severe septal hypertrophy measuring 2.2 cm. Otherwise moderate concentric LVH.  Findings consistent with  hypertrophic obstructive cardiomyopathy. Apical gradient with peak velocity 3.52 m/s at rest. Peak gradient 49.4 mmHg. Left ventricular ejection fraction, by estimation, is 70 to 75%. The left ventricle has hyperdynamic function. The left ventricle has no regional wall motion abnormalities. The left ventricular internal cavity size was normal in size. There is severe left ventricular hypertrophy. Left ventricular diastolic parameters are consistent with Grade I diastolic dysfunction (impaired relaxation). Elevated left ventricular end-diastolic pressure. Right Ventricle: The right ventricular size is normal. No increase in right ventricular wall thickness. Right ventricular systolic function is normal. Left Atrium: Left atrial size was normal in size. Right Atrium: Right atrial size was normal in size. Pericardium: There is no evidence of pericardial effusion. Mitral Valve: The mitral valve is normal in structure. No evidence of mitral valve regurgitation. No evidence of mitral valve stenosis. Tricuspid Valve: The tricuspid valve is normal in structure. Tricuspid valve regurgitation is trivial. No evidence of tricuspid stenosis. Aortic Valve: The aortic valve is normal in structure. Aortic valve regurgitation is not visualized. No aortic stenosis is present. Aortic valve mean gradient measures 8.0 mmHg. Aortic valve peak gradient measures 16.2 mmHg. Aortic valve area, by VTI measures 3.78 cm. Pulmonic Valve: The pulmonic valve was normal in structure. Pulmonic valve regurgitation is trivial. No evidence of pulmonic stenosis. Aorta: The aortic root is normal in size and structure. Venous: The inferior vena cava was not well visualized. IAS/Shunts: No atrial level shunt detected by color flow Doppler.  LEFT VENTRICLE PLAX 2D LVIDd:         3.90 cm     Diastology LVIDs:         2.60 cm     LV e' medial:    4.35 cm/s LV PW:         1.50 cm     LV E/e' medial:  18.2 LV IVS:        2.20 cm     LV  e' lateral:   4.46 cm/s LVOT diam:     2.20 cm     LV E/e' lateral: 17.8 LV SV:         116 LV SV Index:   66 LVOT Area:     3.80 cm  LV Volumes (MOD) LV vol d, MOD A2C: 50.1 ml LV vol d, MOD A4C: 30.2 ml LV vol s, MOD A2C: 17.7 ml LV vol s, MOD A4C: 12.0 ml LV SV MOD A2C:     32.4 ml LV SV MOD A4C:     30.2 ml LV SV MOD BP:      26.0 ml RIGHT VENTRICLE RV Basal diam:  2.60 cm RV S prime:     22.20 cm/s LEFT ATRIUM             Index        RIGHT ATRIUM          Index LA diam:        3.30 cm 1.88 cm/m   RA Area:     9.34 cm LA Vol (A2C):   56.1 ml 32.04 ml/m  RA Volume:   20.20 ml 11.54 ml/m LA Vol (A4C):   33.8 ml 19.31 ml/m LA Biplane Vol: 44.3 ml 25.30 ml/m  AORTIC VALVE                     PULMONIC VALVE AV Area (Vmax):    3.37 cm      PV Vmax:       0.91 m/s  AV Area (Vmean):   3.47 cm      PV Peak grad:  3.3 mmHg AV Area (VTI):     3.78 cm AV Vmax:           201.00 cm/s AV Vmean:          127.000 cm/s AV VTI:            0.306 m AV Peak Grad:      16.2 mmHg AV Mean Grad:      8.0 mmHg LVOT Vmax:         178.00 cm/s LVOT Vmean:        116.000 cm/s LVOT VTI:          0.304 m LVOT/AV VTI ratio: 0.99  AORTA Ao Root diam: 3.20 cm Ao Asc diam:  3.00 cm MITRAL VALVE                TRICUSPID VALVE MV Area (PHT): 4.46 cm     TR Peak grad:   18.1 mmHg MV Decel Time: 170 msec     TR Vmax:        213.00 cm/s MV E velocity: 79.30 cm/s MV A velocity: 109.00 cm/s  SHUNTS MV E/A ratio:  0.73         Systemic VTI:  0.30 m                             Systemic Diam: 2.20 cm Chilton Si MD Electronically signed by Chilton Si MD Signature Date/Time: 03/23/2022/2:29:10 PM    Final     Cardiac Studies   Echo 03/23/22:  1. Severe septal hypertrophy measuring 2.2 cm. Otherwise moderate  concentric LVH. Findings consistent with hypertrophic obstructive  cardiomyopathy. Apical gradient with peak velocity 3.52 m/s at rest. Peak  gradient 49.4 mmHg. Left ventricular ejection  fraction, by estimation, is 70 to  75%. The left ventricle has hyperdynamic  function. The left ventricle has no regional wall motion abnormalities.  There is severe left ventricular hypertrophy. Left ventricular diastolic  parameters are consistent with  Grade I diastolic dysfunction (impaired relaxation). Elevated left  ventricular end-diastolic pressure.   2. Right ventricular systolic function is normal. The right ventricular  size is normal.   3. The mitral valve is normal in structure. No evidence of mitral valve  regurgitation. No evidence of mitral stenosis.   4. The aortic valve is normal in structure. Aortic valve regurgitation is  not visualized. No aortic stenosis is present.   Patient Profile     37F with CAD status post PCI, diabetes, hypertension, hyperlipidemia, tobacco abuse, nonadherence admitted with chest pain.  Assessment & Plan    # Hypertensive emergency:  In the ED she was quite hypertensive with systolics in the 200s.  She reports that she has been taking her medications as prescribed.  Blood pressures today are much better controlled on her home regimen.  Of note, she was taking metoprolol tartrate once a day.  We will switch this to metoprolol succinate 200 mg daily.  Continue lisinopril.  Her blood pressures are quite labile.  HCTZ was added to her regimen.  We will switch this to amlodipine given that her echo shows some hypertrophic obstructive cardiomyopathy and we want to avoid reducing her preload.   # Chest pain: # CAD s/p PCI: # HOCM:  High-sensitivity troponin minimally elevated more consistent with demand ischemia than obstructive coronary disease.  Continue aspirin and clopidogrel.  Her chest pain was worse when lying back and somewhat pleuritic.  It was negative and there is no evidence of any pericardial effusion on echo.  Her echo did show severe hypertrophy and outflow tract obstruction consistent with hypertrophic obstructive cardiomyopathy.  Unclear how much of this is due to  managed hypertension recommend getting an outpatient stress MRI which will evaluate her coronaries and also see if there is any evidence of any infiltrative cardiomyopathy or delayed enhancement concerning for scar that would raise her risk of sudden cardiac death.  She notes that her maternal grandmother had sudden cardiac death but was in her 40s or 29s at the time.  She is also had some PVCs.  Recommend getting an outpatient quantify her PVC burden and assess for NSVT, which also raises her SCD risk.  Increasing metoprolol as above.  Continue atorvastatin.  LDL was 69, suggesting that she has been more adherent with her medication regimen.  [facility] will sign off.   Medication Recommendations: Switch HCTZ to amlodipine.  Change metoprolol to succinate. Other recommendations (labs, testing, etc): Stress MRI Follow up as an outpatient: We will arrange  For questions or updates, please contact [facility] Please consult [email or web address] for contact info under        Signed, Chilton Si, MD  03/24/2022, 12:18 PM

## 2022-03-24 NOTE — Progress Notes (Addendum)
1814: Pt c/o chest pain. Rates 4/10. Describes as a dull ache. BP 176/83. Tylenol admin per pt request. Pt denies shortness of breath, dizziness, blurred vision, numbness/tingling. Nolberto Hanlon, MD aware. New PIV access obtained. PRN hydralazine admin.   1846: post PRN hydralazine admin BP 149/61. Pt denies numbness/tingling. Rates chest pain improved as 1/10.

## 2022-03-24 NOTE — Plan of Care (Signed)

## 2022-03-24 NOTE — Progress Notes (Signed)
PROGRESS NOTE    Susann GivensBeverly R Laventure  ZDG:[id number] DOB: 07-Nov-1960 DOA: 03/22/2022 PCP: System, Provider Not In    Brief Narrative:   Susann GivensBeverly R Chiquito is a 61 y.o. female with medical history significant of CAD and stent placed 10 years here at [facility], dm II coming with chest pain . Pt smokes about 4 cig a day. Started yesterday while playing with kids.  Tp mildly elevated.  5/27 this am received message pt with sbp 200's, told nsg to give bp meds at earlier time,  after /now sbp 140's. No cp now. No sob .cardiology was consulted for cp and elevated Tp  5/28 this morning patient's blood pressure was elevated.  Nursing gave IV hydralazine shortly after that patient was complaining of numbness of her left arm where the IV site was.  We took the IV out and try to place IV on the other arm by IV team but patient refused. She also stated she slept on her left arm. Asked her not do sleep on that side as it may cause these issues.  Consultants:    Procedures:   Antimicrobials:      Subjective: No sob, no cp, no dizziness. Becomes anxious. Daughter on speaker and discussed with her.    Objective: Vitals:   03/24/22 0533 03/24/22 0743 03/24/22 0838 03/24/22 1152  BP: (!) 166/91 (!) 176/80 137/80 119/67  Pulse: 81 80 92 64  Resp: 20 15    Temp: 98.2 F (36.8 C) 98.7 F (37.1 C)  98.3 F (36.8 C)  TempSrc: Oral Oral  Oral  SpO2: 99% 99%  99%  Weight:      Height:        Intake/Output Summary (Last 24 hours) at 03/24/2022 1538 Last data filed at 03/24/2022 1352 Gross per 24 hour  Intake 363 ml  Output --  Net 363 ml   Filed Weights   03/23/22 0045 03/23/22 1636  Weight: 71.8 kg 72.8 kg    Examination: Calm, NAD Cta no w/r Reg s1/s2 no gallop Soft benign +bs No edema Aaoxox3 , no neurologic deficit noted. Able to feel fingers during my exam Mood and affect appropriate in current setting     Data Reviewed: I have personally reviewed following labs and imaging  studies  CBC: Recent Labs  Lab 03/22/22 2119  WBC 17.5*  HGB 13.5  HCT 41.4  MCV 90.4  PLT 261   Basic Metabolic Panel: Recent Labs  Lab 03/22/22 2119  NA 141  K 3.6  CL 106  CO2 25  GLUCOSE 179*  BUN 16  CREATININE 0.63  CALCIUM 9.4   GFR: Estimated Creatinine Clearance: 72.4 mL/min (by C-G formula based on SCr of 0.63 mg/dL). Liver Function Tests: Recent Labs  Lab 03/23/22 0003  AST 19  ALT 19  ALKPHOS 53  BILITOT 0.6  PROT 7.4  ALBUMIN 3.6   Recent Labs  Lab 03/23/22 0003  LIPASE 29   No results for input(s): AMMONIA in the last 168 hours. Coagulation Profile: No results for input(s): INR, PROTIME in the last 168 hours. Cardiac Enzymes: No results for input(s): CKTOTAL, CKMB, CKMBINDEX, TROPONINI in the last 168 hours. BNP (last 3 results) No results for input(s): PROBNP in the last 8760 hours. HbA1C: No results for input(s): HGBA1C in the last 72 hours. CBG: Recent Labs  Lab 03/23/22 2046 03/23/22 2326 03/24/22 0544 03/24/22 0802 03/24/22 1148  GLUCAP 213* 139* 177* 175* 185*   Lipid Profile: Recent Labs    03/23/22  0535  CHOL 129  HDL 50  LDLCALC 69  TRIG 51  CHOLHDL 2.6   Thyroid Function Tests: No results for input(s): TSH, T4TOTAL, FREET4, T3FREE, THYROIDAB in the last 72 hours. Anemia Panel: No results for input(s): VITAMINB12, FOLATE, FERRITIN, TIBC, IRON, RETICCTPCT in the last 72 hours. Sepsis Labs: No results for input(s): PROCALCITON, LATICACIDVEN in the last 168 hours.  No results found for this or any previous visit (from the past 240 hour(s)).       Radiology Studies: DG Chest 2 View  Result Date: 03/22/2022 CLINICAL DATA:  Chest pain EXAM: CHEST - 2 VIEW COMPARISON:  07/13/2011 FINDINGS: The heart size and mediastinal contours are within normal limits. Mild atherosclerosis. Both lungs are clear. The visualized skeletal structures are unremarkable. IMPRESSION: No active cardiopulmonary disease. Electronically  Signed   By: Jasmine Pang M.D.   On: 03/22/2022 21:44   ECHOCARDIOGRAM COMPLETE  Result Date: 03/23/2022    ECHOCARDIOGRAM REPORT   Patient Name:   JUDENE LOGUE Wiers Date of Exam: 03/23/2022 Medical Rec #:  [id number]       Height:       63.0 in Accession #:    [id number]      Weight:       158.3 lb Date of Birth:  1961/05/18       BSA:          1.751 m Patient Age:    60 years        BP:           189/88 mmHg Patient Gender: F               HR:           106 bpm. Exam Location:  [facility] Procedure: 2D Echo Indications:     NSTEMI I21.4  History:         Patient has no prior history of Echocardiogram examinations.  Sonographer:     Overton Mam RDCS Referring Phys:  [id number] Eliezer Mccoy PATEL Diagnosing Phys: Chilton Si MD IMPRESSIONS  1. Severe septal hypertrophy measuring 2.2 cm. Otherwise moderate concentric LVH. Findings consistent with hypertrophic obstructive cardiomyopathy. Apical gradient with peak velocity 3.52 m/s at rest. Peak gradient 49.4 mmHg. Left ventricular ejection fraction, by estimation, is 70 to 75%. The left ventricle has hyperdynamic function. The left ventricle has no regional wall motion abnormalities. There is severe left ventricular hypertrophy. Left ventricular diastolic parameters are consistent with Grade I diastolic dysfunction (impaired relaxation). Elevated left ventricular end-diastolic pressure.  2. Right ventricular systolic function is normal. The right ventricular size is normal.  3. The mitral valve is normal in structure. No evidence of mitral valve regurgitation. No evidence of mitral stenosis.  4. The aortic valve is normal in structure. Aortic valve regurgitation is not visualized. No aortic stenosis is present. FINDINGS  Left Ventricle: Severe septal hypertrophy measuring 2.2 cm. Otherwise moderate concentric LVH. Findings consistent with hypertrophic obstructive cardiomyopathy. Apical gradient with peak velocity 3.52 m/s at rest. Peak gradient 49.4 mmHg. Left ventricular  ejection fraction, by estimation, is 70 to 75%. The left ventricle has hyperdynamic function. The left ventricle has no regional wall motion abnormalities. The left ventricular internal cavity size was normal in size. There is severe left ventricular hypertrophy. Left ventricular diastolic parameters are consistent with Grade I diastolic dysfunction (impaired relaxation). Elevated left ventricular end-diastolic pressure. Right Ventricle: The right ventricular size is normal. No increase in right ventricular wall thickness. Right ventricular systolic function is normal.  Left Atrium: Left atrial size was normal in size. Right Atrium: Right atrial size was normal in size. Pericardium: There is no evidence of pericardial effusion. Mitral Valve: The mitral valve is normal in structure. No evidence of mitral valve regurgitation. No evidence of mitral valve stenosis. Tricuspid Valve: The tricuspid valve is normal in structure. Tricuspid valve regurgitation is trivial. No evidence of tricuspid stenosis. Aortic Valve: The aortic valve is normal in structure. Aortic valve regurgitation is not visualized. No aortic stenosis is present. Aortic valve mean gradient measures 8.0 mmHg. Aortic valve peak gradient measures 16.2 mmHg. Aortic valve area, by VTI measures 3.78 cm. Pulmonic Valve: The pulmonic valve was normal in structure. Pulmonic valve regurgitation is trivial. No evidence of pulmonic stenosis. Aorta: The aortic root is normal in size and structure. Venous: The inferior vena cava was not well visualized. IAS/Shunts: No atrial level shunt detected by color flow Doppler.  LEFT VENTRICLE PLAX 2D LVIDd:         3.90 cm     Diastology LVIDs:         2.60 cm     LV e' medial:    4.35 cm/s LV PW:         1.50 cm     LV E/e' medial:  18.2 LV IVS:        2.20 cm     LV e' lateral:   4.46 cm/s LVOT diam:     2.20 cm     LV E/e' lateral: 17.8 LV SV:         116 LV SV Index:   66 LVOT Area:     3.80 cm  LV Volumes (MOD) LV vol  d, MOD A2C: 50.1 ml LV vol d, MOD A4C: 30.2 ml LV vol s, MOD A2C: 17.7 ml LV vol s, MOD A4C: 12.0 ml LV SV MOD A2C:     32.4 ml LV SV MOD A4C:     30.2 ml LV SV MOD BP:      26.0 ml RIGHT VENTRICLE RV Basal diam:  2.60 cm RV S prime:     22.20 cm/s LEFT ATRIUM             Index        RIGHT ATRIUM          Index LA diam:        3.30 cm 1.88 cm/m   RA Area:     9.34 cm LA Vol (A2C):   56.1 ml 32.04 ml/m  RA Volume:   20.20 ml 11.54 ml/m LA Vol (A4C):   33.8 ml 19.31 ml/m LA Biplane Vol: 44.3 ml 25.30 ml/m  AORTIC VALVE                     PULMONIC VALVE AV Area (Vmax):    3.37 cm      PV Vmax:       0.91 m/s AV Area (Vmean):   3.47 cm      PV Peak grad:  3.3 mmHg AV Area (VTI):     3.78 cm AV Vmax:           201.00 cm/s AV Vmean:          127.000 cm/s AV VTI:            0.306 m AV Peak Grad:      16.2 mmHg AV Mean Grad:      8.0 mmHg LVOT Vmax:  178.00 cm/s LVOT Vmean:        116.000 cm/s LVOT VTI:          0.304 m LVOT/AV VTI ratio: 0.99  AORTA Ao Root diam: 3.20 cm Ao Asc diam:  3.00 cm MITRAL VALVE                TRICUSPID VALVE MV Area (PHT): 4.46 cm     TR Peak grad:   18.1 mmHg MV Decel Time: 170 msec     TR Vmax:        213.00 cm/s MV E velocity: 79.30 cm/s MV A velocity: 109.00 cm/s  SHUNTS MV E/A ratio:  0.73         Systemic VTI:  0.30 m                             Systemic Diam: 2.20 cm Chilton Si MD Electronically signed by Chilton Si MD Signature Date/Time: 03/23/2022/2:29:10 PM    Final         Scheduled Meds:  Melene Muller ON 03/25/2022] amLODipine  5 mg Oral Daily   aspirin EC  81 mg Oral Daily   atorvastatin  80 mg Oral Daily   clopidogrel  75 mg Oral Daily   insulin aspart  0-9 Units Subcutaneous TID WC   isosorbide mononitrate  30 mg Oral Daily   lisinopril  40 mg Oral Daily   [START ON 03/25/2022] metoprolol succinate  200 mg Oral Daily   metoprolol tartrate  100 mg Oral BID   nicotine  14 mg Transdermal Daily   sodium chloride flush  3 mL Intravenous Q12H    Continuous Infusions:  sodium chloride      Assessment & Plan:   Principal Problem:   Chest pain Active Problems:   Coronary atherosclerosis of native coronary artery   Hyperlipidemia   Diabetes mellitus without complication (HCC)   Tobacco abuse   Essential hypertension   NSTEMI (non-ST elevated myocardial infarction) (HCC)   Chest pain Hx/o CAD s/p PCI TP mildly elevated, but likely due to demand ischemia Possibly pain due to hypertensive v.s. GERD Cards consulted. Continue aspirin and Plavix for now 5/28 echo with HCM Dc hctz which was given this am to prevent preload Added amlodipine and metoprolol was changed to Toprol xl by cardiology Stress MRI  as outpatient-cards will arrange Metoprolol was increased to bid and then  xl in am , will add parameters Recommend getting an outpatient quantify her PVC burden and assess for NSVT, which also raises her SCD risk   Hypertensive emergency Sbp in 200's, given bp meds, now down sbp 140's. 5/28 after regimen improving. Labile Changes made by cards above     Tobacco abuse Was counseled during the hospitalization 5/28 continue with nicotine patch  Hyperlipidemia LDL at goal Continue with statin  Diabetes mellitus without complication (HCC) A1c 8.8  Continue riss  DVT prophylaxis:  Code Status: Family Communication: Family  on speaker phone updated Disposition Plan: Back home Status is: Inpatient Remains inpatient appropriate because: need bp control.         LOS: 1 day   Time spent: 35 min with >50% on coc    Lynn Ito, MD [facility] Pager [phone]  If 7PM-7AM, please contact night-coverage 03/24/2022, 3:38 PM

## 2022-03-24 NOTE — Plan of Care (Signed)
  Problem: Pain Managment: Goal: General experience of comfort will improve Outcome: Progressing   Problem: Safety: Goal: Ability to remain free from injury will improve Outcome: Progressing   Problem: Skin Integrity: Goal: Risk for impaired skin integrity will decrease Outcome: Progressing   

## 2022-03-24 NOTE — Progress Notes (Signed)
0743: BP 176/80.   0747: PRN hydralazine admin via PIV left forearm.  3790: pt called out stating left fingers were now completely numb, but able to feel left middle finger only. Pt denies chest pain. Charge RN Amy aware. Lynn Ito, MD aware and to come to bedside to eval. All morning meds admin per Dr. Marylu Lund. RN instructed pt to notify staff if she begins to have chest pain or if numbness in left fingers radiates. Pt verbalized understanding.   2409: BP 137/80.  0900: pt states numbness has radiated from left fingers up to left elbow. Pt denies chest pain. Lynn Ito, MD at bedside.   1024: pt states numbness in left fingers and up to left elbow is now 100% resolved. Pt has full sensation of left upper extremity. Pt denies any numbness in left upper extremity. Pt denies chest pain.   See new order for neuro checks. Will continue to monitor.

## 2022-03-24 NOTE — Progress Notes (Signed)
A consult was placed to the IV Nurse for new iv access;  pt requested to use the right arm only, and to only put the iv on the posterior aspect of the forearm;  no veins noted in that area;  Pt , however, had visible veins noted in the lateral and anterior aspect of the forearm;  pt incredibly anxious, screaming out, after a 22 ga was placed in the forearm on the 1st attempt with a great blood return;  pt insisted that the IV be removed and no further attempts be made.  RN and MD aware.  No iv access at this time.

## 2022-03-25 LAB — LIPOPROTEIN A (LPA): Lipoprotein (a): 131.9 nmol/L — ABNORMAL HIGH (ref <75.0)

## 2022-03-25 LAB — POTASSIUM: Potassium: 3.7 mmol/L (ref 3.5–5.1)

## 2022-03-25 LAB — GLUCOSE, CAPILLARY: Glucose-Capillary: 181 mg/dL — ABNORMAL HIGH (ref 70–99)

## 2022-03-25 MED ORDER — AMLODIPINE BESYLATE 5 MG PO TABS: 5.0000 mg | ORAL_TABLET | Freq: Every day | ORAL | 0 refills | Status: DC

## 2022-03-25 MED ORDER — INSULIN ASPART 100 UNIT/ML IJ SOLN
0.0000 [IU] | Freq: Every day | INTRAMUSCULAR | Status: DC
  Administered 2022-03-25: 2 [IU] via SUBCUTANEOUS
  Filled 2022-03-25: qty 1

## 2022-03-25 MED ORDER — INSULIN ASPART 100 UNIT/ML IJ SOLN: 0.0000 [IU] | Freq: Three times a day (TID) | INTRAMUSCULAR | Status: DC

## 2022-03-25 MED ORDER — METOPROLOL SUCCINATE ER 200 MG PO TB24
200.0000 mg | ORAL_TABLET | Freq: Every day | ORAL | 0 refills | Status: DC
Start: 2022-03-26 — End: 2022-04-12

## 2022-03-25 NOTE — TOC Initial Note (Signed)
Transition of Care [facility]) - Initial/Assessment Note    Patient Details  Name: Laurie Kirby MRN: [id number] Date of Birth: 1961/01/04  [facility]) CM/SW Contact:    Laurena Slimmer, RN Phone Number: 03/25/2022, 9:18 AM  Clinical Narrative:                  Transition of Care [facility]) Screening Note   Patient Details  Name: Laurie Kirby Date of Birth: 1961/07/25   Transition of Care [facility]) CM/SW Contact:    Laurena Slimmer, RN Phone Number: 03/25/2022, 9:36 AM    Transition of Care Department [facility]) has reviewed patient and no TOC needs have been identified at this time. We will continue to monitor patient advancement through interdisciplinary progression rounds. If new patient transition needs arise, please place a TOC consult.          Patient Goals and CMS Choice        Expected Discharge Plan and Services                                                Prior Living Arrangements/Services                       Activities of Daily Living Home Assistive Devices/Equipment: Eyeglasses ADL Screening (condition at time of admission) Patient's cognitive ability adequate to safely complete daily activities?: Yes Is the patient deaf or have difficulty hearing?: No Does the patient have difficulty seeing, even when wearing glasses/contacts?: No Does the patient have difficulty concentrating, remembering, or making decisions?: No Patient able to express need for assistance with ADLs?: Yes Does the patient have difficulty dressing or bathing?: No Independently performs ADLs?: Yes (appropriate for developmental age) Does the patient have difficulty walking or climbing stairs?: No Weakness of Legs: None Weakness of Arms/Hands: None  Permission Sought/Granted                  Emotional Assessment              Admission diagnosis:  Unstable angina ([place]) [I20.0] NSTEMI (non-ST elevated myocardial infarction) ([place]) [I21.4] Chest  pain [R07.9] Hypertension, unspecified type [I10] Patient Active Problem List   Diagnosis Date Noted   NSTEMI (non-ST elevated myocardial infarction) ([place])    Chest pain 03/23/2022   Health care maintenance 02/15/2021   History of leukocytosis 08/14/2019   Essential hypertension 12/01/2018   Right hip pain 03/12/2018   Tobacco abuse 09/03/2017   Abnormal [place] blood cell (WBC) 08/28/2017   Hyperlipidemia 01/13/2016   Abnormal Pap smear of cervix 11/30/2015   Diabetes mellitus without complication ([place]) [id number]   Coronary atherosclerosis of native coronary artery 07/27/2015   Post-menopausal bleeding 03/15/2014   PCP:  System, Provider Not In Pharmacy:   [place], [place] - [address] [place] [address] Phone: [phone] Fax: [phone]     Social Determinants of Health (SDOH) Interventions    Readmission Risk Interventions     View : No data to display.

## 2022-03-25 NOTE — Discharge Summary (Signed)
Laurie Kirby ZOX:[id number] DOB: February 22, 1961 DOA: 03/22/2022  PCP: System, Provider Not In  Admit date: 03/22/2022 Discharge date: 03/25/2022  Admitted From: home Disposition:  home  Recommendations for Outpatient Follow-up:  Follow up with PCP in 1 week Please obtain BMP/CBC in one week Please follow up with cardiology in 1 week     Discharge Condition:Stable CODE STATUS:full  Diet recommendation: Heart Healthy / Carb Modified  Brief/Interim Summary: Per HPI: Laurie GivensBeverly R Madkins is a 61 y.o. female with medical history significant of CAD and stent placed 10 years here at [facility], dm II coming with chest pain .Her chest pain started when playing with her grandchildren.  Is been ongoing off and on for the last 2 days.  She was found with hypertensive emergency.  Cardiology was consulted.  She underwent echocardiogram revealing findings of hypertrophic obstructive cardiomyopathy.  Troponin was minimally elevated more consistent with demand ischemia.  There were no pericardial effusion on echo.  Once her blood pressure was controlled she was cleared for discharge.  She needs to follow-up with cardiology to have outpatient stress MRI which will evaluate her coronaries and also see if there is any evidence of any infiltrative cardiomyopathy or delayed enhancement concerning for scar that would raise her risk of sudden cardiac death.  She is stable for discharge today.   Chest pain Hx/o CAD s/p PCI TP mildly elevated, but likely due to demand ischemia Possibly pain due to hypertensive v.s. GERD Cards consulted. Continue aspirin and Plavix   echo with HCM Dc hctz which was given this am to prevent preload Added amlodipine and metoprolol was changed to Toprol xl  Stress MRI  as outpatient-cardiology  will arrange Recommend getting an outpatient quantify her PVC burden and assess for NSVT, which also raises her SCD risk     Hypertensive emergency On admission Improved with medication  adjustments F/u with cardiology for further managment       Tobacco abuse Was counseled during the hospitalization     Hyperlipidemia LDL at goal Continue with statin   Diabetes mellitus without complication (HCC) A1c 8.8  Continue home regimen F/u pcp for further management   Discharge Diagnoses:  Principal Problem:   Chest pain Active Problems:   Coronary atherosclerosis of native coronary artery   Hyperlipidemia   Diabetes mellitus without complication (HCC)   Tobacco abuse   Essential hypertension   NSTEMI (non-ST elevated myocardial infarction) [facility](HCC)    Discharge Instructions  Discharge Instructions     Diet - low sodium heart healthy   Complete by: As directed    Discharge instructions   Complete by: As directed    Follow up with cardiology with Dr. Chilton Siiffany [place]  F/u with pcp  Keep hydrated during the day   Increase activity slowly   Complete by: As directed       Allergies as of 03/25/2022   No Known Allergies      Medication List     STOP taking these medications    metoprolol tartrate 100 MG tablet Commonly known as: LOPRESSOR       TAKE these medications    amLODipine 5 MG tablet Commonly known as: NORVASC Take 1 tablet (5 mg total) by mouth daily. Start taking on: Mar 26, 2022   Aspirin Low Dose 81 MG tablet Generic drug: aspirin EC TAKE 1 TABLET BY MOUTH DAILY What changed: how much to take   atorvastatin 40 MG tablet Commonly known as: LIPITOR TAKE 2 TABLETS (80 MG TOTAL)  BY MOUTH DAILY   clopidogrel 75 MG tablet Commonly known as: PLAVIX Take 1 tablet (75 mg total) by mouth daily.   famotidine 20 MG tablet Commonly known as: PEPCID TAKE 1 TABLET BY MOUTH 2 TIMES DAILY AS NEEDED FOR HEARTBURN OR INDIGESTION What changed: See the new instructions.   glipiZIDE 10 MG tablet Commonly known as: GLUCOTROL TAKE 2 TABLETS BY MOUTH EVERY DAY BEFOREBREAKFAST What changed:  how much to take how to take this when to  take this   isosorbide mononitrate 30 MG 24 hr tablet Commonly known as: IMDUR TAKE 1 TABLET BY MOUTH DAILY   lisinopril 40 MG tablet Commonly known as: ZESTRIL TAKE 1 TABLET BY MOUTH DAILY. PLEASE TAKE THIS IN THE MORNING. What changed: See the new instructions.   metFORMIN 1000 MG tablet Commonly known as: GLUCOPHAGE TAKE 1 AND 1/2 TABLETS BY MOUTH DAILY WITH BREAKFAST What changed: See the new instructions.   metoprolol 200 MG 24 hr tablet Commonly known as: TOPROL-XL Take 1 tablet (200 mg total) by mouth daily. Take with or immediately following a meal. Start taking on: Mar 26, 2022   vitamin C 1000 MG tablet Take 1,000 mg by mouth daily.        Follow-up Information     Chilton Si, MD Follow up in 1 week(s).   Specialty: Cardiology Contact information: [address] [place] [address] [phone]                No Known Allergies  Consultations: Cardiology   Procedures/Studies: DG Chest 2 View  Result Date: 03/22/2022 CLINICAL DATA:  Chest pain EXAM: CHEST - 2 VIEW COMPARISON:  07/13/2011 FINDINGS: The heart size and mediastinal contours are within normal limits. Mild atherosclerosis. Both lungs are clear. The visualized skeletal structures are unremarkable. IMPRESSION: No active cardiopulmonary disease. Electronically Signed   By: Jasmine Pang M.D.   On: 03/22/2022 21:44   ECHOCARDIOGRAM COMPLETE  Result Date: 03/23/2022    ECHOCARDIOGRAM REPORT   Patient Name:   Laurie Kirby Date of Exam: 03/23/2022 Medical Rec #:  [id number]       Height:       63.0 in Accession #:    [id number]      Weight:       158.3 lb Date of Birth:  21-Jul-1961       BSA:          1.751 m Patient Age:    60 years        BP:           189/88 mmHg Patient Gender: F               HR:           106 bpm. Exam Location:  [facility] Procedure: 2D Echo Indications:     NSTEMI I21.4  History:         Patient has no prior history of Echocardiogram examinations.   Sonographer:     Overton Mam RDCS Referring Phys:  [id number] Eliezer Mccoy PATEL Diagnosing Phys: Chilton Si MD IMPRESSIONS  1. Severe septal hypertrophy measuring 2.2 cm. Otherwise moderate concentric LVH. Findings consistent with hypertrophic obstructive cardiomyopathy. Apical gradient with peak velocity 3.52 m/s at rest. Peak gradient 49.4 mmHg. Left ventricular ejection fraction, by estimation, is 70 to 75%. The left ventricle has hyperdynamic function. The left ventricle has no regional wall motion abnormalities. There is severe left ventricular hypertrophy. Left ventricular diastolic parameters are consistent with Grade  I diastolic dysfunction (impaired relaxation). Elevated left ventricular end-diastolic pressure.  2. Right ventricular systolic function is normal. The right ventricular size is normal.  3. The mitral valve is normal in structure. No evidence of mitral valve regurgitation. No evidence of mitral stenosis.  4. The aortic valve is normal in structure. Aortic valve regurgitation is not visualized. No aortic stenosis is present. FINDINGS  Left Ventricle: Severe septal hypertrophy measuring 2.2 cm. Otherwise moderate concentric LVH. Findings consistent with hypertrophic obstructive cardiomyopathy. Apical gradient with peak velocity 3.52 m/s at rest. Peak gradient 49.4 mmHg. Left ventricular ejection fraction, by estimation, is 70 to 75%. The left ventricle has hyperdynamic function. The left ventricle has no regional wall motion abnormalities. The left ventricular internal cavity size was normal in size. There is severe left ventricular hypertrophy. Left ventricular diastolic parameters are consistent with Grade I diastolic dysfunction (impaired relaxation). Elevated left ventricular end-diastolic pressure. Right Ventricle: The right ventricular size is normal. No increase in right ventricular wall thickness. Right ventricular systolic function is normal. Left Atrium: Left atrial size was normal in  size. Right Atrium: Right atrial size was normal in size. Pericardium: There is no evidence of pericardial effusion. Mitral Valve: The mitral valve is normal in structure. No evidence of mitral valve regurgitation. No evidence of mitral valve stenosis. Tricuspid Valve: The tricuspid valve is normal in structure. Tricuspid valve regurgitation is trivial. No evidence of tricuspid stenosis. Aortic Valve: The aortic valve is normal in structure. Aortic valve regurgitation is not visualized. No aortic stenosis is present. Aortic valve mean gradient measures 8.0 mmHg. Aortic valve peak gradient measures 16.2 mmHg. Aortic valve area, by VTI measures 3.78 cm. Pulmonic Valve: The pulmonic valve was normal in structure. Pulmonic valve regurgitation is trivial. No evidence of pulmonic stenosis. Aorta: The aortic root is normal in size and structure. Venous: The inferior vena cava was not well visualized. IAS/Shunts: No atrial level shunt detected by color flow Doppler.  LEFT VENTRICLE PLAX 2D LVIDd:         3.90 cm     Diastology LVIDs:         2.60 cm     LV e' medial:    4.35 cm/s LV PW:         1.50 cm     LV E/e' medial:  18.2 LV IVS:        2.20 cm     LV e' lateral:   4.46 cm/s LVOT diam:     2.20 cm     LV E/e' lateral: 17.8 LV SV:         116 LV SV Index:   66 LVOT Area:     3.80 cm  LV Volumes (MOD) LV vol d, MOD A2C: 50.1 ml LV vol d, MOD A4C: 30.2 ml LV vol s, MOD A2C: 17.7 ml LV vol s, MOD A4C: 12.0 ml LV SV MOD A2C:     32.4 ml LV SV MOD A4C:     30.2 ml LV SV MOD BP:      26.0 ml RIGHT VENTRICLE RV Basal diam:  2.60 cm RV S prime:     22.20 cm/s LEFT ATRIUM             Index        RIGHT ATRIUM          Index LA diam:        3.30 cm 1.88 cm/m   RA Area:     9.34 cm LA  Vol [facility]):   56.1 ml 32.04 ml/m  RA Volume:   20.20 ml 11.54 ml/m LA Vol (A4C):   33.8 ml 19.31 ml/m LA Biplane Vol: 44.3 ml 25.30 ml/m  AORTIC VALVE                     PULMONIC VALVE AV Area (Vmax):    3.37 cm      PV Vmax:       0.91 m/s  AV Area (Vmean):   3.47 cm      PV Peak grad:  3.3 mmHg AV Area (VTI):     3.78 cm AV Vmax:           201.00 cm/s AV Vmean:          127.000 cm/s AV VTI:            0.306 m AV Peak Grad:      16.2 mmHg AV Mean Grad:      8.0 mmHg LVOT Vmax:         178.00 cm/s LVOT Vmean:        116.000 cm/s LVOT VTI:          0.304 m LVOT/AV VTI ratio: 0.99  AORTA Ao Root diam: 3.20 cm Ao Asc diam:  3.00 cm MITRAL VALVE                TRICUSPID VALVE MV Area (PHT): 4.46 cm     TR Peak grad:   18.1 mmHg MV Decel Time: 170 msec     TR Vmax:        213.00 cm/s MV E velocity: 79.30 cm/s MV A velocity: 109.00 cm/s  SHUNTS MV E/A ratio:  0.73         Systemic VTI:  0.30 m                             Systemic Diam: 2.20 cm Chilton Si MD Electronically signed by Chilton Si MD Signature Date/Time: 03/23/2022/2:29:10 PM    Final       Subjective: No chest pain or shortness of breath  Discharge Exam: Vitals:   03/25/22 0229 03/25/22 0815  BP: (!) 168/72 (!) 170/72  Pulse: (!) 58 71  Resp: 14 16  Temp:  98.1 F (36.7 C)  SpO2: 98% 99%   Vitals:   03/24/22 2008 03/24/22 2340 03/25/22 0229 03/25/22 0815  BP: (!) 144/67 (!) 149/67 (!) 168/72 (!) 170/72  Pulse: 71 (!) 58 (!) 58 71  Resp: Temp: 98.4 F (36.9 C) 98.3 F (36.8 C)  98.1 F (36.7 C)  TempSrc: Oral Oral    SpO2: 96% 99% 98% 99%  Weight:      Height:        General: Pt is alert, awake, not in acute distress Cardiovascular: RRR, S1/S2 +, no rubs, no gallops Respiratory: CTA bilaterally, no wheezing, no rhonchi Abdominal: Soft, NT, ND, bowel sounds + Extremities: no edema, no cyanosis    The results of significant diagnostics from this hospitalization (including imaging, microbiology, ancillary and laboratory) are listed below for reference.     Microbiology: No results found for this or any previous visit (from the past 240 hour(s)).   Labs: BNP (last 3 results) No results for input(s): BNP in the last 8760  hours. Basic Metabolic Panel: Recent Labs  Lab 03/22/22 2119 03/25/22 0313  NA 141  --  K 3.6 3.7  CL 106  --   CO2 25  --   GLUCOSE 179*  --   BUN 16  --   CREATININE 0.63  --   CALCIUM 9.4  --    Liver Function Tests: Recent Labs  Lab 03/23/22 0003  AST 19  ALT 19  ALKPHOS 53  BILITOT 0.6  PROT 7.4  ALBUMIN 3.6   Recent Labs  Lab 03/23/22 0003  LIPASE 29   No results for input(s): AMMONIA in the last 168 hours. CBC: Recent Labs  Lab 03/22/22 2119  WBC 17.5*  HGB 13.5  HCT 41.4  MCV 90.4  PLT 261   Cardiac Enzymes: No results for input(s): CKTOTAL, CKMB, CKMBINDEX, TROPONINI in the last 168 hours. BNP: Invalid input(s): POCBNP CBG: Recent Labs  Lab 03/24/22 0802 03/24/22 1148 03/24/22 1653 03/24/22 2342 03/25/22 0819  GLUCAP 175* 185* 173* 241* 181*   D-Dimer No results for input(s): DDIMER in the last 72 hours. Hgb A1c No results for input(s): HGBA1C in the last 72 hours. Lipid Profile Recent Labs    03/23/22 0535  CHOL 129  HDL 50  LDLCALC 69  TRIG 51  CHOLHDL 2.6   Thyroid function studies No results for input(s): TSH, T4TOTAL, T3FREE, THYROIDAB in the last 72 hours.  Invalid input(s): FREET3 Anemia work up No results for input(s): VITAMINB12, FOLATE, FERRITIN, TIBC, IRON, RETICCTPCT in the last 72 hours. Urinalysis    Component Value Date/Time   COLORURINE YELLOW (A) 03/23/2022 0535   APPEARANCEUR CLEAR (A) 03/23/2022 0535   LABSPEC 1.011 03/23/2022 0535   PHURINE 6.0 03/23/2022 0535   GLUCOSEU NEGATIVE 03/23/2022 0535   HGBUR NEGATIVE 03/23/2022 0535   BILIRUBINUR NEGATIVE 03/23/2022 0535   KETONESUR NEGATIVE 03/23/2022 0535   PROTEINUR 30 (A) 03/23/2022 0535   NITRITE NEGATIVE 03/23/2022 0535   LEUKOCYTESUR SMALL (A) 03/23/2022 0535   Sepsis Labs Invalid input(s): PROCALCITONIN,  WBC,  LACTICIDVEN Microbiology No results found for this or any previous visit (from the past 240 hour(s)).   Time coordinating  discharge: Over 30 minutes  SIGNED:   Lynn Ito, MD  [facility] 03/25/2022, 9:19 AM Pager   If 7PM-7AM, please contact night-coverage [email or web address] Password [email or web address]

## 2022-03-25 NOTE — Progress Notes (Signed)
Patient discharged, all belongings with pt. 

## 2022-03-26 ENCOUNTER — Telehealth: Payer: Self-pay | Admitting: *Deleted

## 2022-03-26 NOTE — Telephone Encounter (Signed)
Sending this to scheduling for hospital follow up appointment with Dr. Mariah Milling or next available APP. When patient comes in for appointment further testing is to be discussed at that time.   Requests received as follows:   Eula Listen PA-C  Schedule hospital follow up. Recommended testing can be completed by provider following up with the patient.    Per Dr. Duke Salvia  Patient needs stress MRI for chest pain and new diagnosis hypertrophic obstructive cardiomyopathy.  Also needs a 48-hour ZIO or Holter for NSVT assessment

## 2022-03-26 NOTE — Telephone Encounter (Signed)
Scheduled

## 2022-03-28 NOTE — Telephone Encounter (Signed)
Home number was fax machine. Called mobile number and left voicemail message to call back for confirmation of appointment and to inquire if any questions regarding discharge instructions.

## 2022-04-01 NOTE — Telephone Encounter (Signed)
Yes . Scheduled with patient .

## 2022-04-01 NOTE — Telephone Encounter (Signed)
Noted  

## 2022-04-12 ENCOUNTER — Other Ambulatory Visit: Payer: Self-pay | Admitting: *Deleted

## 2022-04-12 ENCOUNTER — Ambulatory Visit: Payer: Self-pay

## 2022-04-12 ENCOUNTER — Ambulatory Visit (INDEPENDENT_AMBULATORY_CARE_PROVIDER_SITE_OTHER): Payer: Self-pay | Admitting: Medical

## 2022-04-12 ENCOUNTER — Other Ambulatory Visit: Payer: Self-pay | Admitting: Gerontology

## 2022-04-12 ENCOUNTER — Encounter: Payer: Self-pay | Admitting: Medical

## 2022-04-12 ENCOUNTER — Other Ambulatory Visit
Admission: RE | Admit: 2022-04-12 | Discharge: 2022-04-12 | Disposition: A | Discharge status: Home / Self Care | Payer: Self-pay | Source: Ambulatory Visit | Attending: Medical | Admitting: Medical

## 2022-04-12 VITALS — BP 110/56 | HR 60 | Ht 63.0 in | Wt 157.0 lb

## 2022-04-12 DIAGNOSIS — I25119 Atherosclerotic heart disease of native coronary artery with unspecified angina pectoris: Secondary | ICD-10-CM

## 2022-04-12 DIAGNOSIS — I4729 Other ventricular tachycardia: Secondary | ICD-10-CM

## 2022-04-12 DIAGNOSIS — I421 Obstructive hypertrophic cardiomyopathy: Secondary | ICD-10-CM | POA: Insufficient documentation

## 2022-04-12 DIAGNOSIS — E782 Mixed hyperlipidemia: Secondary | ICD-10-CM

## 2022-04-12 DIAGNOSIS — I493 Ventricular premature depolarization: Secondary | ICD-10-CM

## 2022-04-12 DIAGNOSIS — R7989 Other specified abnormal findings of blood chemistry: Secondary | ICD-10-CM

## 2022-04-12 DIAGNOSIS — R778 Other specified abnormalities of plasma proteins: Secondary | ICD-10-CM

## 2022-04-12 DIAGNOSIS — I10 Essential (primary) hypertension: Secondary | ICD-10-CM

## 2022-04-12 DIAGNOSIS — E785 Hyperlipidemia, unspecified: Secondary | ICD-10-CM

## 2022-04-12 DIAGNOSIS — K219 Gastro-esophageal reflux disease without esophagitis: Secondary | ICD-10-CM

## 2022-04-12 DIAGNOSIS — I214 Non-ST elevation (NSTEMI) myocardial infarction: Secondary | ICD-10-CM

## 2022-04-12 DIAGNOSIS — Z720 Tobacco use: Secondary | ICD-10-CM

## 2022-04-12 LAB — HEMOGLOBIN AND HEMATOCRIT, BLOOD
HCT: 36.5 % (ref 36.0–46.0)
Hemoglobin: 12 g/dL (ref 12.0–15.0)

## 2022-04-12 MED ORDER — FAMOTIDINE 20 MG PO TABS: 20.0000 mg | ORAL_TABLET | Freq: Two times a day (BID) | ORAL | 1 refills | Status: DC | PRN

## 2022-04-12 MED ORDER — LISINOPRIL 40 MG PO TABS: 40.0000 mg | ORAL_TABLET | Freq: Every day | ORAL | 1 refills | Status: DC

## 2022-04-12 MED ORDER — METOPROLOL SUCCINATE ER 200 MG PO TB24: 200.0000 mg | ORAL_TABLET | Freq: Every day | ORAL | 1 refills | Status: DC

## 2022-04-12 MED ORDER — AMLODIPINE BESYLATE 5 MG PO TABS: 5.0000 mg | ORAL_TABLET | Freq: Every day | ORAL | 1 refills | Status: DC

## 2022-04-12 MED ORDER — CLOPIDOGREL BISULFATE 75 MG PO TABS: 75.0000 mg | ORAL_TABLET | Freq: Every day | ORAL | 1 refills | Status: DC

## 2022-04-12 MED ORDER — ATORVASTATIN CALCIUM 40 MG PO TABS: 80.0000 mg | ORAL_TABLET | Freq: Every day | ORAL | 1 refills | Status: DC

## 2022-04-12 NOTE — Progress Notes (Signed)
Cardiology Office Note:    Date:  04/12/2022   ID:  AIREN DALES, DOB 1961-10-14, MRN [id number]  PCP:  System, Provider Not In  [facility] Cardiologist: New-[facility] Electrophysiologist:  None   Referring MD: No ref. provider found   Chief Complaint: Hospital follow-up  History of Present Illness:    Laurie Kirby is a 61 y.o. female with a hx of CAD s/p PCI RCA 05/2011, diabetes, HTN, HLD, tobacco use, noncompliance, and recently diagnosed HOCM being seen for hospital follow-up.   She was admitted 03/23/22 for chest pain. BP was severely elevated with systolics in the 200s. HS troponin ws minimally elevated to 92. Echo showed LVEF 70-75%,  severe hypertrophy and outflow tract obstruction consistent with HOCM. Recommended outpatient stress MRI to evaluated coronary arteries and infiltrative disease. It was also noted she had NSVT and PVCs. Bps improved She was sent home on aspirin, Plavix, amlodipine, metoprolol, lisinopril, and Imdur.  The patient has been doing well since being home. Echo from the hospitalization was reviewed. She has occasional chest pain at night that is resolved with Tylenol. It is not worse with exertion. No Shortness of breath, LLE, orthopnea, pnd, lightheadedness, dizziness. No syncope or pre-syncope. BP today is good. EKG shows NSR with a heart rate of 60bpm. She is Ok with stress MRI and heart monitor.    Past Medical History:  Diagnosis Date   CAD (coronary artery disease)    Diabetes mellitus without complication (HCC)    Hyperlipidemia    Hypertension     Past Surgical History:  Procedure Laterality Date   CORONARY ANGIOPLASTY WITH STENT PLACEMENT      Current Medications: Current Meds  Medication Sig   amLODipine (NORVASC) 5 MG tablet Take 1 tablet (5 mg total) by mouth daily.   Ascorbic Acid (VITAMIN C) 1000 MG tablet Take 1,000 mg by mouth daily.   ASPIRIN LOW DOSE 81 MG EC tablet TAKE 1 TABLET BY MOUTH DAILY (Patient  taking differently: Take 81 mg by mouth daily.)   clopidogrel (PLAVIX) 75 MG tablet Take 1 tablet (75 mg total) by mouth daily.   famotidine (PEPCID) 20 MG tablet TAKE 1 TABLET BY MOUTH 2 TIMES DAILY AS NEEDED FOR HEARTBURN OR INDIGESTION (Patient taking differently: Take 20 mg by mouth 2 (two) times daily.)   glipiZIDE (GLUCOTROL) 10 MG tablet TAKE 2 TABLETS BY MOUTH EVERY DAY BEFOREBREAKFAST (Patient taking differently: Take 20 mg by mouth daily before breakfast. TAKE 2 TABLETS BY MOUTH EVERY DAY BEFOREBREAKFAST)   isosorbide mononitrate (IMDUR) 30 MG 24 hr tablet TAKE 1 TABLET BY MOUTH DAILY (Patient taking differently: Take 30 mg by mouth daily.)   lisinopril (ZESTRIL) 40 MG tablet TAKE 1 TABLET BY MOUTH DAILY. PLEASE TAKE THIS IN THE MORNING. (Patient taking differently: Take 40 mg by mouth daily.)   metFORMIN (GLUCOPHAGE) 1000 MG tablet TAKE 1 AND 1/2 TABLETS BY MOUTH DAILY WITH BREAKFAST (Patient taking differently: Take 1,500 mg by mouth daily with breakfast.)   metoprolol succinate (TOPROL-XL) 200 MG 24 hr tablet Take 1 tablet (200 mg total) by mouth daily. Take with or immediately following a meal.   [DISCONTINUED] atorvastatin (LIPITOR) 40 MG tablet TAKE 2 TABLETS (80 MG TOTAL) BY MOUTH DAILY     Allergies:   Patient has no known allergies.   Social History   Socioeconomic History   Marital status: Single    Spouse name: Not on file   Number of children: 3   Years  of education: Not on file   Highest education level: High school graduate  Occupational History   Occupation: unemployed  Tobacco Use   Smoking status: Some Days    Types: Cigarettes   Smokeless tobacco: Never   Tobacco comments:    quit following having teeth removed - pt reports she smokes when she drinks (edited: 08/09/2020=2)  Vaping Use   Vaping Use: Never used  Substance and Sexual Activity   Alcohol use: Yes    Alcohol/week: 1.0 standard drink of alcohol    Types: 1 Glasses of wine per week    Comment:   wine on the weekends   Drug use: No   Sexual activity: Yes  Other Topics Concern   Not on file  Social History Narrative   A friend supports. Not on food stamps. Pretty well taken care of by friend.    Social Determinants of Health   Financial Resource Strain: Low Risk  (03/31/2018)   Overall Financial Resource Strain (CARDIA)    Difficulty of Paying Living Expenses: Not very hard  Food Insecurity: No Food Insecurity (11/08/2021)   Hunger Vital Sign    Worried About Running Out of Food in the Last Year: Never true    Ran Out of Food in the Last Year: Never true  Transportation Needs: No Transportation Needs (11/08/2021)   PRAPARE - Administrator, Civil Service (Medical): No    Lack of Transportation (Non-Medical): No  Physical Activity: Unknown (03/31/2018)   Exercise Vital Sign    Days of Exercise per Week: 0 days    Minutes of Exercise per Session: Not on file  Stress: No Stress Concern Present (03/31/2018)   [employer] of Occupational Health - Occupational Stress Questionnaire    Feeling of Stress : Not at all  Social Connections: Socially Integrated (03/31/2018)   Social Connection and Isolation Panel [NHANES]    Frequency of Communication with Friends and Family: More than three times a week    Frequency of Social Gatherings with Friends and Family: Once a week    Attends Religious Services: More than 4 times per year    Active Member of [employer] or Organizations: Yes    Attends Engineer, structural: More than 4 times per year    Marital Status: Living with partner     Family History: The patient's family history includes Breast cancer in her cousin; Cancer in her maternal aunt; Diabetes type II in her father and mother; Heart disease in her father.  ROS:   Please see the history of present illness.     All other systems reviewed and are negative.  EKGs/Labs/Other Studies Reviewed:    The following studies were reviewed today:    Echo 03/23/22:   1. Severe septal hypertrophy measuring 2.2 cm. Otherwise moderate  concentric LVH. Findings consistent with hypertrophic obstructive  cardiomyopathy. Apical gradient with peak velocity 3.52 m/s at rest. Peak  gradient 49.4 mmHg. Left ventricular ejection  fraction, by estimation, is 70 to 75%. The left ventricle has hyperdynamic  function. The left ventricle has no regional wall motion abnormalities.  There is severe left ventricular hypertrophy. Left ventricular diastolic  parameters are consistent with  Grade I diastolic dysfunction (impaired relaxation). Elevated left  ventricular end-diastolic pressure.   2. Right ventricular systolic function is normal. The right ventricular  size is normal.   3. The mitral valve is normal in structure. No evidence of mitral valve  regurgitation. No evidence of mitral stenosis.  4. The aortic valve is normal in structure. Aortic valve regurgitation is  not visualized. No aortic stenosis is present.   EKG:  EKG is  ordered today.  The ekg ordered today demonstrates NSR 60bpm, nonspecific T wave changes  Recent Labs: 03/22/2022: BUN 16; Creatinine, Ser 0.63; Hemoglobin 13.5; Platelets 261; Sodium 141 03/23/2022: ALT 19 03/25/2022: Potassium 3.7  Recent Lipid Panel    Component Value Date/Time   CHOL 129 03/23/2022 0535   CHOL 238 (H) 11/01/2021 1820   TRIG 51 03/23/2022 0535   HDL 50 03/23/2022 0535   HDL 60 11/01/2021 1820   CHOLHDL 2.6 03/23/2022 0535   VLDL 10 03/23/2022 0535   LDLCALC 69 03/23/2022 0535   LDLCALC 150 (H) 11/01/2021 1820    Physical Exam:    VS:  BP (!) 110/56 (BP Location: Left Arm, Patient Position: Sitting, Cuff Size: Normal)   Pulse 60   Ht 5\' 3"  (1.6 m)   Wt 157 lb (71.2 kg)   BMI 27.81 kg/m     Wt Readings from Last 3 Encounters:  04/12/22 157 lb (71.2 kg)  03/23/22 160 lb 7.9 oz (72.8 kg)  02/14/22 158 lb 14.4 oz (72.1 kg)     GEN:  Well nourished, well developed in no acute distress HEENT:  Normal NECK: No JVD; No carotid bruits LYMPHATICS: No lymphadenopathy CARDIAC: RRR, no murmurs, rubs, gallops RESPIRATORY:  Clear to auscultation without rales, wheezing or rhonchi  ABDOMEN: Soft, non-tender, non-distended MUSCULOSKELETAL:  No edema; No deformity  SKIN: Warm and dry NEUROLOGIC:  Alert and oriented x 3 PSYCHIATRIC:  Normal affect   ASSESSMENT:    1. HOCM (hypertrophic obstructive cardiomyopathy) (HCC)   2. Non-sustained ventricular tachycardia (HCC)   3. PVC's (premature ventricular contractions)   4. Elevated troponin   5. Non-ST elevation (NSTEMI) myocardial infarction (HCC)   6. Coronary artery disease involving native coronary artery of native heart with angina pectoris (HCC)   7. Essential hypertension   8. Hyperlipidemia, mixed   9. Tobacco abuse    PLAN:    In order of problems listed above:  HOCM Echo during recent hospitalization showed severe septal hypertrophy measuring 2.2cm, moderate concentric LVH, findings consistent with hypertrophic obstructive cardiomyopathy, apical gradient peak gradient 02/16/22, LVEF 70-75%, G1DD. Patient denies any syncope or pre-syncope. She is euvolemic on exam. She is on Toprol 200mg  daily with a heart rate of 60bpm. I will order stress MRI to assess coronary arteries and infiltrative cardiomyopathy. I will refer to the HOCM clinic in [place].   PVCs/NSVT Noted during hospitalization. I will order a 2 week heart monitor to evaluate PVC burden. Continue BB therapy.   Elevated troponin NSTEMI CAD s/p remote PCI Elevated troponin during recent hospitalization felt to be demand ischemia in the setting of severely elevated BP and HOCM with known CAD. She was started on Aspirin and Plavix, will discuss with MD how long we will continue DAPT. Continue Lipitor, Toprol, and Imdur.   HTN BP is good today, 110/56. Continue amlodipine 5mg  daily, Imdur 30mg  daily, lisinopril 40mg  daily, Toprol-XL 200mg  daily.   HLD  LDL 69.  Continue Lipitor 40mg  daily.   Tobacco use The patient smokes when she drinks, this is about once weekly.   Disposition: Follow up in 3 month(s) with MD    Signed, Klyn Kroening , PA-C  04/12/2022 2:14 PM    [facility]

## 2022-04-12 NOTE — Patient Instructions (Signed)
Medication Instructions:  Your physician recommends that you continue on your current medications as directed. Please refer to the Current Medication list given to you today.  Your cardiac medications have been refilled today.  *If you need a refill on your cardiac medications before your next appointment, please call your pharmacy*   Lab Work: Cbc today  Please stop at the [facility] medical mall to have your lab drawn. Stop at the Registration desk to check in.  If you have labs (blood work) drawn today and your tests are completely normal, you will receive your results only by: MyChart Message (if you have MyChart) OR A paper copy in the mail If you have any lab test that is abnormal or we need to change your treatment, we will call you to review the results.   Testing/Procedures: Your physician has requested that you have a cardiac stress MRI. Cardiac MRI uses a computer to create images of your heart as its beating, producing both still and moving pictures of your heart and major blood vessels. For further information please visit [email or web address]. Please follow the instruction sheet given to you today for more information.    Your physician has recommended you wear a 14 day cardiac monitor. This will be mailed to your home.  Follow-Up: At [facility], you and your health needs are our priority.  As part of our continuing mission to provide you with exceptional heart care, we have created designated Provider Care Teams.  These Care Teams include your primary Cardiologist (physician) and Advanced Practice Providers (APPs -  Physician Assistants and Nurse Practitioners) who all work together to provide you with the care you need, when you need it.  We recommend signing up for the patient portal called "MyChart".  Sign up information is provided on this After Visit Summary.  MyChart is used to connect with patients for Virtual Visits (Telemedicine).  Patients are able to view lab/test  results, encounter notes, upcoming appointments, etc.  Non-urgent messages can be sent to your provider as well.   To learn more about what you can do with MyChart, go to [email or web address].    Your next appointment:   3 month(s) to establish with a [employer] have been referred to the HOCM Clinic with Dr. Izora Ribas  The format for your next appointment:   In Person  Provider:   Yvonne Kendall, MD{ .     Other Instructions You are scheduled for Cardiac MRI on TBD Please arrive at the [facility] main entrance of [facility] at TBD (30-45 minutes prior to test start time). ?  [facility]  [address]  [place], [place] [address]  [phone]  Proceed to the [facility] Radiology Department (First Floor).  ?  Magnetic resonance imaging (MRI) is a painless test that produces images of the inside of the body without using X-rays. During an MRI, strong magnets and radio waves work together in a Data processing manager to form detailed images. MRI images may provide more details about a medical condition than X-rays, CT scans, and ultrasounds can provide.  You may be given earphones to listen for instructions.  You may eat a light breakfast and take medications as ordered with the exception of metformin and Glipizide  If a contrast material will be used, an IV will be inserted into one of your veins. Contrast material will be injected into your IV.  You will be asked to remove all metal, including: Watch, jewelry, and  other metal objects including hearing aids, hair pieces and dentures. (Braces and fillings normally are not a problem.)  If contrast material was used:  It will leave your body through your urine within a day. You may be told to drink plenty of fluids to help flush the contrast material out of your system.  TEST WILL TAKE APPROXIMATELY 1 HOUR  PLEASE NOTIFY SCHEDULING AT LEAST 24 HOURS IN ADVANCE IF YOU ARE UNABLE TO KEEP YOUR  APPOINTMENT.      Your provider has ordered a heart monitor to wear for 14 days. This will be mailed to your home with instructions on placement. Once you have finished the time frame requested, you will return monitor in box provided.     Important Information About Sugar

## 2022-04-18 ENCOUNTER — Other Ambulatory Visit (HOSPITAL_COMMUNITY): Payer: Self-pay | Admitting: Emergency Medicine

## 2022-04-18 DIAGNOSIS — I421 Obstructive hypertrophic cardiomyopathy: Secondary | ICD-10-CM

## 2022-04-26 ENCOUNTER — Encounter (HOSPITAL_COMMUNITY): Payer: Self-pay | Admitting: Emergency Medicine

## 2022-04-26 ENCOUNTER — Telehealth (HOSPITAL_COMMUNITY): Payer: Self-pay | Admitting: Emergency Medicine

## 2022-04-26 NOTE — Telephone Encounter (Signed)
Reaching out to patient to offer assistance regarding upcoming cardiac imaging study; pt verbalizes understanding of appt date/time, parking situation and where to check in, pre-test NPO status and medications ordered, and verified current allergies; name and call back number provided for further questions should they arise Laurie Alexandria RN Navigator Cardiac Imaging [facility] [phone] office [phone] cell   Denies iv issues Denies claustro Denies metal implants Instructed not to eat 3 hr prior to scan Instructed not to have caffeine for 12 hr prior to scan Letter mailed 6/30

## 2022-04-26 NOTE — Telephone Encounter (Deleted)
Attempted to call patient regarding upcoming cardiac MR appointment. Left message on voicemail with name and callback number Adysson Revelle RN Navigator Cardiac Imaging [facility] [phone] Office [phone] Cell  

## 2022-05-01 ENCOUNTER — Ambulatory Visit (HOSPITAL_COMMUNITY)
Admission: RE | Admit: 2022-05-01 | Discharge: 2022-05-01 | Disposition: A | Discharge status: Home / Self Care | Payer: Self-pay | Source: Ambulatory Visit | Attending: Medical | Admitting: Medical

## 2022-05-01 ENCOUNTER — Encounter: Payer: Self-pay | Admitting: Cardiology

## 2022-05-01 DIAGNOSIS — I421 Obstructive hypertrophic cardiomyopathy: Secondary | ICD-10-CM | POA: Insufficient documentation

## 2022-05-01 MED ORDER — REGADENOSON 0.4 MG/5ML IV SOLN
0.4000 mg | Freq: Once | INTRAVENOUS | Status: AC
  Filled 2022-05-01: qty 5

## 2022-05-01 MED ORDER — AMINOPHYLLINE 25 MG/ML IV SOLN
INTRAVENOUS | Status: AC
  Filled 2022-05-01: qty 10

## 2022-05-01 MED ORDER — GADOBUTROL 1 MMOL/ML IV SOLN
10.0000 mL | Freq: Once | INTRAVENOUS | Status: AC | PRN
  Administered 2022-05-01: 10 mL via INTRAVENOUS

## 2022-05-01 MED ORDER — ALBUTEROL SULFATE HFA 108 (90 BASE) MCG/ACT IN AERS
INHALATION_SPRAY | RESPIRATORY_TRACT | Status: AC
  Filled 2022-05-01: qty 6.7

## 2022-05-01 MED ORDER — REGADENOSON 0.4 MG/5ML IV SOLN
INTRAVENOUS | Status: AC
  Administered 2022-05-01: 0.4 mg via INTRAVENOUS
  Filled 2022-05-01: qty 5

## 2022-05-01 MED ORDER — NITROGLYCERIN 0.4 MG SL SUBL
SUBLINGUAL_TABLET | SUBLINGUAL | Status: AC
  Filled 2022-05-01: qty 1

## 2022-05-01 MED ORDER — METOPROLOL TARTRATE 5 MG/5ML IV SOLN
INTRAVENOUS | Status: AC
  Filled 2022-05-01: qty 5

## 2022-05-01 NOTE — Progress Notes (Signed)
1 minute post Lexiscan administration. Pt denies symptoms. Dr. Bjorn Pippin present at scanner.

## 2022-05-01 NOTE — Progress Notes (Signed)
Patient presents for stress MRI.  BP 128/74, HR 71.  No caffeine intake in prior 12 hours. No wheezing on exam.  EKG today shows NSR.   Shared Decision Making/Informed Consent The risks [chest pain, shortness of breath, cardiac arrhythmias, dizziness, blood pressure fluctuations, myocardial infarction, stroke/transient ischemic attack, nausea, vomiting, allergic reaction, and life-threatening complications (estimated to be 1 in 10,000)], benefits (risk stratification, diagnosing coronary artery disease, treatment guidance) and alternatives of a MRI stress test were discussed in detail with patient and they agree to proceed.

## 2022-05-16 ENCOUNTER — Ambulatory Visit: Payer: Self-pay | Admitting: Gerontology

## 2022-05-16 VITALS — BP 114/68 | HR 70 | Resp 16 | Wt 157.6 lb

## 2022-05-16 DIAGNOSIS — Z09 Encounter for follow-up examination after completed treatment for conditions other than malignant neoplasm: Secondary | ICD-10-CM

## 2022-05-16 DIAGNOSIS — I10 Essential (primary) hypertension: Secondary | ICD-10-CM

## 2022-05-16 DIAGNOSIS — E119 Type 2 diabetes mellitus without complications: Secondary | ICD-10-CM

## 2022-05-16 DIAGNOSIS — E785 Hyperlipidemia, unspecified: Secondary | ICD-10-CM

## 2022-05-16 DIAGNOSIS — K219 Gastro-esophageal reflux disease without esophagitis: Secondary | ICD-10-CM

## 2022-05-16 DIAGNOSIS — I25119 Atherosclerotic heart disease of native coronary artery with unspecified angina pectoris: Secondary | ICD-10-CM

## 2022-05-16 LAB — POCT GLYCOSYLATED HEMOGLOBIN (HGB A1C): Hemoglobin A1C: 7.8 % — AB (ref 4.0–5.6)

## 2022-05-16 LAB — GLUCOSE, POCT (MANUAL RESULT ENTRY): POC Glucose: 100 mg/dl — AB (ref 70–99)

## 2022-05-16 MED ORDER — GLIPIZIDE 10 MG PO TABS
ORAL_TABLET | ORAL | 1 refills | Status: DC
  Filled 2022-05-16: qty 180, 90d supply, fill #0
  Filled 2022-09-30: qty 180, 90d supply, fill #1

## 2022-05-16 MED ORDER — METOPROLOL SUCCINATE ER 100 MG PO TB24
200.0000 mg | ORAL_TABLET | Freq: Every day | ORAL | 1 refills | Status: DC
  Filled 2022-05-16: qty 180, 90d supply, fill #0
  Filled 2022-09-30: qty 180, 90d supply, fill #1

## 2022-05-16 MED ORDER — FAMOTIDINE 20 MG PO TABS
20.0000 mg | ORAL_TABLET | Freq: Two times a day (BID) | ORAL | 1 refills | Status: DC | PRN
  Filled 2022-05-16: qty 180, 90d supply, fill #0
  Filled 2022-09-30: qty 180, 90d supply, fill #1

## 2022-05-16 MED ORDER — LISINOPRIL 40 MG PO TABS: 40.0000 mg | ORAL_TABLET | Freq: Every day | ORAL | 1 refills | Status: DC

## 2022-05-16 MED ORDER — ISOSORBIDE MONONITRATE ER 30 MG PO TB24
30.0000 mg | ORAL_TABLET | Freq: Every day | ORAL | 1 refills | Status: DC
  Filled 2022-05-16: qty 90, 90d supply, fill #0
  Filled 2022-09-30: qty 90, 90d supply, fill #1

## 2022-05-16 MED ORDER — FAMOTIDINE 20 MG PO TABS: 20.0000 mg | ORAL_TABLET | Freq: Two times a day (BID) | ORAL | 1 refills | Status: DC | PRN

## 2022-05-16 MED ORDER — LISINOPRIL 40 MG PO TABS
40.0000 mg | ORAL_TABLET | Freq: Every day | ORAL | 1 refills | Status: DC
  Filled 2022-05-16: qty 90, 90d supply, fill #0
  Filled 2022-09-30: qty 90, 90d supply, fill #1

## 2022-05-16 MED ORDER — ASPIRIN 81 MG PO TBEC
81.0000 mg | DELAYED_RELEASE_TABLET | Freq: Every day | ORAL | 0 refills | Status: DC
  Filled 2022-05-16: qty 90, 90d supply, fill #0

## 2022-05-16 MED ORDER — ATORVASTATIN CALCIUM 40 MG PO TABS
80.0000 mg | ORAL_TABLET | Freq: Every day | ORAL | 1 refills | Status: DC
  Filled 2022-05-16: qty 90, 45d supply, fill #0
  Filled 2022-09-30: qty 90, 45d supply, fill #1

## 2022-05-16 MED ORDER — AMLODIPINE BESYLATE 5 MG PO TABS
5.0000 mg | ORAL_TABLET | Freq: Every day | ORAL | 1 refills | Status: DC
  Filled 2022-05-16: qty 90, 90d supply, fill #0
  Filled 2022-09-30: qty 90, 90d supply, fill #1

## 2022-05-16 MED ORDER — METFORMIN HCL 1000 MG PO TABS
1500.0000 mg | ORAL_TABLET | Freq: Every day | ORAL | 0 refills | Status: DC
  Filled 2022-05-16: qty 135, 90d supply, fill #0
  Filled 2022-09-30: qty 135, 90d supply, fill #1

## 2022-05-16 MED ORDER — CLOPIDOGREL BISULFATE 75 MG PO TABS
75.0000 mg | ORAL_TABLET | Freq: Every day | ORAL | 1 refills | Status: DC
  Filled 2022-05-16: qty 90, 90d supply, fill #0
  Filled 2022-09-30: qty 90, 90d supply, fill #1

## 2022-05-16 NOTE — Progress Notes (Signed)
Established Patient Office Visit  Subjective   Patient ID: Laurie Kirby, female    DOB: Jul 21, 1961  Age: 61 y.o. MRN: [id number]  No chief complaint on file.   HPI  Laurie Kirby is a 61 y/o female who has history of Type 2 diabetes mellitus, Hyperlipidemia, Hypertension presents for hospital discharge follow up and medication refill.Her HgbA1c done during visit decreased from 8.8% to 7.8% ad blood glucose was 100 mg/dl. She states that she is compliant with her medications, denies side effects and continues to make healthy lifestyle changes. She was discharged from the hospital on  03/25/22. During her hospital course, she was treated for chest pain, Echo showed LVEF 70-75% and severe hypertrophy and outflow tract obstruction consistent with HOCM .She followed up with Cardiologist on 04/12/22 with Pauletta Browns PA-C. She had cardiac stress test done on 05/01/22 and it showed 1.  No evidence of ischemia on stress perfusion imaging  2. Severe asymmetric LV hypertrophy measuring 54mm in basal septum (1mm in posterior wall), consistent with hypertrophic cardiomyopathy  3. Patchy late gadolinium enhancement in RV insertion site and basal inferior wall, consistent with HCM. LGE accounts for 5% of total myocardial mass  4.  Normal LV size and systolic function (EF 62%)  5.  Normal RV size and systolic function (EF 68%). She will follow up with Cardiologist on 05/29/22. Currently, she denies chest pain,palpitation, shortness of breath and fatigue. Overall, she states that she is doing well and offers no further complaint.         Review of Systems  Constitutional: Negative.   Eyes: Negative.   Respiratory: Negative.    Cardiovascular: Negative.   Skin: Negative.   Neurological: Negative.   Psychiatric/Behavioral: Negative.        Objective:     BP 114/68 (BP Location: Right Arm, Patient Position: Sitting, Cuff Size: Large)   Pulse 70   Resp 16   Wt 157 lb 9.6 oz (71.5 kg)   BMI 27.92 kg/m   BP Readings from Last 3 Encounters:  05/16/22 114/68  05/01/22 (!) 150/62  04/12/22 (!) 110/56   Wt Readings from Last 3 Encounters:  05/16/22 157 lb 9.6 oz (71.5 kg)  04/12/22 157 lb (71.2 kg)  03/23/22 160 lb 7.9 oz (72.8 kg)      Physical Exam HENT:     Head: Normocephalic and atraumatic.  Eyes:     Extraocular Movements: Extraocular movements intact.     Conjunctiva/sclera: Conjunctivae normal.     Pupils: Pupils are equal, round, and reactive to light.  Cardiovascular:     Rate and Rhythm: Normal rate and regular rhythm.     Pulses: Normal pulses.     Heart sounds: Normal heart sounds.  Pulmonary:     Effort: Pulmonary effort is normal.     Breath sounds: Normal breath sounds.  Skin:    General: Skin is warm.  Neurological:     General: No focal deficit present.     Mental Status: She is alert and oriented to person, place, and time.  Psychiatric:        Mood and Affect: Mood normal.        Behavior: Behavior normal.        Thought Content: Thought content normal.        Judgment: Judgment normal.      Results for orders placed or performed in visit on 05/16/22  POCT Glucose (CBG)  Result Value Ref Range   POC  Glucose 100 (A) 70 - 99 mg/dl  POCT HgB P5F  Result Value Ref Range   Hemoglobin A1C 7.8 (A) 4.0 - 5.6 %   HbA1c POC (<> result, manual entry)     HbA1c, POC (prediabetic range)     HbA1c, POC (controlled diabetic range)      Last CBC Lab Results  Component Value Date   WBC 17.5 (H) 03/22/2022   HGB 12.0 04/12/2022   HCT 36.5 04/12/2022   MCV 90.4 03/22/2022   MCH 29.5 03/22/2022   RDW 12.6 03/22/2022   PLT 261 03/22/2022   Last metabolic panel Lab Results  Component Value Date   GLUCOSE 179 (H) 03/22/2022   NA 141 03/22/2022   K 3.7 03/25/2022   CL 106 03/22/2022   CO2 25 03/22/2022   BUN 16 03/22/2022   CREATININE 0.63 03/22/2022   GFRNONAA >60 03/22/2022   CALCIUM 9.4 03/22/2022   PROT 7.4 03/23/2022   ALBUMIN 3.6 03/23/2022    LABGLOB 3.2 08/02/2021   AGRATIO 1.4 08/02/2021   BILITOT 0.6 03/23/2022   ALKPHOS 53 03/23/2022   AST 19 03/23/2022   ALT 19 03/23/2022   ANIONGAP 10 03/22/2022   Last lipids Lab Results  Component Value Date   CHOL 129 03/23/2022   HDL 50 03/23/2022   LDLCALC 69 03/23/2022   TRIG 51 03/23/2022   CHOLHDL 2.6 03/23/2022   Last hemoglobin A1c Lab Results  Component Value Date   HGBA1C 7.8 (A) 05/16/2022   Last thyroid functions Lab Results  Component Value Date   TSH 1.720 01/11/2021      The ASCVD Risk score (Arnett DK, et al., 2019) failed to calculate for the following reasons:   The patient has a prior MI or stroke diagnosis    Assessment & Plan:   1. Type 2 diabetes mellitus without complication, without long-term current use of insulin (HCC) - Her HgbA1c was 7.8%, will continue on current medication, low carb/non concentrated sweet diet ad exercise as tolerated. - POCT HgB A1C; Future - POCT Glucose (CBG); Future - POCT Glucose (CBG) - POCT HgB A1C - metFORMIN (GLUCOPHAGE) 1000 MG tablet; Take 1.5 tablets (1,500 mg total) by mouth daily with breakfast.  Dispense: 270 tablet; Refill: 0 - glipiZIDE (GLUCOTROL) 10 MG tablet; TAKE 2 TABLETS BY MOUTH EVERY DAY BEFOREBREAKFAST  Dispense: 180 tablet; Refill: 1 - metFORMIN (GLUCOPHAGE) 1000 MG tablet; Take 1.5 tablets (1,500 mg total) by mouth daily with breakfast.  Dispense: 270 tablet; Refill: 0  2. Hospital discharge follow-up - Will recheck labs and advised to continue hospital discharge instructions - CBC w/Diff; Future - Basic Metabolic Panel (BMET); Future - Basic Metabolic Panel (BMET) - CBC w/Diff  3. Atherosclerosis of native coronary artery with angina pectoris, unspecified whether native or transplanted heart (HCC) - She will continue current medication. - aspirin EC (ASPIRIN LOW DOSE) 81 MG tablet; Take 1 tablet (81 mg total) by mouth daily. Swallow whole.  Dispense: 90 tablet; Refill: 0 -  clopidogrel (PLAVIX) 75 MG tablet; Take 1 tablet (75 mg total) by mouth daily.  Dispense: 90 tablet; Refill: 1 - isosorbide mononitrate (IMDUR) 30 MG 24 hr tablet; Take 1 tablet (30 mg total) by mouth daily.  Dispense: 90 tablet; Refill: 1  4. Hyperlipidemia, unspecified hyperlipidemia type -She will continue current medication, low fat cholesterol diet. - atorvastatin (LIPITOR) 40 MG tablet; Take 2 tablets (80 mg total) by mouth daily.  Dispense: 90 tablet; Refill: 1  5. Gastroesophageal reflux disease without esophagitis -  She will continue current medication - famotidine (PEPCID) 20 MG tablet; Take 1 tablet (20 mg total) by mouth 2 (two) times daily as needed for heartburn or indigestion. TAKE 1 TABLET BY MOUTH 2 TIMES DAILY AS NEEDED FOR HEARTBURN OR INDIGESTION Strength: 20 mg  Dispense: 180 tablet; Refill: 1   6. Essential hypertension - Her blood pressure is under control and continue current medication, DASH diet. - amLODipine (NORVASC) 5 MG tablet; Take 1 tablet (5 mg total) by mouth daily.  Dispense: 90 tablet; Refill: 1 - metoprolol (TOPROL-XL) 200 MG 24 hr tablet; Take 1 tablet (200 mg total) by mouth daily. Take with or immediately following a meal.  Dispense: 90 tablet; Refill: 1 - lisinopril (ZESTRIL) 40 MG tablet; Take 1 tablet (40 mg total) by mouth daily. TAKE 1 TABLET BY MOUTH DAILY. PLEASE TAKE THIS IN THE MORNING. Strength: 40 mg  Dispense: 90 tablet; Refill: 1   Return in about 13 weeks (around 08/15/2022), or if symptoms worsen or fail to improve.    Eivin Mascio Trellis Paganini, NP

## 2022-05-16 NOTE — Patient Instructions (Signed)

## 2022-05-17 ENCOUNTER — Other Ambulatory Visit: Payer: Self-pay

## 2022-05-17 LAB — BASIC METABOLIC PANEL
BUN/Creatinine Ratio: 26 — ABNORMAL HIGH (ref 9–23)
BUN: 21 mg/dL (ref 6–24)
CO2: 23 mmol/L (ref 20–29)
Calcium: 9.7 mg/dL (ref 8.7–10.2)
Chloride: 108 mmol/L — ABNORMAL HIGH (ref 96–106)
Creatinine, Ser: 0.82 mg/dL (ref 0.57–1.00)
Glucose: 104 mg/dL — ABNORMAL HIGH (ref 70–99)
Potassium: 4.2 mmol/L (ref 3.5–5.2)
Sodium: 143 mmol/L (ref 134–144)
eGFR: 82 mL/min/{1.73_m2} (ref >59)

## 2022-05-17 LAB — CBC WITH DIFFERENTIAL/PLATELET
Basophils Absolute: 0.1 10*3/uL (ref 0.0–0.2)
Basos: 0 %
EOS (ABSOLUTE): 0.3 10*3/uL (ref 0.0–0.4)
Eos: 2 %
Hematocrit: 34 % (ref 34.0–46.6)
Hemoglobin: 11.4 g/dL (ref 11.1–15.9)
Immature Grans (Abs): 0 10*3/uL (ref 0.0–0.1)
Immature Granulocytes: 0 %
Lymphocytes Absolute: 5.5 10*3/uL — ABNORMAL HIGH (ref 0.7–3.1)
Lymphs: 38 %
MCH: 29.6 pg (ref 26.6–33.0)
MCHC: 33.5 g/dL (ref 31.5–35.7)
MCV: 88 fL (ref 79–97)
Monocytes Absolute: 0.7 10*3/uL (ref 0.1–0.9)
Monocytes: 5 %
Neutrophils Absolute: 7.8 10*3/uL — ABNORMAL HIGH (ref 1.4–7.0)
Neutrophils: 55 %
Platelets: 287 10*3/uL (ref 150–450)
RBC: 3.85 x10E6/uL (ref 3.77–5.28)
RDW: 13 % (ref 11.7–15.4)
WBC: 14.4 10*3/uL — ABNORMAL HIGH (ref 3.4–10.8)

## 2022-05-18 ENCOUNTER — Other Ambulatory Visit: Payer: Self-pay

## 2022-05-20 ENCOUNTER — Other Ambulatory Visit: Payer: Self-pay

## 2022-05-22 ENCOUNTER — Other Ambulatory Visit: Payer: Self-pay

## 2022-05-23 ENCOUNTER — Other Ambulatory Visit: Payer: Self-pay | Admitting: Pharmacy Technician

## 2022-05-23 ENCOUNTER — Other Ambulatory Visit: Payer: Self-pay

## 2022-05-23 NOTE — Patient Outreach (Signed)
Patient only signed DOH Attestation.  Would need to provide current year's household income if PAP medications were needed.  Ayansh Feutz J. [place] Lapaglia Patient Advocate Specialist [facility]  

## 2022-05-23 NOTE — Patient Outreach (Signed)
Patient currently uninsured.  Received 2023 poi.  Eligible for medication assistance at [facility] as long as eligibility continues to be met.  Saman Giddens J. Gaius Ishaq Patient Advocate Specialist [facility]  

## 2022-05-28 NOTE — Progress Notes (Deleted)
Cardiology Office Note:    Date:  05/28/2022   ID:  Laurie Kirby, DOB 06/08/1961, MRN [id number]  PCP:  Pcp, No   [facility] Providers Cardiologist:  None { Click to update primary MD,subspecialty MD or APP then REFRESH:1}    Referring MD: Fransico Michael, Cadence H, PA-C   CC: *** Consulted for the evaluation of HCM at the behest of Cadence Further PA   History of Present Illness:    Laurie Kirby is a 61 y.o. female with a hx of CAD, HTN with DM, HLD with DM who presents for evaluation for HCM (septal thickness 26 mm).  Patient notes that (s)he is feeling ***.   Was last feeling well ***. Able to ***  Has had no chest pain, chest pressure, chest tightness, chest stinging ***.  Discomfort occurs with ***, worsens with ***, and improves with ***.    Patient exertion notable for *** with *** and feels no symptoms.    No shortness of breath, DOE ***.  No PND or orthopnea***.  No weight gain***, leg swelling ***, or abdominal swelling***.  No syncope or near syncope ***. Notes *** no palpitations or funny heart beats.     Patient reports prior cardiac testing including ***  No history of ***pre-eclampsia, gestation HTN or gestational DM.  No Fen-Phen or drug use***.  Ambulatory BP ***.   Past Medical History:  Diagnosis Date   CAD (coronary artery disease)    Diabetes mellitus without complication (HCC)    Hyperlipidemia    Hypertension     Past Surgical History:  Procedure Laterality Date   CORONARY ANGIOPLASTY WITH STENT PLACEMENT      Current Medications: No outpatient medications have been marked as taking for the 05/29/22 encounter (Appointment) with Christell Constant, MD.     Allergies:   Patient has no known allergies.   Social History   Socioeconomic History   Marital status: Single    Spouse name: Not on file   Number of children: 3   Years of education: Not on file   Highest education level: High school graduate  Occupational History    Occupation: unemployed  Tobacco Use   Smoking status: Some Days    Types: Cigarettes   Smokeless tobacco: Never   Tobacco comments:    quit following having teeth removed - pt reports she smokes when she drinks (edited: 08/09/2020=2)  Vaping Use   Vaping Use: Never used  Substance and Sexual Activity   Alcohol use: Yes    Alcohol/week: 1.0 standard drink of alcohol    Types: 1 Glasses of wine per week    Comment:  wine on the weekends   Drug use: No   Sexual activity: Yes  Other Topics Concern   Not on file  Social History Narrative   A friend supports. Not on food stamps. Pretty well taken care of by friend.    Social Determinants of Health   Financial Resource Strain: Low Risk  (03/31/2018)   Overall Financial Resource Strain (CARDIA)    Difficulty of Paying Living Expenses: Not very hard  Food Insecurity: No Food Insecurity (11/08/2021)   Hunger Vital Sign    Worried About Running Out of Food in the Last Year: Never true    Ran Out of Food in the Last Year: Never true  Transportation Needs: No Transportation Needs (11/08/2021)   PRAPARE - Administrator, Civil Service (Medical): No    Lack of Transportation (Non-Medical):  No  Physical Activity: Unknown (03/31/2018)   Exercise Vital Sign    Days of Exercise per Week: 0 days    Minutes of Exercise per Session: Not on file  Stress: No Stress Concern Present (03/31/2018)   [employer] of Occupational Health - Occupational Stress Questionnaire    Feeling of Stress : Not at all  Social Connections: Socially Integrated (03/31/2018)   Social Connection and Isolation Panel [NHANES]    Frequency of Communication with Friends and Family: More than three times a week    Frequency of Social Gatherings with Friends and Family: Once a week    Attends Religious Services: More than 4 times per year    Active Member of [employer] or Organizations: Yes    Attends Engineer, structural: More than 4 times per year    Marital  Status: Living with partner     Family History: The patient's ***family history includes Breast cancer in her cousin; Cancer in her maternal aunt; Diabetes type II in her father and mother; Heart disease in her father.  ROS:   Please see the history of present illness.    *** All other systems reviewed and are negative.  EKGs/Labs/Other Studies Reviewed:    The following studies were reviewed today: ***  EKG:  EKG is *** ordered today.  The ekg ordered today demonstrates ***  Recent Labs: 03/23/2022: ALT 19 05/16/2022: BUN 21; Creatinine, Ser 0.82; Hemoglobin 11.4; Platelets 287; Potassium 4.2; Sodium 143  Recent Lipid Panel    Component Value Date/Time   CHOL 129 03/23/2022 0535   CHOL 238 (H) 11/01/2021 1820   TRIG 51 03/23/2022 0535   HDL 50 03/23/2022 0535   HDL 60 11/01/2021 1820   CHOLHDL 2.6 03/23/2022 0535   VLDL 10 03/23/2022 0535   LDLCALC 69 03/23/2022 0535   LDLCALC 150 (H) 11/01/2021 1820     Risk Assessment/Calculations:   {Does this patient have ATRIAL FIBRILLATION?[phone]}       Physical Exam:    VS:  There were no vitals taken for this visit.    Wt Readings from Last 3 Encounters:  05/16/22 157 lb 9.6 oz (71.5 kg)  04/12/22 157 lb (71.2 kg)  03/23/22 160 lb 7.9 oz (72.8 kg)     GEN: *** Well nourished, well developed in no acute distress HEENT: Normal NECK: No JVD; No carotid bruits LYMPHATICS: No lymphadenopathy CARDIAC: ***RRR, no murmurs, rubs, gallops RESPIRATORY:  Clear to auscultation without rales, wheezing or rhonchi  ABDOMEN: Soft, non-tender, non-distended MUSCULOSKELETAL:  No edema; No deformity  SKIN: Warm and dry NEUROLOGIC:  Alert and oriented x 3 PSYCHIATRIC:  Normal affect   ASSESSMENT:    No diagnosis found. PLAN:    Hypertrophic Cardiomyopathy - *** Variant - peak gradient *** on ***  - ***with/without MR, Apical Aneurysm, other considerations***  - NYHA ***  - Family history ***, Discussed family screening   (***)  - Exercise testing normal for *** rhythm and *** peak gradient - Echo from *** notable for *** - CMR from *** notable for ***  - *** presence/absence of atrial fibrillation - *** 2 year assessment for VT on rhythm monitor ***  - *** CCB, BB,disopyramide; mavacamten consideration - *** Lasix if necessary  - ICD/Discussions:  EF of ***, QRS duration of ***, *** syncope   [email or web address]   {Are you ordering a CV Procedure (e.g. stress test, cath, DCCV, TEE, etc)?   Press F2        :[phone]}  Medication Adjustments/Labs and Tests Ordered: Current medicines are reviewed at length with the patient today.  Concerns regarding medicines are outlined above.  No orders of the defined types were placed in this encounter.  No orders of the defined types were placed in this encounter.   There are no Patient Instructions on file for this visit.   Signed, Christell Constant, MD  05/28/2022 3:52 PM    [facility]

## 2022-05-29 ENCOUNTER — Institutional Professional Consult (permissible substitution): Payer: Self-pay | Admitting: Internal Medicine

## 2022-07-24 ENCOUNTER — Ambulatory Visit: Payer: Self-pay | Admitting: Internal Medicine

## 2022-07-24 ENCOUNTER — Encounter: Payer: Self-pay | Admitting: Internal Medicine

## 2022-07-24 NOTE — Progress Notes (Deleted)
   Follow-up Outpatient Visit Date: 07/24/2022  Primary Care Provider: Pcp, No No address on file  Chief Complaint: ***  HPI:  Ms. Burtch is a 61 y.o. female with history of hypertrophic cardiomyopathy, coronary artery disease status post PCI to the RCA (2012), hypertension, hyperlipidemia, type 2 diabetes mellitus, and tobacco abuse, who presents for follow-up of hypertrophic cardiomyopathy and coronary artery disease.  She was previously established in our office with Dr. Rockey Situ and was hospitalized in May with chest pain in the setting of uncontrolled hypertension.  Cardiac MRI stress test in July showed no evidence of ischemia.  There was severe asymmetric LV hypertrophy involving the basal septum consistent with hypertrophic cardiomyopathy.  There was also patchy late gadolinium enhancement at the RV insertion and basal inferior wall.  LVEF normal (62%).  Monitor was ordered at her visit in June, though it does not appear to have been completed.  --------------------------------------------------------------------------------------------------  Past Medical History:  Diagnosis Date   CAD (coronary artery disease)    Diabetes mellitus without complication ([place])    Hyperlipidemia    Hypertension    Past Surgical History:  Procedure Laterality Date   CORONARY ANGIOPLASTY WITH STENT PLACEMENT      No outpatient medications have been marked as taking for the 07/24/22 encounter (Appointment) with Nikkolas Coomes, Harrell Gave, MD.    Allergies: Patient has no known allergies.  Social History   Tobacco Use   Smoking status: Some Days    Types: Cigarettes   Smokeless tobacco: Never   Tobacco comments:    quit following having teeth removed - pt reports she smokes when she drinks (edited: 08/09/2020=2)  Vaping Use   Vaping Use: Never used  Substance Use Topics   Alcohol use: Yes    Alcohol/week: 1.0 standard drink of alcohol    Types: 1 Glasses of wine per week    Comment:  wine on the  weekends   Drug use: No    Family History  Problem Relation Age of Onset   Diabetes type II Mother    Diabetes type II Father    Heart disease Father    Cancer Maternal Aunt    Breast cancer Cousin     Review of Systems: A 12-system review of systems was performed and was negative except as noted in the HPI.  --------------------------------------------------------------------------------------------------  Physical Exam: There were no vitals taken for this visit.  General:  NAD. Neck: No JVD or HJR. Lungs: Clear to auscultation bilaterally without wheezes or crackles. Heart: Regular rate and rhythm without murmurs, rubs, or gallops. Abdomen: Soft, nontender, nondistended. Extremities: No lower extremity edema.  EKG:  ***  Lab Results  Component Value Date   WBC 14.4 (H) 05/16/2022   HGB 11.4 05/16/2022   HCT 34.0 05/16/2022   MCV 88 05/16/2022   PLT 287 05/16/2022    Lab Results  Component Value Date   NA 143 05/16/2022   K 4.2 05/16/2022   CL 108 (H) 05/16/2022   CO2 23 05/16/2022   BUN 21 05/16/2022   CREATININE 0.82 05/16/2022   GLUCOSE 104 (H) 05/16/2022   ALT 19 03/23/2022    Lab Results  Component Value Date   CHOL 129 03/23/2022   HDL 50 03/23/2022   LDLCALC 69 03/23/2022   TRIG 51 03/23/2022   CHOLHDL 2.6 03/23/2022    --------------------------------------------------------------------------------------------------  ASSESSMENT AND PLAN: Harrell Gave Armon Orvis, MD 07/24/2022 8:39 AM

## 2022-08-15 ENCOUNTER — Ambulatory Visit: Payer: Self-pay

## 2022-08-19 ENCOUNTER — Encounter: Payer: Self-pay | Admitting: Medical

## 2022-08-19 ENCOUNTER — Ambulatory Visit: Payer: Self-pay | Attending: Internal Medicine | Admitting: Medical

## 2022-08-19 VITALS — BP 136/80 | HR 72 | Ht 63.0 in | Wt 162.0 lb

## 2022-08-19 DIAGNOSIS — E782 Mixed hyperlipidemia: Secondary | ICD-10-CM

## 2022-08-19 DIAGNOSIS — I421 Obstructive hypertrophic cardiomyopathy: Secondary | ICD-10-CM

## 2022-08-19 DIAGNOSIS — I214 Non-ST elevation (NSTEMI) myocardial infarction: Secondary | ICD-10-CM

## 2022-08-19 DIAGNOSIS — I493 Ventricular premature depolarization: Secondary | ICD-10-CM

## 2022-08-19 DIAGNOSIS — I2510 Atherosclerotic heart disease of native coronary artery without angina pectoris: Secondary | ICD-10-CM

## 2022-08-19 DIAGNOSIS — I10 Essential (primary) hypertension: Secondary | ICD-10-CM

## 2022-08-19 NOTE — Progress Notes (Signed)
Cardiology Office Note:    Date:  08/19/2022   ID:  Laurie Kirby, DOB 10-26-1961, MRN [id number]  PCP:  Pcp, No  [facility] Cardiologist:  Julien Nordmann, MD  [facility] Electrophysiologist:  None   Referring MD: No ref. provider found   Chief Complaint: 60-month follow-up  History of Present Illness:    Laurie Kirby is a 61 y.o. female with a hx of  CAD s/p PCI RCA 05/2011, diabetes, HTN, HLD, tobacco use, noncompliance, and recently diagnosed HOCM being seen for hospital follow-up.    She was admitted 03/23/22 for chest pain. BP was severely elevated with systolics in the 200s. HS troponin ws minimally elevated to 92. Echo showed LVEF 70-75%,  severe hypertrophy and outflow tract obstruction consistent with HOCM. Recommended outpatient stress MRI to evaluated coronary arteries and infiltrative disease. It was also noted she had NSVT and PVCs. Bps improved She was sent home on aspirin, Plavix, amlodipine, metoprolol, lisinopril, and Imdur.  Last seen 03/2022 and was doing well. A stress MRI was ordered for HOCM and a heart monitor was ordered for PVCs/NSVT. She was referred to the HOCM clinic in [place].  Heart monitor was not completed. Cardiac MRI showed no evidence of ischemia, severe LVH measuring 21 mm in basal septum, consistent with hokum, patchy late gadolinium enhancement RV insertion site and basal inferior wall consistent with hokum, LGE accounts for 5% of total myocardial mass, normal LVEF.  Today, the patient reports she has been dong well. She denies chest pain, shortness of breath, lower leg edema, orthopnea, pnd, or palpitations. Patient is agreeable to see the HOCM clinic in [place]. She said they never previously called to set up an appointment. BP is mildly elevated, but she has not had her cardiac medications. She said she completed the heart monitor and sent it in.    Past Medical History:  Diagnosis Date   CAD (coronary artery disease)    Diabetes  mellitus without complication (HCC)    Hyperlipidemia    Hypertension    Hypertrophic cardiomyopathy (HCC)     Past Surgical History:  Procedure Laterality Date   CORONARY ANGIOPLASTY WITH STENT PLACEMENT      Current Medications: Current Meds  Medication Sig   amLODipine (NORVASC) 5 MG tablet Take 1 tablet (5 mg total) by mouth daily.   Ascorbic Acid (VITAMIN C) 1000 MG tablet Take 1,000 mg by mouth daily.   aspirin EC (ASPIRIN LOW DOSE) 81 MG tablet Take 1 tablet (81 mg total) by mouth daily. Swallow whole.   atorvastatin (LIPITOR) 40 MG tablet Take 2 tablets (80 mg total) by mouth daily.   clopidogrel (PLAVIX) 75 MG tablet Take 1 tablet (75 mg total) by mouth daily.   famotidine (PEPCID) 20 MG tablet Take 1 tablet (20 mg total) by mouth 2 (two) times daily as needed for heartburn or indigestion. TAKE 1 TABLET BY MOUTH 2 TIMES DAILY AS NEEDED FOR HEARTBURN OR INDIGESTION Strength: 20 mg   glipiZIDE (GLUCOTROL) 10 MG tablet TAKE 2 TABLETS BY MOUTH EVERY DAY BEFOREBREAKFAST   isosorbide mononitrate (IMDUR) 30 MG 24 hr tablet Take 1 tablet (30 mg total) by mouth daily.   lisinopril (ZESTRIL) 40 MG tablet Take 1 tablet (40 mg total) by mouth daily. TAKE 1 TABLET BY MOUTH DAILY. PLEASE TAKE THIS IN THE MORNING. Strength: 40 mg   metFORMIN (GLUCOPHAGE) 1000 MG tablet Take 1.5 tablets (1,500 mg total) by mouth daily with breakfast.   metoprolol succinate (TOPROL-XL) 100 MG  24 hr tablet Take 2 tablets (200 mg total) by mouth daily. Take with or immediately following a meal.     Allergies:   Patient has no known allergies.   Social History   Socioeconomic History   Marital status: Single    Spouse name: Not on file   Number of children: 3   Years of education: Not on file   Highest education level: High school graduate  Occupational History   Occupation: unemployed  Tobacco Use   Smoking status: Some Days    Types: Cigarettes   Smokeless tobacco: Never   Tobacco comments:     quit following having teeth removed - pt reports she smokes when she drinks (edited: 08/09/2020=2)  Vaping Use   Vaping Use: Never used  Substance and Sexual Activity   Alcohol use: Yes    Alcohol/week: 1.0 standard drink of alcohol    Types: 1 Glasses of wine per week    Comment:  wine on the weekends   Drug use: No   Sexual activity: Yes  Other Topics Concern   Not on file  Social History Narrative   A friend supports. Not on food stamps. Pretty well taken care of by friend.    Social Determinants of Health   Financial Resource Strain: Low Risk  (03/31/2018)   Overall Financial Resource Strain (CARDIA)    Difficulty of Paying Living Expenses: Not very hard  Food Insecurity: No Food Insecurity (11/08/2021)   Hunger Vital Sign    Worried About Running Out of Food in the Last Year: Never true    Ran Out of Food in the Last Year: Never true  Transportation Needs: No Transportation Needs (11/08/2021)   PRAPARE - Hydrologist (Medical): No    Lack of Transportation (Non-Medical): No  Physical Activity: Unknown (03/31/2018)   Exercise Vital Sign    Days of Exercise per Week: 0 days    Minutes of Exercise per Session: Not on file  Stress: No Stress Concern Present (03/31/2018)   [place]    Feeling of Stress : Not at all  Social Connections: [place] (03/31/2018)   Social Connection and Isolation Panel [NHANES]    Frequency of Communication with Friends and Family: More than three times a week    Frequency of Social Gatherings with Friends and Family: Once a week    Attends Religious Services: More than 4 times per year    Active Member of [employer] or Organizations: Yes    Attends Music therapist: More than 4 times per year    Marital Status: Living with partner     Family History: The patient's family history includes Breast cancer in her cousin; Cancer in her maternal  aunt; Diabetes type II in her father and mother; Heart disease in her father.  ROS:   Please see the history of present illness.     All other systems reviewed and are negative.  EKGs/Labs/Other Studies Reviewed:    The following studies were reviewed today:  Cardiac stress test 04/2022 IMPRESSION: 1.  No evidence of ischemia on stress perfusion imaging   2. Severe asymmetric LV hypertrophy measuring 10mm in basal septum (62mm in posterior wall), consistent with hypertrophic cardiomyopathy   3. Patchy late gadolinium enhancement in RV insertion site and basal inferior wall, consistent with HCM. LGE accounts for 5% of total myocardial mass   4.  Normal LV size and systolic function (  EF 62%)   5.  Normal RV size and systolic function (EF 68%)    Echo 02/2022  1. Severe septal hypertrophy measuring 2.2 cm. Otherwise moderate  concentric LVH. Findings consistent with hypertrophic obstructive  cardiomyopathy. Apical gradient with peak velocity 3.52 m/s at rest. Peak  gradient 49.4 mmHg. Left ventricular ejection  fraction, by estimation, is 70 to 75%. The left ventricle has hyperdynamic  function. The left ventricle has no regional wall motion abnormalities.  There is severe left ventricular hypertrophy. Left ventricular diastolic  parameters are consistent with  Grade I diastolic dysfunction (impaired relaxation). Elevated left  ventricular end-diastolic pressure.   2. Right ventricular systolic function is normal. The right ventricular  size is normal.   3. The mitral valve is normal in structure. No evidence of mitral valve  regurgitation. No evidence of mitral stenosis.   4. The aortic valve is normal in structure. Aortic valve regurgitation is  not visualized. No aortic stenosis is present.   EKG:  EKG is not ordered today.   Recent Labs: 03/23/2022: ALT 19 05/16/2022: BUN 21; Creatinine, Ser 0.82; Hemoglobin 11.4; Platelets 287; Potassium 4.2; Sodium 143  Recent Lipid  Panel    Component Value Date/Time   CHOL 129 03/23/2022 0535   CHOL 238 (H) 11/01/2021 1820   TRIG 51 03/23/2022 0535   HDL 50 03/23/2022 0535   HDL 60 11/01/2021 1820   CHOLHDL 2.6 03/23/2022 0535   VLDL 10 03/23/2022 0535   LDLCALC 69 03/23/2022 0535   LDLCALC 150 (H) 11/01/2021 1820    Physical Exam:    VS:  BP 136/80 (BP Location: Left Arm, Patient Position: Sitting, Cuff Size: Normal)   Pulse 72   Ht 5\' 3"  (1.6 m)   Wt 162 lb (73.5 kg)   SpO2 98%   BMI 28.70 kg/m     Wt Readings from Last 3 Encounters:  08/19/22 162 lb (73.5 kg)  05/16/22 157 lb 9.6 oz (71.5 kg)  04/12/22 157 lb (71.2 kg)     GEN:  Well nourished, well developed in no acute distress HEENT: Normal NECK: No JVD; No carotid bruits LYMPHATICS: No lymphadenopathy CARDIAC: RRR, no murmurs, rubs, gallops RESPIRATORY:  Clear to auscultation without rales, wheezing or rhonchi  ABDOMEN: Soft, non-tender, non-distended MUSCULOSKELETAL:  No edema; No deformity  SKIN: Warm and dry NEUROLOGIC:  Alert and oriented x 3 PSYCHIATRIC:  Normal affect   ASSESSMENT:    1. HOCM (hypertrophic obstructive cardiomyopathy) (HCC)   2. PVC's (premature ventricular contractions)   3. Non-ST elevation (NSTEMI) myocardial infarction (HCC)   4. Coronary artery disease involving native coronary artery of native heart without angina pectoris   5. Essential hypertension   6. Hyperlipidemia, mixed    PLAN:    In order of problems listed above:  HOCM Cardiac stress MRI showed no ischemia, severe asymmetric LVH measuring 70mm in basal septum, consistent with HOCM, patchy late gadolinium enhancement in RV insertion site and basal inferior wall, consistent with HCM, LGE accounts for 5% total myocardial mass, normal LVSF. Patient was previously referred to HOCM clinic in [place], but she says they never called to schedule. We will re-refer her. Continue Toprol 200mg  daily.   PVCs/NSVT Noted during last hospitalization. A  heart monitor was ordered, but not completed. We will look into this. Continue BB therapy.   NSTEMI CAD s/p remote PCI NSTEMI during hospitalization 02/2022 and no intervention was performed, it was felt to be demand ischemia. Plan to continued DAPT for at  least 6 months. Continue Aspirin, Plavix, Lpitor, Toprol and Imdur. No further ischemic work-up indicated at this time.   HTN BP a little high, but she did not have her medications today. Continue Amlodipine 5mg  daily, Lisinopril 40mg  daily, Toprol 200mg  daily, and Imdur 30mg  daiy.   HLD LDL 69. Continue Lipitor 40mg  daily.   Disposition: Follow up in 3 month(s) with MD    Signed, Woodruff Skirvin , PA-C  08/19/2022 9:36 AM    [facility]

## 2022-08-19 NOTE — Patient Instructions (Signed)
Medication Instructions:  Your physician recommends that you continue on your current medications as directed. Please refer to the Current Medication list given to you today.  *If you need a refill on your cardiac medications before your next appointment, please call your pharmacy*   Lab Work: NONE   If you have labs (blood work) drawn today and your tests are completely normal, you will receive your results only by: [place] (if you have MyChart) OR A paper copy in the mail If you have any lab test that is abnormal or we need to change your treatment, we will call you to review the results.   Testing/Procedures: NONE    Follow-Up: At [facility], you and your health needs are our priority.  As part of our continuing mission to provide you with exceptional heart care, we have created designated Provider Care Teams.  These Care Teams include your primary Cardiologist (physician) and Advanced Practice Providers (APPs -  Physician Assistants and Nurse Practitioners) who all work together to provide you with the care you need, when you need it.  We recommend signing up for the patient portal called "MyChart".  Sign up information is provided on this After Visit Summary.  MyChart is used to connect with patients for Virtual Visits (Telemedicine).  Patients are able to view lab/test results, encounter notes, upcoming appointments, etc.  Non-urgent messages can be sent to your provider as well.   To learn more about what you can do with MyChart, go to [email or web address].    Your next appointment:   3 month(s)  The format for your next appointment:   In Person  Provider:   Nelva Bush, MD    Other Instructions Thank you for choosing [facility]!    Important Information About Sugar

## 2022-08-22 ENCOUNTER — Ambulatory Visit: Payer: Self-pay | Admitting: Gerontology

## 2022-08-22 VITALS — BP 155/74 | HR 63 | Temp 98.1°F | Ht 63.0 in | Wt 163.3 lb

## 2022-08-22 DIAGNOSIS — I25119 Atherosclerotic heart disease of native coronary artery with unspecified angina pectoris: Secondary | ICD-10-CM

## 2022-08-22 DIAGNOSIS — E119 Type 2 diabetes mellitus without complications: Secondary | ICD-10-CM

## 2022-08-22 LAB — GLUCOSE, POCT (MANUAL RESULT ENTRY): POC Glucose: 273 mg/dl — AB (ref 70–99)

## 2022-08-22 LAB — POCT GLYCOSYLATED HEMOGLOBIN (HGB A1C): Hemoglobin A1C: 7.3 % — AB (ref 4.0–5.6)

## 2022-08-22 MED ORDER — ASPIRIN 81 MG PO TBEC
81.0000 mg | DELAYED_RELEASE_TABLET | Freq: Every day | ORAL | 0 refills | Status: DC
  Filled 2022-08-22 – 2022-09-30 (×2): qty 90, 90d supply, fill #0

## 2022-08-22 NOTE — Progress Notes (Signed)
Established Patient Office Visit  Subjective   Patient ID: Laurie Kirby, female    DOB: May 02, 1961  Age: 61 y.o. MRN: [id number]  Chief Complaint  Patient presents with   Follow-up    Routine follow up   Medication Refill    Refill all medications    HPI Laurie Kirby is a 61 y/o female who has history of Type 2 diabetes mellitus, Hyperlipidemia, Hypertension presents for routine follow visit and medication refill. Her HgbA1c done on 08/22/22 decreased from 7.8% to 7.3%,and blood glucose was 273 mg/dl.  She states that she's compliant with her medications, denies side effects and continues to make healthy lifestyle changes. She denies hypo/hyperglycemic symptoms, peripheral neuropathy and performs daily foot checks.She checks her blood glucose everyother day and was last checked 4 days ago and it was 170 mg/dl. She was seen by Cardiologist on 08/19/22 by Cardence F.H PA-C BP a little high, but she did not have her medications today. Continue Amlodipine 77m daily, Lisinopril 465mdaily, Toprol 20020maily, and Imdur 60m45miy. Overall, she states that she's doing well and offers no further complaint.    Patient Active Problem List   Diagnosis Date Noted   NSTEMI (non-ST elevated myocardial infarction) (HCC)[place] Chest pain 03/23/2022   Health care maintenance 02/15/2021   History of leukocytosis 08/14/2019   Essential hypertension 12/01/2018   Right hip pain 03/12/2018   Tobacco abuse 09/03/2017   Abnormal [place] blood cell (WBC) 08/28/2017   Hyperlipidemia 01/13/2016   Abnormal Pap smear of cervix 11/30/2015   Diabetes mellitus without complication (HCC)[id number] atherosclerosis of native coronary artery 07/27/2015   Post-menopausal bleeding 03/15/2014   Past Medical History:  Diagnosis Date   CAD (coronary artery disease)    Diabetes mellitus without complication (HCC)    Hyperlipidemia    Hypertension    Hypertrophic cardiomyopathy (HCC)    Past Surgical  History:  Procedure Laterality Date   CORONARY ANGIOPLASTY WITH STENT PLACEMENT     Social History   Tobacco Use   Smoking status: Some Days    Packs/day: 0.25    Types: Cigarettes   Smokeless tobacco: Never   Tobacco comments:    quit following having teeth removed - pt reports she smokes when she drinks (edited: 08/09/2020=2)    08/22/22 pt reports approximately 4 cigarettes on the weekend  Vaping Use   Vaping Use: Never used  Substance Use Topics   Alcohol use: Yes    Alcohol/week: 1.0 standard drink of alcohol    Types: 1 Glasses of wine per week    Comment:  wine on the weekends   Drug use: No   Family History  Problem Relation Age of Onset   Diabetes type II Mother    Diabetes type II Father    Heart disease Father    Cancer Maternal Aunt    Breast cancer Cousin    No Known Allergies    Review of Systems  Constitutional: Negative.   Eyes: Negative.   Respiratory: Negative.    Cardiovascular: Negative.   Skin: Negative.   Neurological: Negative.   Endo/Heme/Allergies: Negative.   Psychiatric/Behavioral: Negative.        Objective:     BP (!) 155/74   Pulse 63   Temp 98.1 F (36.7 C)   Ht 5' 3" (1.6 m)   Wt 163 lb 4.8 oz (74.1 kg)   SpO2 96%   BMI 28.93 kg/m  BP Readings from  Last 3 Encounters:  08/22/22 (!) 155/74  08/19/22 136/80  05/16/22 114/68   Wt Readings from Last 3 Encounters:  08/22/22 163 lb 4.8 oz (74.1 kg)  08/19/22 162 lb (73.5 kg)  05/16/22 157 lb 9.6 oz (71.5 kg)      Physical Exam HENT:     Head: Normocephalic and atraumatic.     Mouth/Throat:     Mouth: Mucous membranes are moist.  Eyes:     Extraocular Movements: Extraocular movements intact.     Conjunctiva/sclera: Conjunctivae normal.     Pupils: Pupils are equal, round, and reactive to light.  Cardiovascular:     Rate and Rhythm: Normal rate and regular rhythm.     Pulses: Normal pulses.     Heart sounds: Normal heart sounds.  Pulmonary:     Effort:  Pulmonary effort is normal.     Breath sounds: Normal breath sounds.  Skin:    General: Skin is warm.  Neurological:     General: No focal deficit present.     Mental Status: She is alert and oriented to person, place, and time. Mental status is at baseline.  Psychiatric:        Mood and Affect: Mood normal.        Thought Content: Thought content normal.        Judgment: Judgment normal.      Results for orders placed or performed in visit on 08/22/22  Urine Microalbumin w/creat. ratio  Result Value Ref Range   Creatinine, Urine 273.5 Not Estab. mg/dL   Microalbumin, Urine 387.6 Not Estab. ug/mL   Microalb/Creat Ratio 142 (H) 0 - 29 mg/g creat  POCT Glucose (CBG)  Result Value Ref Range   POC Glucose 273 (A) 70 - 99 mg/dl  POCT HgB A1C  Result Value Ref Range   Hemoglobin A1C 7.3 (A) 4.0 - 5.6 %   HbA1c POC (<> result, manual entry)     HbA1c, POC (prediabetic range)     HbA1c, POC (controlled diabetic range)      Last CBC Lab Results  Component Value Date   WBC 14.4 (H) 05/16/2022   HGB 11.4 05/16/2022   HCT 34.0 05/16/2022   MCV 88 05/16/2022   MCH 29.6 05/16/2022   RDW 13.0 05/16/2022   PLT 287 16/07/9603   Last metabolic panel Lab Results  Component Value Date   GLUCOSE 104 (H) 05/16/2022   NA 143 05/16/2022   K 4.2 05/16/2022   CL 108 (H) 05/16/2022   CO2 23 05/16/2022   BUN 21 05/16/2022   CREATININE 0.82 05/16/2022   EGFR 82 05/16/2022   CALCIUM 9.7 05/16/2022   PROT 7.4 03/23/2022   ALBUMIN 3.6 03/23/2022   LABGLOB 3.2 08/02/2021   AGRATIO 1.4 08/02/2021   BILITOT 0.6 03/23/2022   ALKPHOS 53 03/23/2022   AST 19 03/23/2022   ALT 19 03/23/2022   ANIONGAP 10 03/22/2022   Last lipids Lab Results  Component Value Date   CHOL 129 03/23/2022   HDL 50 03/23/2022   LDLCALC 69 03/23/2022   TRIG 51 03/23/2022   CHOLHDL 2.6 03/23/2022   Last hemoglobin A1c Lab Results  Component Value Date   HGBA1C 7.3 (A) 08/22/2022      The ASCVD Risk  score (Arnett DK, et al., 2019) failed to calculate for the following reasons:   The patient has a prior MI or stroke diagnosis    Assessment & Plan:   1. Type 2 diabetes mellitus without complication, without  long-term current use of insulin ([place]) - Her diabetes is improving, she will continue current medication, low carb/non concentrated sweet diet and exercise as tolerated. Her goal HgbA1c should be less than 7%. - POCT HgB A1C; Future - POCT Glucose (CBG); Future - Urine Microalbumin w/creat. ratio; Future - POCT Glucose (CBG) - POCT HgB A1C - Urine Microalbumin w/creat. ratio  2. 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 - aspirin EC (ASPIRIN LOW DOSE) 81 MG tablet; Take 1 tablet (81 mg total) by mouth daily. Swallow whole.  Dispense: 90 tablet; Refill: 0   Return in about 14 weeks (around 11/28/2022), or if symptoms worsen or fail to improve.    Roselene Gray Jerold Coombe, NP

## 2022-08-22 NOTE — Progress Notes (Signed)
4 cigarettes q weekend

## 2022-08-22 NOTE — Patient Instructions (Signed)
Carbohydrate Counting for Diabetes Mellitus, Adult Carbohydrate counting is a method of keeping track of how many carbohydrates you eat. Eating carbohydrates increases the amount of sugar (glucose) in the blood. Counting how many carbohydrates you eat improves how well you manage your blood glucose. This, in turn, helps you manage your diabetes. Carbohydrates are measured in grams (g) per serving. It is important to know how many carbohydrates (in grams or by serving size) you can have in each meal. This is different for every person. A dietitian can help you make a meal plan and calculate how many carbohydrates you should have at each meal and snack. What foods contain carbohydrates? Carbohydrates are found in the following foods: Grains, such as breads and cereals. Dried beans and soy products. Starchy vegetables, such as potatoes, peas, and corn. Fruit and fruit juices. Milk and yogurt. Sweets and snack foods, such as cake, cookies, candy, chips, and soft drinks. How do I count carbohydrates in foods? There are two ways to count carbohydrates in food. You can read food labels or learn standard serving sizes of foods. You can use either of these methods or a combination of both. Using the Nutrition Facts label The Nutrition Facts list is included on the labels of almost all packaged foods and beverages in the [place]. It includes: The serving size. Information about nutrients in each serving, including the grams of carbohydrate per serving. To use the Nutrition Facts, decide how many servings you will have. Then, multiply the number of servings by the number of carbohydrates per serving. The resulting number is the total grams of carbohydrates that you will be having. Learning the standard serving sizes of foods When you eat carbohydrate foods that are not packaged or do not include Nutrition Facts on the label, you need to measure the servings in order to count the grams of  carbohydrates. Measure the foods that you will eat with a food scale or measuring cup, if needed. Decide how many standard-size servings you will eat. Multiply the number of servings by 15. For foods that contain carbohydrates, one serving equals 15 g of carbohydrates. For example, if you eat 2 cups or 10 oz (300 g) of strawberries, you will have eaten 2 servings and 30 g of carbohydrates (2 servings x 15 g = 30 g). For foods that have more than one food mixed, such as soups and casseroles, you must count the carbohydrates in each food that is included. The following list contains standard serving sizes of common carbohydrate-rich foods. Each of these servings has about 15 g of carbohydrates: 1 slice of bread. 1 six-inch (15 cm) tortilla. ? cup or 2 oz (53 g) cooked rice or pasta.  cup or 3 oz (85 g) cooked or canned, drained and rinsed beans or lentils.  cup or 3 oz (85 g) starchy vegetable, such as peas, corn, or squash.  cup or 4 oz (120 g) hot cereal.  cup or 3 oz (85 g) boiled or mashed potatoes, or  or 3 oz (85 g) of a large baked potato.  cup or 4 fl oz (118 mL) fruit juice. 1 cup or 8 fl oz (237 mL) milk. 1 small or 4 oz (106 g) apple.  or 2 oz (63 g) of a medium banana. 1 cup or 5 oz (150 g) strawberries. 3 cups or 1 oz (28.3 g) popped popcorn. What is an example of carbohydrate counting? To calculate the grams of carbohydrates in this sample meal, follow the steps   shown below. Sample meal 3 oz (85 g) chicken breast. ? cup or 4 oz (106 g) brown rice.  cup or 3 oz (85 g) corn. 1 cup or 8 fl oz (237 mL) milk. 1 cup or 5 oz (150 g) strawberries with sugar-free whipped topping. Carbohydrate calculation Identify the foods that contain carbohydrates: Rice. Corn. Milk. Strawberries. Calculate how many servings you have of each food: 2 servings rice. 1 serving corn. 1 serving milk. 1 serving strawberries. Multiply each number of servings by 15 g: 2 servings rice x 15  g = 30 g. 1 serving corn x 15 g = 15 g. 1 serving milk x 15 g = 15 g. 1 serving strawberries x 15 g = 15 g. Add together all of the amounts to find the total grams of carbohydrates eaten: 30 g + 15 g + 15 g + 15 g = 75 g of carbohydrates total. What are tips for following this plan? Shopping Develop a meal plan and then make a shopping list. Buy fresh and frozen vegetables, fresh and frozen fruit, dairy, eggs, beans, lentils, and whole grains. Look at food labels. Choose foods that have more fiber and less sugar. Avoid processed foods and foods with added sugars. Meal planning Aim to have the same number of grams of carbohydrates at each meal and for each snack time. Plan to have regular, balanced meals and snacks. Where to find more information American Diabetes Association: diabetes.org Centers for Disease Control and Prevention: cdc.gov Academy of Nutrition and Dietetics: eatright.org Association of Diabetes Care & Education Specialists: diabeteseducator.org Summary Carbohydrate counting is a method of keeping track of how many carbohydrates you eat. Eating carbohydrates increases the amount of sugar (glucose) in your blood. Counting how many carbohydrates you eat improves how well you manage your blood glucose. This helps you manage your diabetes. A dietitian can help you make a meal plan and calculate how many carbohydrates you should have at each meal and snack. This information is not intended to replace advice given to you by your health care provider. Make sure you discuss any questions you have with your health care provider. Document Revised: 05/17/2020 Document Reviewed: 05/17/2020 Elsevier Patient Education  2023 Else[employer] Inc. DASH Eating Plan DASH stands for Dietary Approaches to Stop Hypertension. The DASH eating plan is a healthy eating plan that has been shown to: Reduce high blood pressure (hypertension). Reduce your risk for type 2 diabetes, heart disease, and  stroke. Help with weight loss. What are tips for following this plan? Reading food labels Check food labels for the amount of salt (sodium) per serving. Choose foods with less than 5 percent of the Daily Value of sodium. Generally, foods with less than 300 milligrams (mg) of sodium per serving fit into this eating plan. To find whole grains, look for the word "whole" as the first word in the ingredient list. Shopping Buy products labeled as "low-sodium" or "no salt added." Buy fresh foods. Avoid canned foods and pre-made or frozen meals. Cooking Avoid adding salt when cooking. Use salt-free seasonings or herbs instead of table salt or sea salt. Check with your health care provider or pharmacist before using salt substitutes. Do not fry foods. Cook foods using healthy methods such as baking, boiling, grilling, roasting, and broiling instead. Cook with heart-healthy oils, such as olive, canola, avocado, soybean, or sunflower oil. Meal planning  Eat a balanced diet that includes: 4 or more servings of fruits and 4 or more servings of vegetables each day.   Try to fill one-half of your plate with fruits and vegetables. 6-8 servings of whole grains each day. Less than 6 oz (170 g) of lean meat, poultry, or fish each day. A 3-oz (85-g) serving of meat is about the same size as a deck of cards. One egg equals 1 oz (28 g). 2-3 servings of low-fat dairy each day. One serving is 1 cup (237 mL). 1 serving of nuts, seeds, or beans 5 times each week. 2-3 servings of heart-healthy fats. Healthy fats called omega-3 fatty acids are found in foods such as walnuts, flaxseeds, fortified milks, and eggs. These fats are also found in cold-water fish, such as sardines, salmon, and mackerel. Limit how much you eat of: Canned or prepackaged foods. Food that is high in trans fat, such as some fried foods. Food that is high in saturated fat, such as fatty meat. Desserts and other sweets, sugary drinks, and other foods  with added sugar. Full-fat dairy products. Do not salt foods before eating. Do not eat more than 4 egg yolks a week. Try to eat at least 2 vegetarian meals a week. Eat more home-cooked food and less restaurant, buffet, and fast food. Lifestyle When eating at a restaurant, ask that your food be prepared with less salt or no salt, if possible. If you drink alcohol: Limit how much you use to: 0-1 drink a day for women who are not pregnant. 0-2 drinks a day for men. Be aware of how much alcohol is in your drink. In the [place] one drink equals one 12 oz bottle of beer (355 mL), one 5 oz glass of wine (148 mL), or one 1 oz glass of hard liquor (44 mL). General information Avoid eating more than 2,300 mg of salt a day. If you have hypertension, you may need to reduce your sodium intake to 1,500 mg a day. Work with your health care provider to maintain a healthy body weight or to lose weight. Ask what an ideal weight is for you. Get at least 30 minutes of exercise that causes your heart to beat faster (aerobic exercise) most days of the week. Activities may include walking, swimming, or biking. Work with your health care provider or dietitian to adjust your eating plan to your individual calorie needs. What foods should I eat? Fruits All fresh, dried, or frozen fruit. Canned fruit in natural juice (without added sugar). Vegetables Fresh or frozen vegetables (raw, steamed, roasted, or grilled). Low-sodium or reduced-sodium tomato and vegetable juice. Low-sodium or reduced-sodium tomato sauce and tomato paste. Low-sodium or reduced-sodium canned vegetables. Grains Whole-grain or whole-wheat bread. Whole-grain or whole-wheat pasta. Brown rice. Oatmeal. Quinoa. Bulgur. Whole-grain and low-sodium cereals. Pita bread. Low-fat, low-sodium crackers. Whole-wheat flour tortillas. Meats and other proteins Skinless chicken or turkey. Ground chicken or turkey. Pork with fat trimmed off. Fish and seafood. Egg  whites. Dried beans, peas, or lentils. Unsalted nuts, nut butters, and seeds. Unsalted canned beans. Lean cuts of beef with fat trimmed off. Low-sodium, lean precooked or cured meat, such as sausages or meat loaves. Dairy Low-fat (1%) or fat-free (skim) milk. Reduced-fat, low-fat, or fat-free cheeses. Nonfat, low-sodium ricotta or cottage cheese. Low-fat or nonfat yogurt. Low-fat, low-sodium cheese. Fats and oils Soft margarine without trans fats. Vegetable oil. Reduced-fat, low-fat, or light mayonnaise and salad dressings (reduced-sodium). Canola, safflower, olive, avocado, soybean, and sunflower oils. Avocado. Seasonings and condiments Herbs. Spices. Seasoning mixes without salt. Other foods Unsalted popcorn and pretzels. Fat-free sweets. The items listed above may not be   a complete list of foods and beverages you can eat. Contact a dietitian for more information. What foods should I avoid? Fruits Canned fruit in a light or heavy syrup. Fried fruit. Fruit in cream or butter sauce. Vegetables Creamed or fried vegetables. Vegetables in a cheese sauce. Regular canned vegetables (not low-sodium or reduced-sodium). Regular canned tomato sauce and paste (not low-sodium or reduced-sodium). Regular tomato and vegetable juice (not low-sodium or reduced-sodium). Pickles. Olives. Grains Baked goods made with fat, such as croissants, muffins, or some breads. Dry pasta or rice meal packs. Meats and other proteins Fatty cuts of meat. Ribs. Fried meat. Bacon. Bologna, salami, and other precooked or cured meats, such as sausages or meat loaves. Fat from the back of a pig (fatback). Bratwurst. Salted nuts and seeds. Canned beans with added salt. Canned or smoked fish. Whole eggs or egg yolks. Chicken or turkey with skin. Dairy Whole or 2% milk, cream, and half-and-half. Whole or full-fat cream cheese. Whole-fat or sweetened yogurt. Full-fat cheese. Nondairy creamers. Whipped toppings. Processed cheese and  cheese spreads. Fats and oils Butter. Stick margarine. Lard. Shortening. Ghee. Bacon fat. Tropical oils, such as coconut, palm kernel, or palm oil. Seasonings and condiments Onion salt, garlic salt, seasoned salt, table salt, and sea salt. Worcestershire sauce. Tartar sauce. Barbecue sauce. Teriyaki sauce. Soy sauce, including reduced-sodium. Steak sauce. Canned and packaged gravies. Fish sauce. Oyster sauce. Cocktail sauce. Store-bought horseradish. Ketchup. Mustard. Meat flavorings and tenderizers. Bouillon cubes. Hot sauces. Pre-made or packaged marinades. Pre-made or packaged taco seasonings. Relishes. Regular salad dressings. Other foods Salted popcorn and pretzels. The items listed above may not be a complete list of foods and beverages you should avoid. Contact a dietitian for more information. Where to find more information National Heart, Lung, and Blood Institute: www.nhlbi.nih.gov American Heart Association: www.heart.org Academy of Nutrition and Dietetics: www.eatright.org National Kidney Foundation: www.kidney.org Summary The DASH eating plan is a healthy eating plan that has been shown to reduce high blood pressure (hypertension). It may also reduce your risk for type 2 diabetes, heart disease, and stroke. When on the DASH eating plan, aim to eat more fresh fruits and vegetables, whole grains, lean proteins, low-fat dairy, and heart-healthy fats. With the DASH eating plan, you should limit salt (sodium) intake to 2,300 mg a day. If you have hypertension, you may need to reduce your sodium intake to 1,500 mg a day. Work with your health care provider or dietitian to adjust your eating plan to your individual calorie needs. This information is not intended to replace advice given to you by your health care provider. Make sure you discuss any questions you have with your health care provider. Document Revised: 09/17/2019 Document Reviewed: 09/17/2019 Elsevier Patient Education  2023  Elsevier Inc.  

## 2022-08-23 ENCOUNTER — Other Ambulatory Visit: Payer: Self-pay

## 2022-08-25 LAB — MICROALBUMIN / CREATININE URINE RATIO
Creatinine, Urine: 273.5 mg/dL
Microalb/Creat Ratio: 142 mg/g creat — ABNORMAL HIGH (ref 0–29)
Microalbumin, Urine: 387.6 ug/mL

## 2022-08-29 ENCOUNTER — Ambulatory Visit: Payer: Self-pay | Admitting: Internal Medicine

## 2022-09-12 ENCOUNTER — Other Ambulatory Visit: Payer: Self-pay

## 2022-09-16 ENCOUNTER — Encounter: Payer: Self-pay | Admitting: Internal Medicine

## 2022-09-16 ENCOUNTER — Ambulatory Visit: Payer: Self-pay | Attending: Internal Medicine | Admitting: Internal Medicine

## 2022-09-16 VITALS — BP 130/68 | HR 66 | Ht 63.0 in | Wt 162.0 lb

## 2022-09-16 DIAGNOSIS — I422 Other hypertrophic cardiomyopathy: Secondary | ICD-10-CM

## 2022-09-16 NOTE — Progress Notes (Signed)
Cardiology Office Note:    Date:  09/16/2022   ID:  Laurie GivensBeverly R Kirby, DOB [birth year], MRN [id number]  PCP:  Pcp, No   [facility] Providers Cardiologist:  Julien Nordmannimothy Gollan, MD     Referring MD: Marianne SofiaFurth, Cadence H, PA-C   CC: Doesn't know why she is here Consulted for the evaluation of oHCM at the behest of Cadence Furth- [place] C  History of Present Illness:    Laurie Kirby is a 61 y.o. female with a hx of CAD, HT, and DM. On Stress CMR she was found to have septal thickness of 21 mm and LGE ~ 5%.LVOT Gradient not well interrogated by ~ 20 mm Hg. Septal hypertrophy is greatest.  There was no aneurysm.  There was no LVOT gradient on CMR.  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   She run around and take care of them with no issues. Patient notes no SOB at rest and no DOE Notes no fatigue. Notes no palpitations Notes no CP. Notes no Dizziness. Notes no syncope. Notable family events include having three kids. No HCM in the family No SCD.   Her father had a prior bypass (likely).  She is still smoking.  Mainly smokes when you drink.  She is pre-comtemplative.    She is feeling well. She is eating chicken wings in the office and has no complaints. She denies having the CMR. She sent back the heart monitor back from June but has never received results.   Thought the visit she has tele-conferenced her sister in the visit.   Past Medical History:  Diagnosis Date   CAD (coronary artery disease)    Diabetes mellitus without complication (HCC)    Hyperlipidemia    Hypertension    Hypertrophic cardiomyopathy (HCC)     Past Surgical History:  Procedure Laterality Date   CORONARY ANGIOPLASTY WITH STENT PLACEMENT      Current Medications: Current Meds  Medication Sig   amLODipine (NORVASC) 5 MG tablet Take 1 tablet (5 mg total) by mouth daily.   Ascorbic Acid (VITAMIN C) 1000 MG tablet Take 1,000 mg by mouth daily.   aspirin EC (ASPIRIN LOW DOSE) 81 MG tablet  Take 1 tablet (81 mg total) by mouth daily. Swallow whole.   atorvastatin (LIPITOR) 40 MG tablet Take 2 tablets (80 mg total) by mouth daily.   clopidogrel (PLAVIX) 75 MG tablet Take 1 tablet (75 mg total) by mouth daily.   famotidine (PEPCID) 20 MG tablet Take 1 tablet (20 mg total) by mouth 2 (two) times daily as needed for heartburn or indigestion. TAKE 1 TABLET BY MOUTH 2 TIMES DAILY AS NEEDED FOR HEARTBURN OR INDIGESTION Strength: 20 mg   glipiZIDE (GLUCOTROL) 10 MG tablet TAKE 2 TABLETS BY MOUTH EVERY DAY BEFOREBREAKFAST   isosorbide mononitrate (IMDUR) 30 MG 24 hr tablet Take 1 tablet (30 mg total) by mouth daily.   lisinopril (ZESTRIL) 40 MG tablet Take 1 tablet (40 mg total) by mouth daily. TAKE 1 TABLET BY MOUTH DAILY. PLEASE TAKE THIS IN THE MORNING. Strength: 40 mg   metFORMIN (GLUCOPHAGE) 1000 MG tablet Take 1.5 tablets (1,500 mg total) by mouth daily with breakfast.   metoprolol succinate (TOPROL-XL) 100 MG 24 hr tablet Take 2 tablets (200 mg total) by mouth daily. Take with or immediately following a meal.     Allergies:   Patient has no known allergies.   Social History   Socioeconomic History   Marital status: Single  Spouse name: Not on file   Number of children: 3   Years of education: Not on file   Highest education level: High school graduate  Occupational History   Occupation: unemployed  Tobacco Use   Smoking status: Some Days    Packs/day: 0.25    Types: Cigarettes   Smokeless tobacco: Never   Tobacco comments:    quit following having teeth removed - pt reports she smokes when she drinks (edited: 08/09/2020=2)    08/22/22 pt reports approximately 4 cigarettes on the weekend  Vaping Use   Vaping Use: Never used  Substance and Sexual Activity   Alcohol use: Yes    Alcohol/week: 1.0 standard drink of alcohol    Types: 1 Glasses of wine per week    Comment:  wine on the weekends   Drug use: No   Sexual activity: Yes  Other Topics Concern   Not on file   Social History Narrative   A friend supports. Not on food stamps. Pretty well taken care of by friend.    Social Determinants of Health   Financial Resource Strain: Low Risk  (03/31/2018)   Overall Financial Resource Strain (CARDIA)    Difficulty of Paying Living Expenses: Not very hard  Food Insecurity: No Food Insecurity (08/22/2022)   Hunger Vital Sign    Worried About Running Out of Food in the Last Year: Never true    Ran Out of Food in the Last Year: Never true  Transportation Needs: No Transportation Needs (08/22/2022)   PRAPARE - Administrator, Civil Service (Medical): No    Lack of Transportation (Non-Medical): No  Physical Activity: Unknown (03/31/2018)   Exercise Vital Sign    Days of Exercise per Week: 0 days    Minutes of Exercise per Session: Not on file  Stress: No Stress Concern Present (03/31/2018)   [employer] of Occupational Health - Occupational Stress Questionnaire    Feeling of Stress : Not at all  Social Connections: Socially Integrated (03/31/2018)   Social Connection and Isolation Panel [NHANES]    Frequency of Communication with Friends and Family: More than three times a week    Frequency of Social Gatherings with Friends and Family: Once a week    Attends Religious Services: More than 4 times per year    Active Member of [employer] or Organizations: Yes    Attends Engineer, structural: More than 4 times per year    Marital Status: Living with partner    Family History: The patient's family history includes Breast cancer in her cousin; Cancer in her maternal aunt; Diabetes type II in her father and mother; Heart disease in her father.  ROS:   Please see the history of present illness.     All other systems reviewed and are negative.  EKGs/Labs/Other Studies Reviewed:    The following studies were reviewed today:  Cardiac Studies & Procedures     STRESS TESTS  NM MYOCAR MULTI W/SPECT W 07/14/2011  Narrative *RADIOLOGY  REPORT*  Clinical Data:  History of CAD  MYOCARDIAL IMAGING WITH SPECT (REST AND PHARMACOLOGIC-STRESS) GATED LEFT VENTRICULAR WALL MOTION STUDY LEFT VENTRICULAR EJECTION FRACTION  Technique:  Standard myocardial SPECT imaging was performed after resting intravenous injection of 10 mCi Tc-70m tetrofosmin. Subsequently, intravenous infusion of Lexiscan was performed under the supervision of the Cardiology staff.  At peak effect of the drug, 30 mCi Tc-30m tetrofosmin was injected intravenously and standard myocardial SPECT imaging was performed.  Quantitative gated  imaging was also performed to evaluate left ventricular wall motion and estimate left ventricular ejection fraction.  Comparison:  None.  Findings:  SPECT imaging demonstrates homogeneous distribution without evidence of infarction or pharmacological inducible ischemia.  Quantitative gated analysis shows normal wall motion.  The resting left ventricular ejection fraction is 62% with end- diastolic volume of 83 ml and end-systolic volume of 32 ml.  IMPRESSION: 1.  No evidence of infarction or pharmacological inducible ischemia.  2.  Normal wall motion.  Ejection fraction - 62%.  Original Report Authenticated By: Waynard Reeds, M.D.   ECHOCARDIOGRAM  ECHOCARDIOGRAM COMPLETE 03/23/2022  Narrative ECHOCARDIOGRAM REPORT    Patient Name:   NEDDA GAINS Wadley Date of Exam: 03/23/2022 Medical Rec #:  [id number]       Height:       63.0 in Accession #:    [id number]      Weight:       158.3 lb Date of Birth:  [birth year]       BSA:          1.751 m Patient Age:    60 years        BP:           189/88 mmHg Patient Gender: F               HR:           106 bpm. Exam Location:  [facility]  Procedure: 2D Echo  Indications:     NSTEMI I21.4  History:         Patient has no prior history of Echocardiogram examinations.  Sonographer:     Overton Mam RDCS Referring Phys:  [id number] Eliezer Mccoy PATEL Diagnosing Phys: Chilton Si MD  IMPRESSIONS   1. Severe septal hypertrophy measuring 2.2 cm. Otherwise moderate concentric LVH. Findings consistent with hypertrophic obstructive cardiomyopathy. Apical gradient with peak velocity 3.52 m/s at rest. Peak gradient 49.4 mmHg. Left ventricular ejection fraction, by estimation, is 70 to 75%. The left ventricle has hyperdynamic function. The left ventricle has no regional wall motion abnormalities. There is severe left ventricular hypertrophy. Left ventricular diastolic parameters are consistent with Grade I diastolic dysfunction (impaired relaxation). Elevated left ventricular end-diastolic pressure. 2. Right ventricular systolic function is normal. The right ventricular size is normal. 3. The mitral valve is normal in structure. No evidence of mitral valve regurgitation. No evidence of mitral stenosis. 4. The aortic valve is normal in structure. Aortic valve regurgitation is not visualized. No aortic stenosis is present.  FINDINGS Left Ventricle: Severe septal hypertrophy measuring 2.2 cm. Otherwise moderate concentric LVH. Findings consistent with hypertrophic obstructive cardiomyopathy. Apical gradient with peak velocity 3.52 m/s at rest. Peak gradient 49.4 mmHg. Left ventricular ejection fraction, by estimation, is 70 to 75%. The left ventricle has hyperdynamic function. The left ventricle has no regional wall motion abnormalities. The left ventricular internal cavity size was normal in size. There is severe left ventricular hypertrophy. Left ventricular diastolic parameters are consistent with Grade I diastolic dysfunction (impaired relaxation). Elevated left ventricular end-diastolic pressure.  Right Ventricle: The right ventricular size is normal. No increase in right ventricular wall thickness. Right ventricular systolic function is normal.  Left Atrium: Left atrial size was normal in size.  Right Atrium: Right atrial size was normal in size.  Pericardium:  There is no evidence of pericardial effusion.  Mitral Valve: The mitral valve is normal in structure. No evidence of mitral valve regurgitation. No evidence of mitral valve stenosis.  Tricuspid Valve: The tricuspid valve is normal in structure. Tricuspid valve regurgitation is trivial. No evidence of tricuspid stenosis.  Aortic Valve: The aortic valve is normal in structure. Aortic valve regurgitation is not visualized. No aortic stenosis is present. Aortic valve mean gradient measures 8.0 mmHg. Aortic valve peak gradient measures 16.2 mmHg. Aortic valve area, by VTI measures 3.78 cm.  Pulmonic Valve: The pulmonic valve was normal in structure. Pulmonic valve regurgitation is trivial. No evidence of pulmonic stenosis.  Aorta: The aortic root is normal in size and structure.  Venous: The inferior vena cava was not well visualized.  IAS/Shunts: No atrial level shunt detected by color flow Doppler.   LEFT VENTRICLE PLAX 2D LVIDd:         3.90 cm     Diastology LVIDs:         2.60 cm     LV e' medial:    4.35 cm/s LV PW:         1.50 cm     LV E/e' medial:  18.2 LV IVS:        2.20 cm     LV e' lateral:   4.46 cm/s LVOT diam:     2.20 cm     LV E/e' lateral: 17.8 LV SV:         116 LV SV Index:   66 LVOT Area:     3.80 cm  LV Volumes (MOD) LV vol d, MOD A2C: 50.1 ml LV vol d, MOD A4C: 30.2 ml LV vol s, MOD A2C: 17.7 ml LV vol s, MOD A4C: 12.0 ml LV SV MOD A2C:     32.4 ml LV SV MOD A4C:     30.2 ml LV SV MOD BP:      26.0 ml  RIGHT VENTRICLE RV Basal diam:  2.60 cm RV S prime:     22.20 cm/s  LEFT ATRIUM             Index        RIGHT ATRIUM          Index LA diam:        3.30 cm 1.88 cm/m   RA Area:     9.34 cm LA Vol (A2C):   56.1 ml 32.04 ml/m  RA Volume:   20.20 ml 11.54 ml/m LA Vol (A4C):   33.8 ml 19.31 ml/m LA Biplane Vol: 44.3 ml 25.30 ml/m AORTIC VALVE                     PULMONIC VALVE AV Area (Vmax):    3.37 cm      PV Vmax:       0.91 m/s AV Area  (Vmean):   3.47 cm      PV Peak grad:  3.3 mmHg AV Area (VTI):     3.78 cm AV Vmax:           201.00 cm/s AV Vmean:          127.000 cm/s AV VTI:            0.306 m AV Peak Grad:      16.2 mmHg AV Mean Grad:      8.0 mmHg LVOT Vmax:         178.00 cm/s LVOT Vmean:        116.000 cm/s LVOT VTI:          0.304 m LVOT/AV VTI ratio: 0.99  AORTA Ao Root diam: 3.20 cm  Ao Asc diam:  3.00 cm  MITRAL VALVE                TRICUSPID VALVE MV Area (PHT): 4.46 cm     TR Peak grad:   18.1 mmHg MV Decel Time: 170 msec     TR Vmax:        213.00 cm/s MV E velocity: 79.30 cm/s MV A velocity: 109.00 cm/s  SHUNTS MV E/A ratio:  0.73         Systemic VTI:  0.30 m Systemic Diam: 2.20 cm  Chilton Si MD Electronically signed by Chilton Si MD Signature Date/Time: 03/23/2022/2:29:10 PM    Final              Recent Labs: 03/23/2022: ALT 19 05/16/2022: BUN 21; Creatinine, Ser 0.82; Hemoglobin 11.4; Platelets 287; Potassium 4.2; Sodium 143  Recent Lipid Panel    Component Value Date/Time   CHOL 129 03/23/2022 0535   CHOL 238 (H) 11/01/2021 1820   TRIG 51 03/23/2022 0535   HDL 50 03/23/2022 0535   HDL 60 11/01/2021 1820   CHOLHDL 2.6 03/23/2022 0535   VLDL 10 03/23/2022 0535   LDLCALC 69 03/23/2022 0535   LDLCALC 150 (H) 11/01/2021 1820    Physical Exam:    VS:  BP 130/68   Pulse 66   Ht  (1.6 m)   Wt 162 lb (73.5 kg)   SpO2 98%   BMI 28.70 kg/m     Wt Readings from Last 3 Encounters:  09/16/22 162 lb (73.5 kg)  08/22/22 163 lb 4.8 oz (74.1 kg)  08/19/22 162 lb (73.5 kg)     GEN:  Well nourished, well developed in no acute distress HEENT: Normal NECK: No JVD; No carotid bruits LYMPHATICS: No lymphadenopathy CARDIAC: RRR, systolic murmur, no rubs, gallops RESPIRATORY:  Clear to auscultation without rales, wheezing or rhonchi  ABDOMEN: Soft, non-tender, non-distended MUSCULOSKELETAL:  No edema; No deformity  SKIN: Warm and dry NEUROLOGIC:  Alert and  oriented x 3 PSYCHIATRIC:  Normal affect   ASSESSMENT:    1. Hypertrophic cardiomyopathy (HCC)    PLAN:    Hypertrophic Cardiomyopathy - septal Variant; non obstructive - with systolic murmur - NYHA I - no family history, she has four sisters and three children - she has little insight to her disease process: we discussed at length and conferenced her sister in - will do echo based screening for now - reaching out to our staff: we need her heart monitor results she send in for June - POET for exercise induced ectopy - based on this will see in f/u for SCD/ICD discussion   Time Spent Directly with Patient:   I have spent a total of 40 minutes with the patient reviewing notes, imaging, EKGs, labs and examining the patient as well as establishing an assessment and plan that was discussed personally with the patient.  > 50% of time was spent in direct patient care and family and reviewing imaging with patient.      Medication Adjustments/Labs and Tests Ordered: Current medicines are reviewed at length with the patient today.  Concerns regarding medicines are outlined above.  Orders Placed This Encounter  Procedures   EXERCISE TOLERANCE TEST (ETT)   EKG 12-Lead   No orders of the defined types were placed in this encounter.   Patient Instructions  Medication Instructions:  Your physician recommends that you continue on your current medications as directed. Please refer to the Current Medication list given  to you today.  *If you need a refill on your cardiac medications before your next appointment, please call your pharmacy*   Lab Work: NONE If you have labs (blood work) drawn today and your tests are completely normal, you will receive your results only by: MyChart Message (if you have MyChart) OR A paper copy in the mail If you have any lab test that is abnormal or we need to change your treatment, we will call you to review the results.   Testing/Procedures: IN  [place]: Your physician has requested that you have an exercise tolerance test. For further information please visit [email or web address] Please also follow instruction sheet, as given.    Follow-Up: At [facility], you and your health needs are our priority.  As part of our continuing mission to provide you with exceptional heart care, we have created designated Provider Care Teams.  These Care Teams include your primary Cardiologist (physician) and Advanced Practice Providers (APPs -  Physician Assistants and Nurse Practitioners) who all work together to provide you with the care you need, when you need it.  We recommend signing up for the patient portal called "MyChart".  Sign up information is provided on this After Visit Summary.  MyChart is used to connect with patients for Virtual Visits (Telemedicine).  Patients are able to view lab/test results, encounter notes, upcoming appointments, etc.  Non-urgent messages can be sent to your provider as well.   To learn more about what you can do with MyChart, go to [email or web address].    Your next appointment:   6 month(s)  The format for your next appointment:   In Person  Provider:   [name], MD   Important Information About Sugar         Signed, Christell Constant, MD  09/16/2022 1:09 PM    [facility]

## 2022-09-16 NOTE — Patient Instructions (Addendum)
Medication Instructions:  Your physician recommends that you continue on your current medications as directed. Please refer to the Current Medication list given to you today.  *If you need a refill on your cardiac medications before your next appointment, please call your pharmacy*   Lab Work: NONE If you have labs (blood work) drawn today and your tests are completely normal, you will receive your results only by: MyChart Message (if you have MyChart) OR A paper copy in the mail If you have any lab test that is abnormal or we need to change your treatment, we will call you to review the results.   Testing/Procedures: IN [place]: Your physician has requested that you have an exercise tolerance test. For further information please visit [email or web address] Please also follow instruction sheet, as given.    Follow-Up: At [facility], you and your health needs are our priority.  As part of our continuing mission to provide you with exceptional heart care, we have created designated Provider Care Teams.  These Care Teams include your primary Cardiologist (physician) and Advanced Practice Providers (APPs -  Physician Assistants and Nurse Practitioners) who all work together to provide you with the care you need, when you need it.  We recommend signing up for the patient portal called "MyChart".  Sign up information is provided on this After Visit Summary.  MyChart is used to connect with patients for Virtual Visits (Telemedicine).  Patients are able to view lab/test results, encounter notes, upcoming appointments, etc.  Non-urgent messages can be sent to your provider as well.   To learn more about what you can do with MyChart, go to [email or web address].    Your next appointment:   6 month(s)  The format for your next appointment:   In Person  Provider:   [name], MD   Important Information About Sugar

## 2022-09-17 ENCOUNTER — Ambulatory Visit: Payer: Self-pay | Attending: Internal Medicine

## 2022-09-17 DIAGNOSIS — I422 Other hypertrophic cardiomyopathy: Secondary | ICD-10-CM

## 2022-09-18 ENCOUNTER — Telehealth: Payer: Self-pay | Admitting: Internal Medicine

## 2022-09-18 DIAGNOSIS — I421 Obstructive hypertrophic cardiomyopathy: Secondary | ICD-10-CM

## 2022-09-18 LAB — EXERCISE TOLERANCE TEST
Angina Index: 0
Estimated workload: 4.6
Exercise duration (min): 3 min
Exercise duration (sec): 0 s
MPHR: 160 {beats}/min
Peak HR: 80 {beats}/min
Percent HR: 50 %
RPE: 15
Rest HR: 62 {beats}/min

## 2022-09-18 NOTE — Telephone Encounter (Signed)
-----   Message from Christell Constant, MD sent at 09/18/2022  4:35 PM EST ----- Yes thanks.  Same indication (HCM). ----- Message ----- From: Franchot Gallo Sent: 09/18/2022   3:18 PM EST To: Christell Constant, MD  Okay, just to confirm, do you want to do another 14 day Zio?  ----- Message ----- From: Christell Constant, MD Sent: 09/18/2022   3:11 PM EST To: Demetrios Loll L  She needs another heart monitor ----- Message ----- From: Franchot Gallo Sent: 09/18/2022   2:31 PM EST To: Christell Constant, MD  Patient states she was told that her heart monitor she wore in June was not turned on and did not record anything during the time she wore it.  Will forward to Dr. Izora Ribas to review.  The patient has been notified of the result and verbalized understanding.  All questions (if any) were answered. Franchot Gallo, RN 09/18/2022 2:29 PM

## 2022-09-18 NOTE — Telephone Encounter (Signed)
Spoke with patient and discussed Dr. Debby Bud recommendation to wear another 14 day Zio heart monitor.  14 day Zio heart monitor ordered for clinic enrollment.  Patient verbalized understanding and expressed appreciation for follow-up.

## 2022-09-23 ENCOUNTER — Encounter: Payer: Self-pay | Admitting: *Deleted

## 2022-09-23 ENCOUNTER — Other Ambulatory Visit: Payer: Self-pay | Admitting: Internal Medicine

## 2022-09-23 ENCOUNTER — Ambulatory Visit: Payer: Self-pay | Attending: Internal Medicine

## 2022-09-23 DIAGNOSIS — I421 Obstructive hypertrophic cardiomyopathy: Secondary | ICD-10-CM

## 2022-09-23 DIAGNOSIS — I493 Ventricular premature depolarization: Secondary | ICD-10-CM

## 2022-09-23 NOTE — Telephone Encounter (Signed)
ZIO patch monitor enrolled to be mailed to patients' home.  Shel will call our office to schedule appointment to have it applied at our [facility] location once she receives it, which should be in 1-2 days.

## 2022-09-23 NOTE — Progress Notes (Unsigned)
Enrolled for [employer] to mail a ZIO XT long term holter monitor to the patients address on file.  Letter with instructions mailed to patient. 

## 2022-09-27 DIAGNOSIS — I493 Ventricular premature depolarization: Secondary | ICD-10-CM

## 2022-09-27 DIAGNOSIS — I421 Obstructive hypertrophic cardiomyopathy: Secondary | ICD-10-CM

## 2022-09-30 ENCOUNTER — Other Ambulatory Visit: Payer: Self-pay

## 2022-12-05 ENCOUNTER — Ambulatory Visit: Payer: Self-pay

## 2022-12-09 DIAGNOSIS — Q248 Other specified congenital malformations of heart: Secondary | ICD-10-CM | POA: Insufficient documentation

## 2022-12-09 NOTE — Progress Notes (Deleted)
NO SHOW

## 2022-12-10 ENCOUNTER — Encounter: Payer: Self-pay | Admitting: Cardiovascular Disease

## 2022-12-10 ENCOUNTER — Ambulatory Visit: Payer: Self-pay | Admitting: Medical

## 2022-12-10 ENCOUNTER — Ambulatory Visit: Payer: Self-pay | Attending: Medical | Admitting: Cardiovascular Disease

## 2022-12-10 DIAGNOSIS — I2510 Atherosclerotic heart disease of native coronary artery without angina pectoris: Secondary | ICD-10-CM

## 2022-12-10 DIAGNOSIS — I10 Essential (primary) hypertension: Secondary | ICD-10-CM

## 2022-12-10 DIAGNOSIS — Q248 Other specified congenital malformations of heart: Secondary | ICD-10-CM

## 2022-12-10 DIAGNOSIS — Z720 Tobacco use: Secondary | ICD-10-CM

## 2022-12-10 DIAGNOSIS — E782 Mixed hyperlipidemia: Secondary | ICD-10-CM

## 2022-12-10 DIAGNOSIS — I421 Obstructive hypertrophic cardiomyopathy: Secondary | ICD-10-CM

## 2022-12-12 ENCOUNTER — Ambulatory Visit: Payer: Self-pay | Admitting: General Practice

## 2022-12-12 VITALS — BP 103/55 | HR 70 | Wt 159.0 lb

## 2022-12-12 DIAGNOSIS — E119 Type 2 diabetes mellitus without complications: Secondary | ICD-10-CM

## 2022-12-12 DIAGNOSIS — E785 Hyperlipidemia, unspecified: Secondary | ICD-10-CM

## 2022-12-12 DIAGNOSIS — I25119 Atherosclerotic heart disease of native coronary artery with unspecified angina pectoris: Secondary | ICD-10-CM

## 2022-12-12 DIAGNOSIS — I10 Essential (primary) hypertension: Secondary | ICD-10-CM

## 2022-12-12 DIAGNOSIS — K219 Gastro-esophageal reflux disease without esophagitis: Secondary | ICD-10-CM

## 2022-12-12 LAB — POCT GLYCOSYLATED HEMOGLOBIN (HGB A1C): Hemoglobin A1C: 8.3 % — AB (ref 4.0–5.6)

## 2022-12-12 LAB — GLUCOSE, POCT (MANUAL RESULT ENTRY): POC Glucose: 106 mg/dl — AB (ref 70–99)

## 2022-12-12 MED ORDER — AMLODIPINE BESYLATE 5 MG PO TABS
5.0000 mg | ORAL_TABLET | Freq: Every day | ORAL | 1 refills | Status: DC
  Filled 2022-12-12: qty 30, 30d supply, fill #0
  Filled 2023-02-06: qty 30, 30d supply, fill #1

## 2022-12-12 MED ORDER — METFORMIN HCL 1000 MG PO TABS
1500.0000 mg | ORAL_TABLET | Freq: Every day | ORAL | 0 refills | Status: DC
  Filled 2022-12-12: qty 45, 30d supply, fill #0

## 2022-12-12 MED ORDER — ASPIRIN 81 MG PO TBEC
81.0000 mg | DELAYED_RELEASE_TABLET | Freq: Every day | ORAL | 0 refills | Status: DC
  Filled 2022-12-12: qty 30, 30d supply, fill #0
  Filled 2023-02-06: qty 30, 30d supply, fill #1
  Filled 2023-11-26: qty 30, 30d supply, fill #2

## 2022-12-12 MED ORDER — CLOPIDOGREL BISULFATE 75 MG PO TABS
75.0000 mg | ORAL_TABLET | Freq: Every day | ORAL | 1 refills | Status: DC
  Filled 2022-12-12: qty 30, 30d supply, fill #0
  Filled 2023-02-06: qty 30, 30d supply, fill #1

## 2022-12-12 MED ORDER — ATORVASTATIN CALCIUM 40 MG PO TABS
80.0000 mg | ORAL_TABLET | Freq: Every day | ORAL | 1 refills | Status: DC
  Filled 2022-12-12: qty 60, 30d supply, fill #0
  Filled 2023-02-06: qty 60, 30d supply, fill #1

## 2022-12-12 MED ORDER — ISOSORBIDE MONONITRATE ER 30 MG PO TB24
30.0000 mg | ORAL_TABLET | Freq: Every day | ORAL | 1 refills | Status: DC
  Filled 2022-12-12: qty 30, 30d supply, fill #0
  Filled 2023-02-06: qty 30, 30d supply, fill #1

## 2022-12-12 MED ORDER — LISINOPRIL 40 MG PO TABS
40.0000 mg | ORAL_TABLET | Freq: Every day | ORAL | 1 refills | Status: DC
  Filled 2022-12-12: qty 30, 30d supply, fill #0
  Filled 2023-02-06: qty 30, 30d supply, fill #1

## 2022-12-12 MED ORDER — FAMOTIDINE 20 MG PO TABS
20.0000 mg | ORAL_TABLET | Freq: Two times a day (BID) | ORAL | 1 refills | Status: DC | PRN
  Filled 2022-12-12: qty 60, 30d supply, fill #0
  Filled 2023-02-06: qty 60, 30d supply, fill #1

## 2022-12-12 MED ORDER — GLIPIZIDE 10 MG PO TABS
20.0000 mg | ORAL_TABLET | Freq: Every day | ORAL | 1 refills | Status: DC
  Filled 2022-12-12: qty 60, 30d supply, fill #0
  Filled 2023-02-06: qty 60, 30d supply, fill #1

## 2022-12-12 MED ORDER — METOPROLOL SUCCINATE ER 100 MG PO TB24
200.0000 mg | ORAL_TABLET | Freq: Every day | ORAL | 1 refills | Status: DC
  Filled 2022-12-12: qty 60, 30d supply, fill #0
  Filled 2023-02-06: qty 60, 30d supply, fill #1

## 2022-12-12 NOTE — Progress Notes (Signed)
Established Patient Office Visit  Subjective   Patient ID: Laurie Kirby, female    DOB: Aug 21, 1961  Age: 62 y.o. MRN: [id number]  No chief complaint on file.   Laurie Kirby is a 62 y/o female who has history of Type 2 diabetes mellitus, Hyperlipidemia, Hypertension presents for routine follow visit and medication refill. Her HgbA1c done on today has increased from 7.3%, to 8.3 % and blood glucose was 106 mg/dl.  She states that she's compliant with her medications mostly, pt reports missing about 3 day per week of taking her medications.  pt denies side effects and continues to make healthy lifestyle changes. She denies hypo/hyperglycemic symptoms, peripheral neuropathy and performs daily foot checks. She denies checking her blood glucose, exercising, and only eats one meal per day.  Overall, she states that she's doing well and offers no further complaint.    Patient Active Problem List   Diagnosis Date Noted   Congenital asymmetric septal hypertrophy 12/09/2022   NSTEMI (non-ST elevated myocardial infarction) [facility])    Chest pain 03/23/2022   Health care maintenance 02/15/2021   History of leukocytosis 08/14/2019   Essential hypertension 12/01/2018   Right hip pain 03/12/2018   Tobacco abuse 09/03/2017   Abnormal Demas blood cell (WBC) 08/28/2017   Hyperlipidemia 01/13/2016   Abnormal Pap smear of cervix 11/30/2015   Diabetes mellitus without complication ([place]) [id number]   Coronary atherosclerosis of native coronary artery 07/27/2015   Post-menopausal bleeding 03/15/2014   Past Medical History:  Diagnosis Date   CAD (coronary artery disease)    Diabetes mellitus without complication ([place])    Hyperlipidemia    Hypertension    Hypertrophic cardiomyopathy (HCC)    Past Surgical History:  Procedure Laterality Date   CORONARY ANGIOPLASTY WITH STENT PLACEMENT     Social History   Tobacco Use   Smoking status: Some Days    Packs/day: 0.25    Types: Cigarettes    Smokeless tobacco: Never   Tobacco comments:    quit following having teeth removed - pt reports she smokes when she drinks (edited: 08/09/2020=2)    08/22/22 pt reports approximately 4 cigarettes on the weekend  Vaping Use   Vaping Use: Never used  Substance Use Topics   Alcohol use: Yes    Alcohol/week: 1.0 standard drink of alcohol    Types: 1 Glasses of wine per week    Comment:  wine on the weekends   Drug use: No   Social History   Socioeconomic History   Marital status: Single    Spouse name: Not on file   Number of children: 3   Years of education: Not on file   Highest education level: High school graduate  Occupational History   Occupation: unemployed  Tobacco Use   Smoking status: Some Days    Packs/day: 0.25    Types: Cigarettes   Smokeless tobacco: Never   Tobacco comments:    quit following having teeth removed - pt reports she smokes when she drinks (edited: 08/09/2020=2)    08/22/22 pt reports approximately 4 cigarettes on the weekend  Vaping Use   Vaping Use: Never used  Substance and Sexual Activity   Alcohol use: Yes    Alcohol/week: 1.0 standard drink of alcohol    Types: 1 Glasses of wine per week    Comment:  wine on the weekends   Drug use: No   Sexual activity: Yes  Other Topics Concern   Not on file  Social History  Narrative   A friend supports. Not on food stamps. Pretty well taken care of by friend.    Social Determinants of Health   Financial Resource Strain: Low Risk  (03/31/2018)   Overall Financial Resource Strain (CARDIA)    Difficulty of Paying Living Expenses: Not very hard  Food Insecurity: No Food Insecurity (08/22/2022)   Hunger Vital Sign    Worried About Running Out of Food in the Last Year: Never true    Ran Out of Food in the Last Year: Never true  Transportation Needs: No Transportation Needs (08/22/2022)   PRAPARE - Hydrologist (Medical): No    Lack of Transportation (Non-Medical): No   Physical Activity: Unknown (03/31/2018)   Exercise Vital Sign    Days of Exercise per Week: 0 days    Minutes of Exercise per Session: Not on file  Stress: No Stress Concern Present (03/31/2018)   [place]    Feeling of Stress : Not at all  Social Connections: [place] (03/31/2018)   Social Connection and Isolation Panel [NHANES]    Frequency of Communication with Friends and Family: More than three times a week    Frequency of Social Gatherings with Friends and Family: Once a week    Attends Religious Services: More than 4 times per year    Active Member of [employer] or Organizations: Yes    Attends Archivist Meetings: More than 4 times per year    Marital Status: Living with partner  Intimate Partner Violence: Not At Risk (03/31/2018)   Humiliation, Afraid, Rape, and Kick questionnaire    Fear of Current or Ex-Partner: No    Emotionally Abused: No    Physically Abused: No    Sexually Abused: No   Family Status  Relation Name Status   Mother  Deceased   Father  Deceased   Mat Aunt  (Not Specified)   Cousin maternal Alive   Family History  Problem Relation Age of Onset   Diabetes type II Mother    Diabetes type II Father    Heart disease Father    Cancer Maternal Aunt    Breast cancer Cousin    No Known Allergies    Review of Systems  Constitutional: Negative.   HENT: Negative.    Respiratory: Negative.    Cardiovascular: Negative.   Gastrointestinal: Negative.   Genitourinary: Negative.   Musculoskeletal: Negative.   Skin: Negative.   Neurological: Negative.   Endo/Heme/Allergies: Negative.   Psychiatric/Behavioral: Negative.        Objective:     BP (!) 103/55 (BP Location: Right Arm, Patient Position: Sitting, Cuff Size: Large)   Pulse 70   Wt 159 lb (72.1 kg)   BMI 28.17 kg/m  BP Readings from Last 3 Encounters:  12/12/22 (!) 103/55  09/16/22 130/68  08/22/22 (!)  155/74   Wt Readings from Last 3 Encounters:  12/12/22 159 lb (72.1 kg)  09/16/22 162 lb (73.5 kg)  08/22/22 163 lb 4.8 oz (74.1 kg)      Physical Exam Constitutional:      Appearance: Normal appearance.  HENT:     Head: Normocephalic and atraumatic.  Cardiovascular:     Rate and Rhythm: Normal rate and regular rhythm.     Pulses: Normal pulses.     Heart sounds: Normal heart sounds.  Pulmonary:     Effort: Pulmonary effort is normal.     Breath sounds: Normal  breath sounds.  Abdominal:     General: Abdomen is flat.     Palpations: Abdomen is soft.  Musculoskeletal:     Cervical back: Normal range of motion and neck supple.  Skin:    General: Skin is warm and dry.  Neurological:     General: No focal deficit present.     Mental Status: She is alert and oriented to person, place, and time.  Psychiatric:        Mood and Affect: Mood normal.        Behavior: Behavior normal.      No results found for any visits on 12/12/22.  Last hemoglobin A1c Lab Results  Component Value Date   HGBA1C 7.3 (A) 08/22/2022      The ASCVD Risk score (Arnett DK, et al., 2019) failed to calculate for the following reasons:   The patient has a prior MI or stroke diagnosis    Assessment & Plan:     1. Type 2 diabetes mellitus without complication, without long-term current use of insulin (HCC) Encourage diet and exercise and taking medications as directed.  Discussed with patient about eating more than 1 meal per day and decreasing carb intake.   BG today was Hgb A1C was 8.3%  - POCT HgB A1C; Future - POCT Glucose (CBG); Future - metFORMIN (GLUCOPHAGE) 1000 MG tablet; Take 1.5 tablets (1,500 mg total) by mouth daily with breakfast.  Dispense: 270 tablet; Refill: 0  2. Essential hypertension Encourage diet and exercise and taking medications as directed.  Encourage to limit salt and fried food intake  - amLODipine (NORVASC) 5 MG tablet; Take 1 tablet (5 mg total) by mouth  daily.  Dispense: 90 tablet; Refill: 1 - lisinopril (ZESTRIL) 40 MG tablet; Take 1 tablet (40 mg total) by mouth daily. TAKE 1 TABLET BY MOUTH DAILY. PLEASE TAKE THIS IN THE MORNING. Strength: 40 mg  Dispense: 90 tablet; Refill: 1 - metoprolol succinate (TOPROL-XL) 100 MG 24 hr tablet; Take 2 tablets (200 mg total) by mouth daily. Take with or immediately following a meal.  Dispense: 180 tablet; Refill: 1  3. 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 Swallow whole.  Dispense: 90 tablet; Refill: 0 - clopidogrel (PLAVIX) 75 MG tablet; Take 1 tablet (75 mg total) by mouth daily.  Dispense: 90 tablet; Refill: 1 - isosorbide mononitrate (IMDUR) 30 MG 24 hr tablet; Take 1 tablet (30 mg total) by mouth daily.  Dispense: 90 tablet; Refill: 1  4. Hyperlipidemia, unspecified hyperlipidemia type Encourage diet and exercise and taking medications as directed.   - atorvastatin (LIPITOR) 40 MG tablet; Take 2 tablets (80 mg total) by mouth daily.  Dispense: 90 tablet; Refill: 1  5. Gastroesophageal reflux disease without esophagitis Encourage diet and exercise and taking medications as directed.  Reduce fried food, spicy food intake   - famotidine (PEPCID) 20 MG tablet; Take 1 tablet (20 mg total) by mouth 2 (two) times daily as needed for heartburn or indigestion. TAKE 1 TABLET BY MOUTH 2 TIMES DAILY AS NEEDED FOR HEARTBURN OR INDIGESTION Strength: 20 mg  Dispense: 180 tablet; Refill: 1 6. Diabetes mellitus without complication ([place]) Encourage diet and exercise and taking medications as directed.   - glipiZIDE (GLUCOTROL) 10 MG tablet; TAKE 2 TABLETS BY MOUTH EVERY DAY BEFOREBREAKFAST  Dispense: 180 tablet; Refill: 1 - metFORMIN (  GLUCOPHAGE) 1000 MG tablet; Take 1.5 tablets (1,500 mg total) by mouth daily with breakfast.  Dispense: 270  tablet; Refill: 0  Return in about 3 months (around 03/12/2023) for follow up.    Junious Dresser, FNP

## 2022-12-13 ENCOUNTER — Other Ambulatory Visit: Payer: Self-pay

## 2022-12-25 IMAGING — CR DG CHEST 2V
2 series · 2 of 2 positions shown · non-contrast
Comparison: 07/13/2011

CLINICAL DATA: Chest pain

EXAM:
CHEST - 2 VIEW

[chest pa]
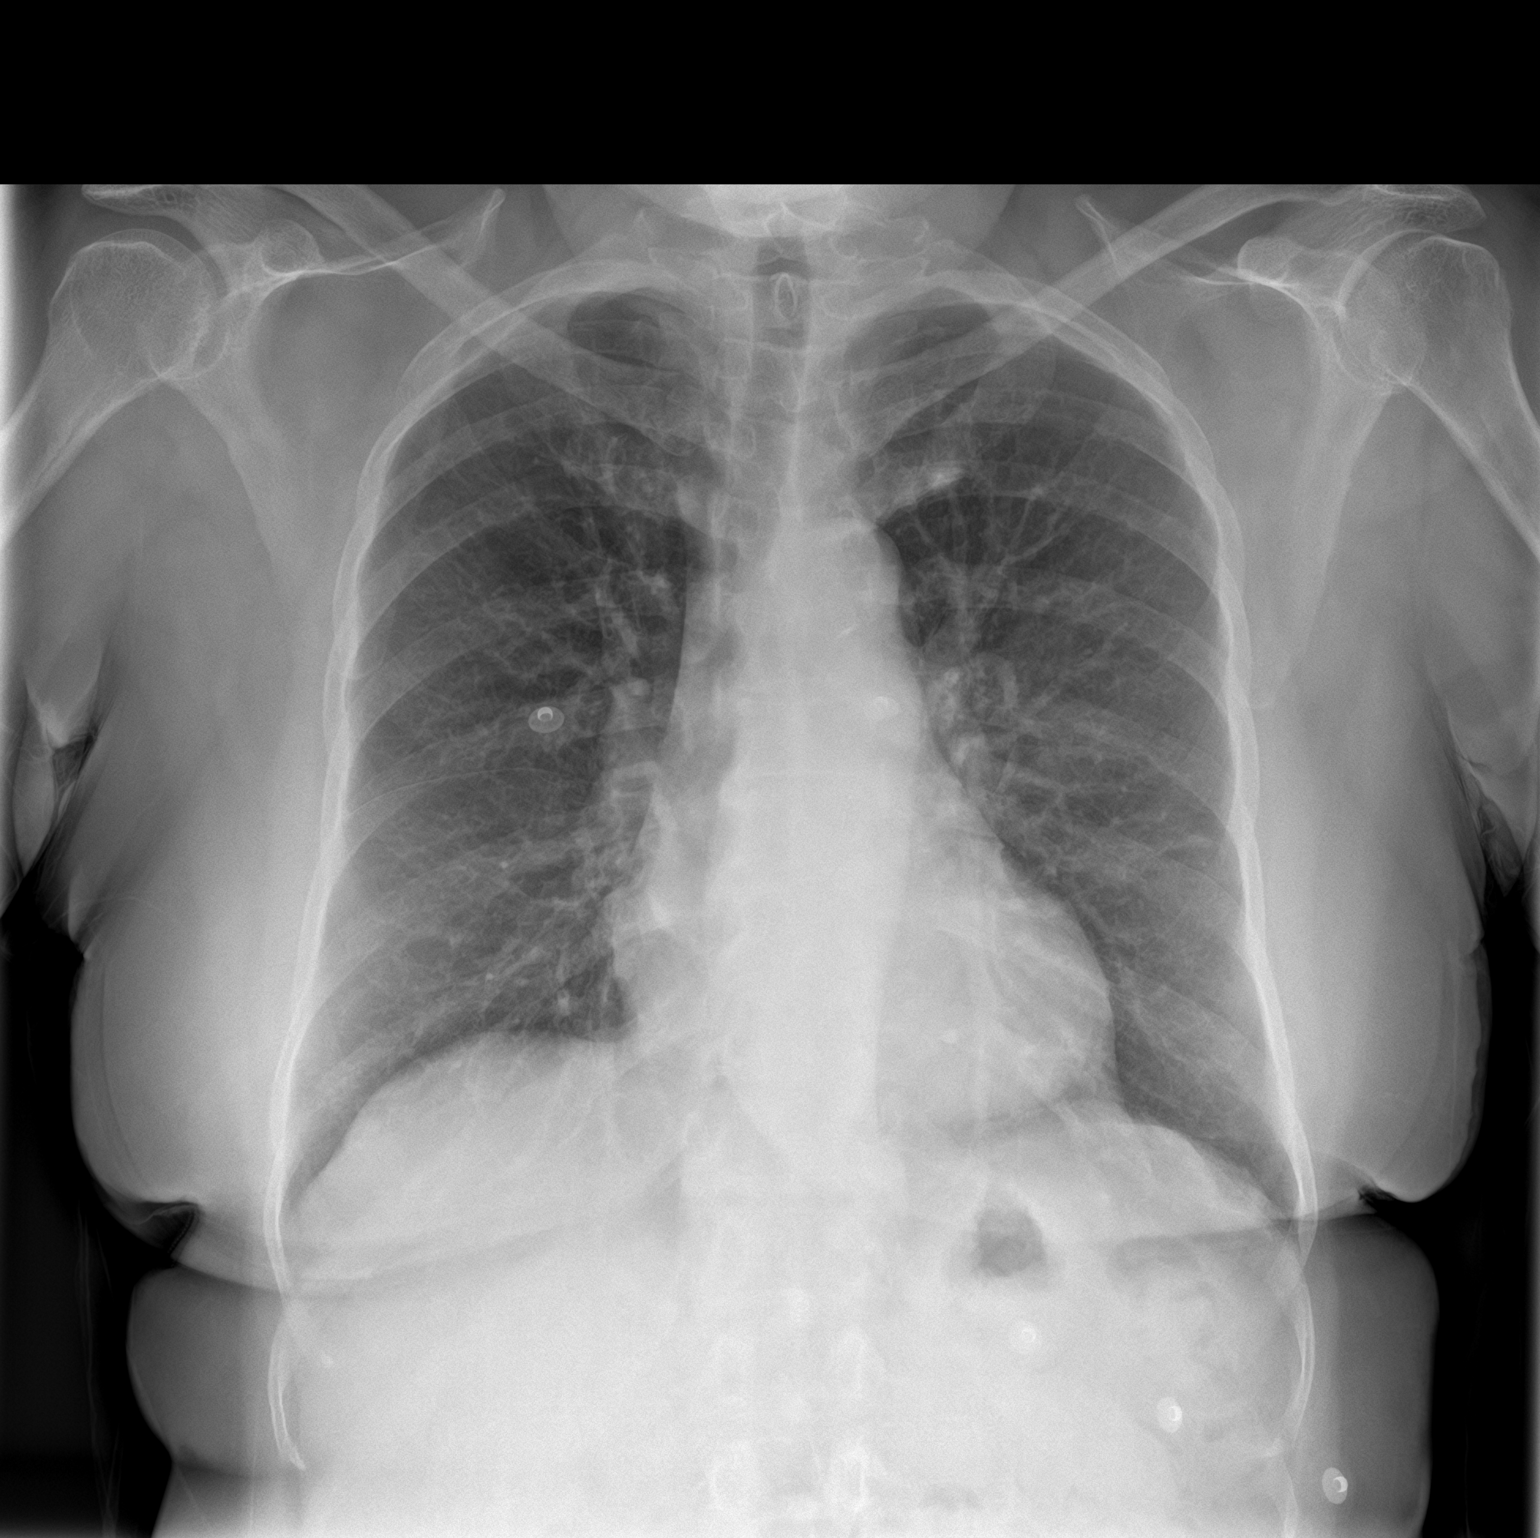

[chest lat]
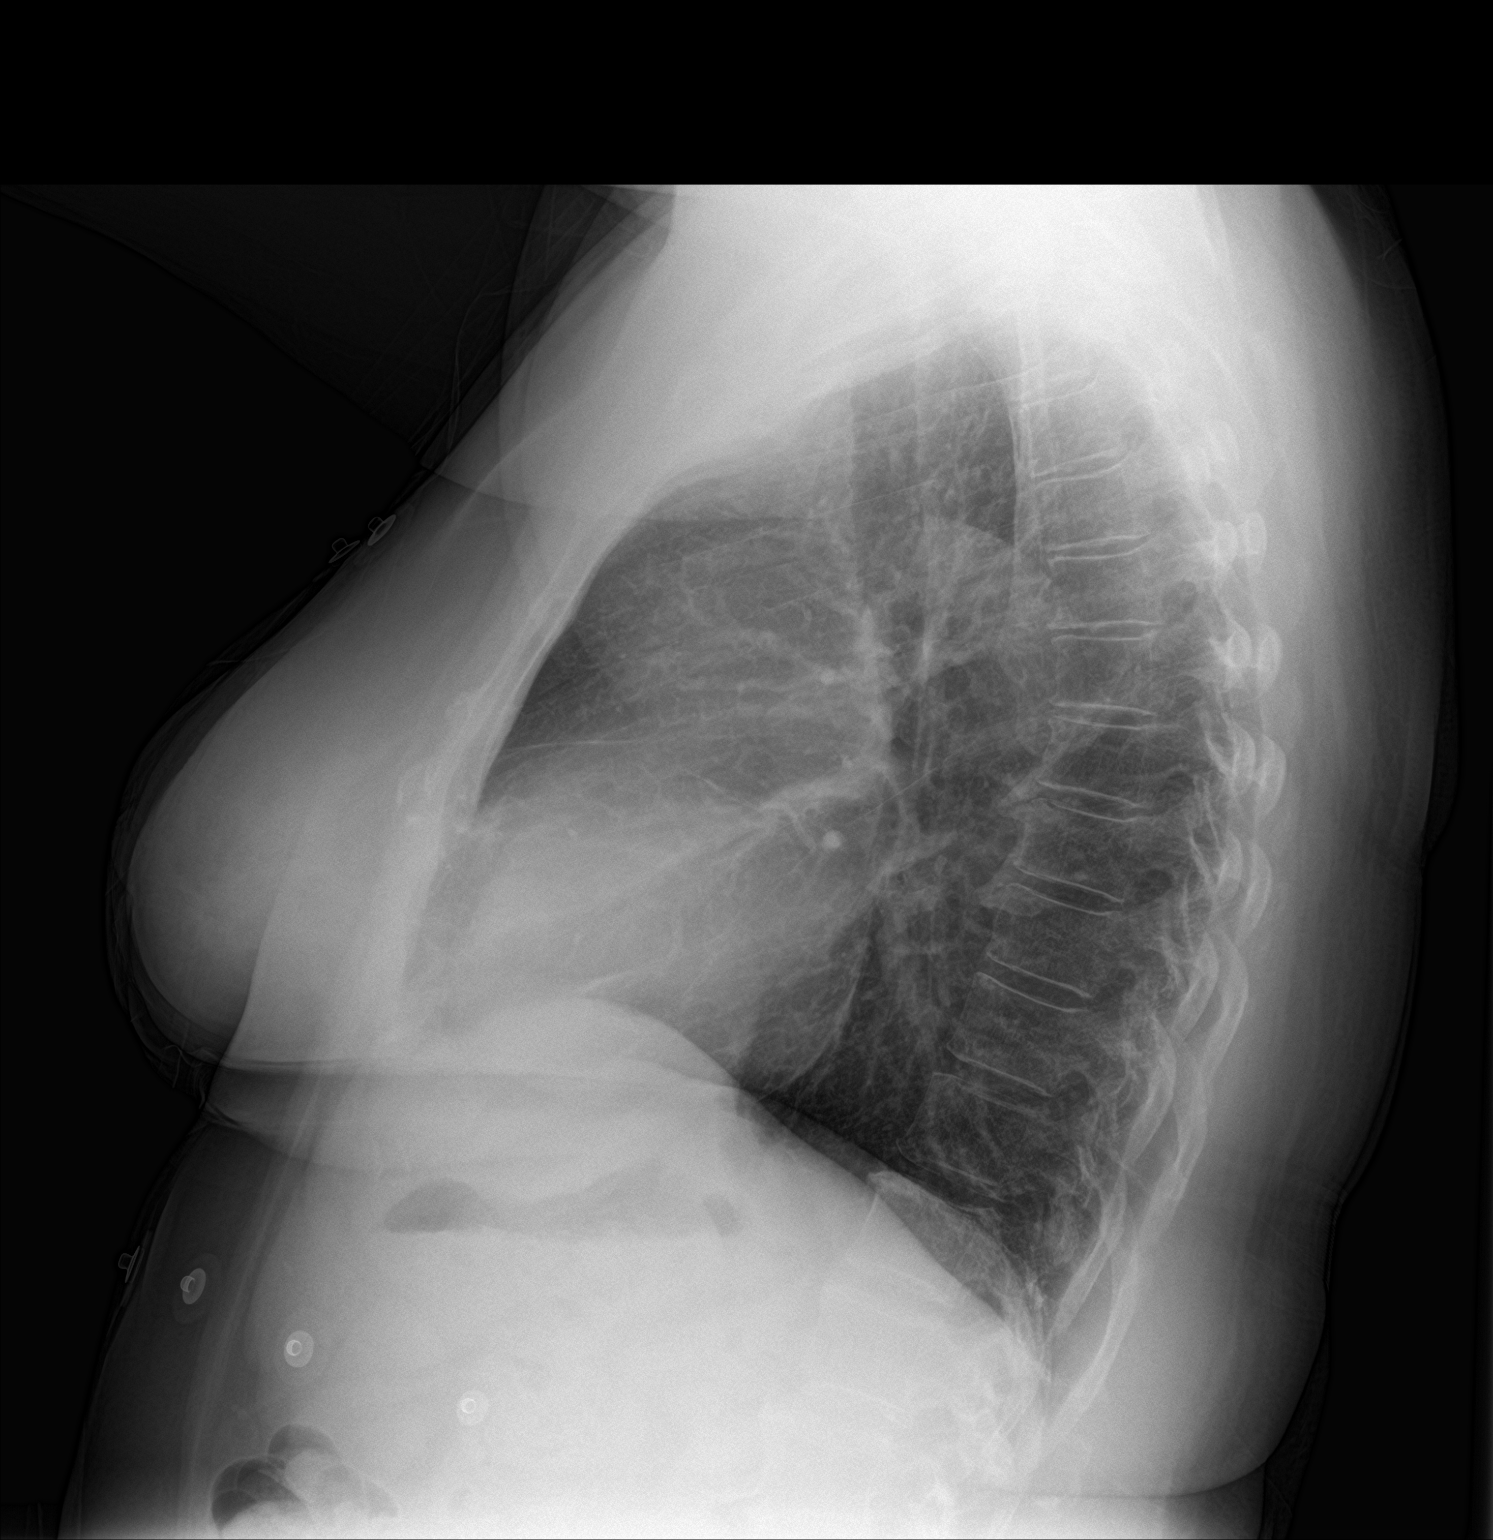

[2 of 2 positions shown; findings below may reference images not displayed]

FINDINGS: The heart size and mediastinal contours are within normal limits.
Mild atherosclerosis. Both lungs are clear. The visualized skeletal
structures are unremarkable.
IMPRESSION: No active cardiopulmonary disease.

## 2023-02-06 ENCOUNTER — Other Ambulatory Visit: Payer: Self-pay

## 2023-02-07 ENCOUNTER — Other Ambulatory Visit: Payer: Self-pay

## 2023-02-12 ENCOUNTER — Other Ambulatory Visit: Payer: Self-pay

## 2023-02-26 ENCOUNTER — Other Ambulatory Visit: Payer: Self-pay

## 2023-02-27 ENCOUNTER — Other Ambulatory Visit: Payer: Self-pay

## 2023-03-13 ENCOUNTER — Ambulatory Visit: Payer: Self-pay | Admitting: Gerontology

## 2023-03-13 ENCOUNTER — Encounter: Payer: Self-pay | Admitting: Gerontology

## 2023-03-13 VITALS — BP 147/68 | HR 70 | Wt 156.7 lb

## 2023-03-13 DIAGNOSIS — E119 Type 2 diabetes mellitus without complications: Secondary | ICD-10-CM

## 2023-03-13 DIAGNOSIS — E785 Hyperlipidemia, unspecified: Secondary | ICD-10-CM

## 2023-03-13 DIAGNOSIS — I25119 Atherosclerotic heart disease of native coronary artery with unspecified angina pectoris: Secondary | ICD-10-CM

## 2023-03-13 DIAGNOSIS — K219 Gastro-esophageal reflux disease without esophagitis: Secondary | ICD-10-CM

## 2023-03-13 DIAGNOSIS — I10 Essential (primary) hypertension: Secondary | ICD-10-CM

## 2023-03-13 LAB — POCT GLYCOSYLATED HEMOGLOBIN (HGB A1C): Hemoglobin A1C: 7.5 % — AB (ref 4.0–5.6)

## 2023-03-13 LAB — GLUCOSE, POCT (MANUAL RESULT ENTRY): POC Glucose: 90 mg/dl (ref 70–99)

## 2023-03-13 MED ORDER — GLIPIZIDE 10 MG PO TABS
20.0000 mg | ORAL_TABLET | Freq: Every day | ORAL | 1 refills | Status: DC
Start: 2023-03-13 — End: 2023-09-11
  Filled 2023-03-13: qty 180, 90d supply, fill #0
  Filled 2023-07-08: qty 180, 90d supply, fill #1

## 2023-03-13 MED ORDER — CLOPIDOGREL BISULFATE 75 MG PO TABS
75.0000 mg | ORAL_TABLET | Freq: Every day | ORAL | 1 refills | Status: DC
Start: 2023-03-13 — End: 2023-09-11
  Filled 2023-03-13: qty 90, 90d supply, fill #0
  Filled 2023-07-08: qty 90, 90d supply, fill #1

## 2023-03-13 MED ORDER — AMLODIPINE BESYLATE 5 MG PO TABS
5.0000 mg | ORAL_TABLET | Freq: Every day | ORAL | 1 refills | Status: DC
Start: 2023-03-13 — End: 2023-09-11
  Filled 2023-03-13: qty 90, 90d supply, fill #0
  Filled 2023-07-08: qty 90, 90d supply, fill #1

## 2023-03-13 MED ORDER — LISINOPRIL 40 MG PO TABS
40.0000 mg | ORAL_TABLET | Freq: Every day | ORAL | 1 refills | Status: DC
Start: 2023-03-13 — End: 2023-09-11
  Filled 2023-03-13: qty 90, 90d supply, fill #0
  Filled 2023-07-08: qty 90, 90d supply, fill #1

## 2023-03-13 MED ORDER — ISOSORBIDE MONONITRATE ER 30 MG PO TB24
30.0000 mg | ORAL_TABLET | Freq: Every day | ORAL | 1 refills | Status: DC
Start: 2023-03-13 — End: 2023-09-11
  Filled 2023-03-13: qty 90, 90d supply, fill #0
  Filled 2023-07-08: qty 90, 90d supply, fill #1

## 2023-03-13 MED ORDER — METOPROLOL SUCCINATE ER 100 MG PO TB24
200.0000 mg | ORAL_TABLET | Freq: Every day | ORAL | 1 refills | Status: DC
Start: 2023-03-13 — End: 2023-09-11
  Filled 2023-03-13: qty 180, 90d supply, fill #0
  Filled 2023-07-08: qty 180, 90d supply, fill #1

## 2023-03-13 MED ORDER — ATORVASTATIN CALCIUM 40 MG PO TABS
80.0000 mg | ORAL_TABLET | Freq: Every day | ORAL | 1 refills | Status: DC
Start: 2023-03-13 — End: 2023-06-12
  Filled 2023-03-13: qty 90, 45d supply, fill #0
  Filled 2023-05-08: qty 90, 45d supply, fill #1

## 2023-03-13 MED ORDER — FAMOTIDINE 20 MG PO TABS
20.0000 mg | ORAL_TABLET | Freq: Two times a day (BID) | ORAL | 1 refills | Status: DC | PRN
Start: 2023-03-13 — End: 2023-09-11
  Filled 2023-03-13: qty 180, 90d supply, fill #0
  Filled 2023-07-08: qty 180, 90d supply, fill #1

## 2023-03-13 MED ORDER — METFORMIN HCL 1000 MG PO TABS
1500.0000 mg | ORAL_TABLET | Freq: Every day | ORAL | 0 refills | Status: DC
Start: 2023-03-13 — End: 2023-06-12
  Filled 2023-03-13: qty 135, 90d supply, fill #0

## 2023-03-13 NOTE — Progress Notes (Signed)
Established Patient Office Visit  Subjective   Patient ID: Laurie Kirby, female    DOB: 1961-01-23  Age: 62 y.o. MRN: [id number]  No chief complaint on file.   HPI  Laurie Kirby is a 62 y/o female who has history of Type 2 diabetes mellitus, Hyperlipidemia, Hypertension presents for routine follow visit and medication refill. She states that she's compliant with her medications, denies side effects and continues to make healthy lifestyle changes. Her HgbA1c checked during visit decreased from 8.3% to 7.5 %, and her blood glucose was 90 mg/dl. She doesn't check her blood glucose as she should. She denies hypo/hyperglycemic symptoms, peripheral neuropathy and performs daily foot checks. Overall, she states that she's doing well and offers no further complaint.    Patient Active Problem List   Diagnosis Date Noted   Congenital asymmetric septal hypertrophy 12/09/2022   NSTEMI (non-ST elevated myocardial infarction) [facility])    Chest pain 03/23/2022   Health care maintenance 02/15/2021   History of leukocytosis 08/14/2019   Essential hypertension 12/01/2018   Right hip pain 03/12/2018   Tobacco abuse 09/03/2017   Abnormal [place] blood cell (WBC) 08/28/2017   Hyperlipidemia 01/13/2016   Abnormal Pap smear of cervix 11/30/2015   Diabetes mellitus without complication (HCC) 07/27/2015   Coronary atherosclerosis of native coronary artery 07/27/2015   Post-menopausal bleeding 03/15/2014   Past Medical History:  Diagnosis Date   CAD (coronary artery disease)    Diabetes mellitus without complication (HCC)    Hyperlipidemia    Hypertension    Hypertrophic cardiomyopathy (HCC)    Past Surgical History:  Procedure Laterality Date   CORONARY ANGIOPLASTY WITH STENT PLACEMENT     Social History   Tobacco Use   Smoking status: Some Days    Packs/day: .25    Types: Cigarettes   Smokeless tobacco: Never   Tobacco comments:    quit following having teeth removed - pt reports she  smokes when she drinks (edited: 08/09/2020=2)    08/22/22 pt reports approximately 4 cigarettes on the weekend  Vaping Use   Vaping Use: Never used  Substance Use Topics   Alcohol use: Yes    Alcohol/week: 1.0 standard drink of alcohol    Types: 1 Glasses of wine per week    Comment:  wine on the weekends   Drug use: No   Family History  Problem Relation Age of Onset   Diabetes type II Mother    Diabetes type II Father    Heart disease Father    Cancer Maternal Aunt    Breast cancer Cousin    No Known Allergies    Review of Systems  Constitutional: Negative.   Eyes: Negative.   Respiratory: Negative.    Cardiovascular: Negative.   Gastrointestinal: Negative.   Neurological: Negative.   Endo/Heme/Allergies: Negative.   Psychiatric/Behavioral: Negative.        Objective:     BP (!) 147/68 (BP Location: Right Arm, Patient Position: Sitting, Cuff Size: Large)   Pulse 70   Wt 156 lb 11.2 oz (71.1 kg)   BMI 27.76 kg/m  BP Readings from Last 3 Encounters:  03/13/23 (!) 147/68  12/12/22 (!) 103/55  09/16/22 130/68   Wt Readings from Last 3 Encounters:  03/13/23 156 lb 11.2 oz (71.1 kg)  12/12/22 159 lb (72.1 kg)  09/16/22 162 lb (73.5 kg)      Physical Exam HENT:     Head: Normocephalic and atraumatic.     Mouth/Throat:  Mouth: Mucous membranes are moist.  Eyes:     Extraocular Movements: Extraocular movements intact.     Conjunctiva/sclera: Conjunctivae normal.     Pupils: Pupils are equal, round, and reactive to light.  Cardiovascular:     Rate and Rhythm: Normal rate and regular rhythm.     Pulses: Normal pulses.     Heart sounds: Normal heart sounds.  Pulmonary:     Effort: Pulmonary effort is normal.     Breath sounds: Normal breath sounds.  Skin:    General: Skin is warm.  Neurological:     General: No focal deficit present.     Mental Status: She is alert and oriented to person, place, and time. Mental status is at baseline.  Psychiatric:         Mood and Affect: Mood normal.        Behavior: Behavior normal.        Thought Content: Thought content normal.        Judgment: Judgment normal.      Results for orders placed or performed in visit on 03/13/23  POCT Glucose (CBG)  Result Value Ref Range   POC Glucose 90 70 - 99 mg/dl  POCT HgB [id number]  Result Value Ref Range   Hemoglobin A1C 7.5 (A) 4.0 - 5.6 %   HbA1c POC (<> result, manual entry)     HbA1c, POC (prediabetic range)     HbA1c, POC (controlled diabetic range)      Last CBC Lab Results  Component Value Date   WBC 14.4 (H) 05/16/2022   HGB 11.4 05/16/2022   HCT 34.0 05/16/2022   MCV 88 05/16/2022   MCH 29.6 05/16/2022   RDW 13.0 05/16/2022   PLT 287 05/16/2022   Last metabolic panel Lab Results  Component Value Date   GLUCOSE 104 (H) 05/16/2022   NA 143 05/16/2022   K 4.2 05/16/2022   CL 108 (H) 05/16/2022   CO2 23 05/16/2022   BUN 21 05/16/2022   CREATININE 0.82 05/16/2022   EGFR 82 05/16/2022   CALCIUM 9.7 05/16/2022   PROT 7.4 03/23/2022   ALBUMIN 3.6 03/23/2022   LABGLOB 3.2 08/02/2021   AGRATIO 1.4 08/02/2021   BILITOT 0.6 03/23/2022   ALKPHOS 53 03/23/2022   AST 19 03/23/2022   ALT 19 03/23/2022   ANIONGAP 10 03/22/2022   Last lipids Lab Results  Component Value Date   CHOL 129 03/23/2022   HDL 50 03/23/2022   LDLCALC 69 03/23/2022   TRIG 51 03/23/2022   CHOLHDL 2.6 03/23/2022   Last hemoglobin A1c Lab Results  Component Value Date   HGBA1C 7.5 (A) 03/13/2023   Last thyroid functions Lab Results  Component Value Date   TSH 1.720 01/11/2021   Last vitamin D No results found for: "25OHVITD2", "25OHVITD3", "VD25OH" Last vitamin B12 and Folate No results found for: "VITAMINB12", "FOLATE"    The ASCVD Risk score (Arnett DK, et al., 2019) failed to calculate for the following reasons:   The patient has a prior MI or stroke diagnosis    Assessment & Plan:   1. Type 2 diabetes mellitus without complication, without  long-term current use of insulin (HCC) - Her diabetes is improving, her HgbA1c was 7.5% and her goal should be less than 7%. She will continue on current medication, low carb/non concentrated sweet diet and exercise as tolerated. - POCT HgB A1C; Future - POCT Glucose (CBG); Future - metFORMIN (GLUCOPHAGE) 1000 MG tablet; Take 1.5 tablets (1,500 mg  total) by mouth daily with breakfast.  Dispense: 270 tablet; Refill: 0 - glipiZIDE (GLUCOTROL) 10 MG tablet; Take 2 tablets (20 mg total) by mouth daily before breakfast.  Dispense: 180 tablet; Refill: 1 - metFORMIN (GLUCOPHAGE) 1000 MG tablet; Take 1.5 tablets (1,500 mg total) by mouth daily with breakfast.  Dispense: 270 tablet; Refill: 0 - POCT Glucose (CBG) - POCT HgB A1C  2. Essential hypertension - Her blood pressure is improving, not at goal which is less than 130/80. She will continue current medication, DASH diet and exercise as tolerated. - amLODipine (NORVASC) 5 MG tablet; Take 1 tablet (5 mg total) by mouth daily.  Dispense: 90 tablet; Refill: 1 - lisinopril (ZESTRIL) 40 MG tablet; Take 1 tablet (40 mg total) by mouth daily in the morning.  Dispense: 90 tablet; Refill: 1 - metoprolol succinate (TOPROL-XL) 100 MG 24 hr tablet; Take 2 tablets (200 mg total) by mouth daily. Take with or immediately following a meal.  Dispense: 180 tablet; Refill: 1  3. Hyperlipidemia, unspecified hyperlipidemia type - She will continue current medication, low fat/cholesterol diet and exercise as tolerated. - atorvastatin (LIPITOR) 40 MG tablet; Take 2 tablets (80 mg total) by mouth daily.  Dispense: 90 tablet; Refill: 1  4. 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 - clopidogrel (PLAVIX) 75 MG tablet; Take 1 tablet (75 mg total) by mouth daily.  Dispense:  90 tablet; Refill: 1 - isosorbide mononitrate (IMDUR) 30 MG 24 hr tablet; Take 1 tablet (30 mg total) by mouth daily.  Dispense: 90 tablet; Refill: 1  5. Gastroesophageal reflux disease without esophagitis - Her acid reflux is under control with taking medication.  -Avoid spicy, fatty and fried food -Avoid sodas and sour juices -Avoid heavy meals -Avoid eating 4 hours before bedtime -Elevate head of bed at night - famotidine (PEPCID) 20 MG tablet; Take 1 tablet (20 mg total) by mouth 2 (two) times daily as needed for heartburn or indigestion.  Dispense: 180 tablet; Refill: 1    Return in about 13 weeks (around 06/12/2023), or if symptoms worsen or fail to improve.    Jorje Vanatta Trellis Paganini, NP

## 2023-03-13 NOTE — Patient Instructions (Signed)
DASH Eating Plan DASH stands for Dietary Approaches to Stop Hypertension. The DASH eating plan is a healthy eating plan that has been shown to: Reduce high blood pressure (hypertension). Reduce your risk for type 2 diabetes, heart disease, and stroke. Help with weight loss. What are tips for following this plan? Reading food labels Check food labels for the amount of salt (sodium) per serving. Choose foods with less than 5 percent of the Daily Value of sodium. Generally, foods with less than 300 milligrams (mg) of sodium per serving fit into this eating plan. To find whole grains, look for the word "whole" as the first word in the ingredient list. Shopping Buy products labeled as "low-sodium" or "no salt added." Buy fresh foods. Avoid canned foods and pre-made or frozen meals. Cooking Avoid adding salt when cooking. Use salt-free seasonings or herbs instead of table salt or sea salt. Check with your health care provider or pharmacist before using salt substitutes. Do not fry foods. Cook foods using healthy methods such as baking, boiling, grilling, roasting, and broiling instead. Cook with heart-healthy oils, such as olive, canola, avocado, soybean, or sunflower oil. Meal planning  Eat a balanced diet that includes: 4 or more servings of fruits and 4 or more servings of vegetables each day. Try to fill one-half of your plate with fruits and vegetables. 6-8 servings of whole grains each day. Less than 6 oz (170 g) of lean meat, poultry, or fish each day. A 3-oz (85-g) serving of meat is about the same size as a deck of cards. One egg equals 1 oz (28 g). 2-3 servings of low-fat dairy each day. One serving is 1 cup (237 mL). 1 serving of nuts, seeds, or beans 5 times each week. 2-3 servings of heart-healthy fats. Healthy fats called omega-3 fatty acids are found in foods such as walnuts, flaxseeds, fortified milks, and eggs. These fats are also found in cold-water fish, such as sardines, salmon,  and mackerel. Limit how much you eat of: Canned or prepackaged foods. Food that is high in trans fat, such as some fried foods. Food that is high in saturated fat, such as fatty meat. Desserts and other sweets, sugary drinks, and other foods with added sugar. Full-fat dairy products. Do not salt foods before eating. Do not eat more than 4 egg yolks a week. Try to eat at least 2 vegetarian meals a week. Eat more home-cooked food and less restaurant, buffet, and fast food. Lifestyle When eating at a restaurant, ask that your food be prepared with less salt or no salt, if possible. If you drink alcohol: Limit how much you use to: 0-1 drink a day for women who are not pregnant. 0-2 drinks a day for men. Be aware of how much alcohol is in your drink. In the [place] one drink equals one 12 oz bottle of beer (355 mL), one 5 oz glass of wine (148 mL), or one 1 oz glass of hard liquor (44 mL). General information Avoid eating more than 2,300 mg of salt a day. If you have hypertension, you may need to reduce your sodium intake to 1,500 mg a day. Work with your health care provider to maintain a healthy body weight or to lose weight. Ask what an ideal weight is for you. Get at least 30 minutes of exercise that causes your heart to beat faster (aerobic exercise) most days of the week. Activities may include walking, swimming, or biking. Work with your health care provider or dietitian to   adjust your eating plan to your individual calorie needs. What foods should I eat? Fruits All fresh, dried, or frozen fruit. Canned fruit in natural juice (without added sugar). Vegetables Fresh or frozen vegetables (raw, steamed, roasted, or grilled). Low-sodium or reduced-sodium tomato and vegetable juice. Low-sodium or reduced-sodium tomato sauce and tomato paste. Low-sodium or reduced-sodium canned vegetables. Grains Whole-grain or whole-wheat bread. Whole-grain or whole-wheat pasta. Brown rice. Oatmeal. Quinoa.  Bulgur. Whole-grain and low-sodium cereals. Pita bread. Low-fat, low-sodium crackers. Whole-wheat flour tortillas. Meats and other proteins Skinless chicken or turkey. Ground chicken or turkey. Pork with fat trimmed off. Fish and seafood. Egg whites. Dried beans, peas, or lentils. Unsalted nuts, nut butters, and seeds. Unsalted canned beans. Lean cuts of beef with fat trimmed off. Low-sodium, lean precooked or cured meat, such as sausages or meat loaves. Dairy Low-fat (1%) or fat-free (skim) milk. Reduced-fat, low-fat, or fat-free cheeses. Nonfat, low-sodium ricotta or cottage cheese. Low-fat or nonfat yogurt. Low-fat, low-sodium cheese. Fats and oils Soft margarine without trans fats. Vegetable oil. Reduced-fat, low-fat, or light mayonnaise and salad dressings (reduced-sodium). Canola, safflower, olive, avocado, soybean, and sunflower oils. Avocado. Seasonings and condiments Herbs. Spices. Seasoning mixes without salt. Other foods Unsalted popcorn and pretzels. Fat-free sweets. The items listed above may not be a complete list of foods and beverages you can eat. Contact a dietitian for more information. What foods should I avoid? Fruits Canned fruit in a light or heavy syrup. Fried fruit. Fruit in cream or butter sauce. Vegetables Creamed or fried vegetables. Vegetables in a cheese sauce. Regular canned vegetables (not low-sodium or reduced-sodium). Regular canned tomato sauce and paste (not low-sodium or reduced-sodium). Regular tomato and vegetable juice (not low-sodium or reduced-sodium). Pickles. Olives. Grains Baked goods made with fat, such as croissants, muffins, or some breads. Dry pasta or rice meal packs. Meats and other proteins Fatty cuts of meat. Ribs. Fried meat. Bacon. Bologna, salami, and other precooked or cured meats, such as sausages or meat loaves. Fat from the back of a pig (fatback). Bratwurst. Salted nuts and seeds. Canned beans with added salt. Canned or smoked fish.  Whole eggs or egg yolks. Chicken or turkey with skin. Dairy Whole or 2% milk, cream, and half-and-half. Whole or full-fat cream cheese. Whole-fat or sweetened yogurt. Full-fat cheese. Nondairy creamers. Whipped toppings. Processed cheese and cheese spreads. Fats and oils Butter. Stick margarine. Lard. Shortening. Ghee. Bacon fat. Tropical oils, such as coconut, palm kernel, or palm oil. Seasonings and condiments Onion salt, garlic salt, seasoned salt, table salt, and sea salt. Worcestershire sauce. Tartar sauce. Barbecue sauce. Teriyaki sauce. Soy sauce, including reduced-sodium. Steak sauce. Canned and packaged gravies. Fish sauce. Oyster sauce. Cocktail sauce. Store-bought horseradish. Ketchup. Mustard. Meat flavorings and tenderizers. Bouillon cubes. Hot sauces. Pre-made or packaged marinades. Pre-made or packaged taco seasonings. Relishes. Regular salad dressings. Other foods Salted popcorn and pretzels. The items listed above may not be a complete list of foods and beverages you should avoid. Contact a dietitian for more information. Where to find more information National Heart, Lung, and Blood Institute: www.nhlbi.nih.gov American Heart Association: www.heart.org Academy of Nutrition and Dietetics: www.eatright.org National Kidney Foundation: www.kidney.org Summary The DASH eating plan is a healthy eating plan that has been shown to reduce high blood pressure (hypertension). It may also reduce your risk for type 2 diabetes, heart disease, and stroke. When on the DASH eating plan, aim to eat more fresh fruits and vegetables, whole grains, lean proteins, low-fat dairy, and heart-healthy fats. With the DASH   eating plan, you should limit salt (sodium) intake to 2,300 mg a day. If you have hypertension, you may need to reduce your sodium intake to 1,500 mg a day. Work with your health care provider or dietitian to adjust your eating plan to your individual calorie needs. This information is not  intended to replace advice given to you by your health care provider. Make sure you discuss any questions you have with your health care provider. Document Revised: 09/17/2019 Document Reviewed: 09/17/2019 Elsevier Patient Education  2023 [employer] Carbohydrate Counting for Diabetes Mellitus, Adult Carbohydrate counting is a method of keeping track of how many carbohydrates you eat. Eating carbohydrates increases the amount of sugar (glucose) in the blood. Counting how many carbohydrates you eat improves how well you manage your blood glucose. This, in turn, helps you manage your diabetes. Carbohydrates are measured in grams (g) per serving. It is important to know how many carbohydrates (in grams or by serving size) you can have in each meal. This is different for every person. A dietitian can help you make a meal plan and calculate how many carbohydrates you should have at each meal and snack. What foods contain carbohydrates? Carbohydrates are found in the following foods: Grains, such as breads and cereals. Dried beans and soy products. Starchy vegetables, such as potatoes, peas, and corn. Fruit and fruit juices. Milk and yogurt. Sweets and snack foods, such as cake, cookies, candy, chips, and soft drinks. How do I count carbohydrates in foods? There are two ways to count carbohydrates in food. You can read food labels or learn standard serving sizes of foods. You can use either of these methods or a combination of both. Using the Nutrition Facts label The Nutrition Facts list is included on the labels of almost all packaged foods and beverages in the [place]. It includes: The serving size. Information about nutrients in each serving, including the grams of carbohydrate per serving. To use the Nutrition Facts, decide how many servings you will have. Then, multiply the number of servings by the number of carbohydrates per serving. The resulting number is the total grams of  carbohydrates that you will be having. Learning the standard serving sizes of foods When you eat carbohydrate foods that are not packaged or do not include Nutrition Facts on the label, you need to measure the servings in order to count the grams of carbohydrates. Measure the foods that you will eat with a food scale or measuring cup, if needed. Decide how many standard-size servings you will eat. Multiply the number of servings by 15. For foods that contain carbohydrates, one serving equals 15 g of carbohydrates. For example, if you eat 2 cups or 10 oz (300 g) of strawberries, you will have eaten 2 servings and 30 g of carbohydrates (2 servings x 15 g = 30 g). For foods that have more than one food mixed, such as soups and casseroles, you must count the carbohydrates in each food that is included. The following list contains standard serving sizes of common carbohydrate-rich foods. Each of these servings has about 15 g of carbohydrates: 1 slice of bread. 1 six-inch (15 cm) tortilla. ? cup or 2 oz (53 g) cooked rice or pasta.  cup or 3 oz (85 g) cooked or canned, drained and rinsed beans or lentils.  cup or 3 oz (85 g) starchy vegetable, such as peas, corn, or squash.  cup or 4 oz (120 g) hot cereal.  cup or 3 oz (85   g) boiled or mashed potatoes, or  or 3 oz (85 g) of a large baked potato.  cup or 4 fl oz (118 mL) fruit juice. 1 cup or 8 fl oz (237 mL) milk. 1 small or 4 oz (106 g) apple.  or 2 oz (63 g) of a medium banana. 1 cup or 5 oz (150 g) strawberries. 3 cups or 1 oz (28.3 g) popped popcorn. What is an example of carbohydrate counting? To calculate the grams of carbohydrates in this sample meal, follow the steps shown below. Sample meal 3 oz (85 g) chicken breast. ? cup or 4 oz (106 g) brown rice.  cup or 3 oz (85 g) corn. 1 cup or 8 fl oz (237 mL) milk. 1 cup or 5 oz (150 g) strawberries with sugar-free whipped topping. Carbohydrate calculation Identify the foods that  contain carbohydrates: Rice. Corn. Milk. Strawberries. Calculate how many servings you have of each food: 2 servings rice. 1 serving corn. 1 serving milk. 1 serving strawberries. Multiply each number of servings by 15 g: 2 servings rice x 15 g = 30 g. 1 serving corn x 15 g = 15 g. 1 serving milk x 15 g = 15 g. 1 serving strawberries x 15 g = 15 g. Add together all of the amounts to find the total grams of carbohydrates eaten: 30 g + 15 g + 15 g + 15 g = 75 g of carbohydrates total. What are tips for following this plan? Shopping Develop a meal plan and then make a shopping list. Buy fresh and frozen vegetables, fresh and frozen fruit, dairy, eggs, beans, lentils, and whole grains. Look at food labels. Choose foods that have more fiber and less sugar. Avoid processed foods and foods with added sugars. Meal planning Aim to have the same number of grams of carbohydrates at each meal and for each snack time. Plan to have regular, balanced meals and snacks. Where to find more information American Diabetes Association: diabetes.org Centers for Disease Control and Prevention: cdc.gov Academy of Nutrition and Dietetics: eatright.org Association of Diabetes Care & Education Specialists: diabeteseducator.org Summary Carbohydrate counting is a method of keeping track of how many carbohydrates you eat. Eating carbohydrates increases the amount of sugar (glucose) in your blood. Counting how many carbohydrates you eat improves how well you manage your blood glucose. This helps you manage your diabetes. A dietitian can help you make a meal plan and calculate how many carbohydrates you should have at each meal and snack. This information is not intended to replace advice given to you by your health care provider. Make sure you discuss any questions you have with your health care provider. Document Revised: 05/17/2020 Document Reviewed: 05/17/2020 Elsevier Patient Education  2023 [employer]  

## 2023-03-14 ENCOUNTER — Other Ambulatory Visit: Payer: Self-pay

## 2023-03-14 ENCOUNTER — Ambulatory Visit: Payer: Self-pay | Attending: Internal Medicine | Admitting: Internal Medicine

## 2023-03-14 NOTE — Progress Notes (Deleted)
Cardiology Office Note:    Date:  03/14/2023   ID:  TEKEYLA BARTON, DOB May 30, 1961, MRN [id number]  PCP:  Rolm Gala, NP   [facility] Providers Cardiologist:  Julien Nordmann, MD     Referring MD: No ref. provider found   CC: HCM f/u  History of Present Illness:    Laurie Kirby is a 62 y.o. female with a hx of CAD, HT, and DM. On Stress CMR she was found to have septal thickness of 21 mm and LGE ~ 5%.LVOT Gradient not well interrogated by ~ 20 mm Hg. Septal hypertrophy is greatest.  There was no aneurysm.  There was no LVOT gradient on CMR. 2023:  No symptoms.  Still drinking and smoking.  Rare PVCs without exercise induced ectopy.    Past Medical History:  Diagnosis Date   CAD (coronary artery disease)    Diabetes mellitus without complication (HCC)    Hyperlipidemia    Hypertension    Hypertrophic cardiomyopathy (HCC)     Past Surgical History:  Procedure Laterality Date   CORONARY ANGIOPLASTY WITH STENT PLACEMENT      Current Medications: No outpatient medications have been marked as taking for the 03/14/23 encounter (Appointment) with Christell Constant, MD.     Allergies:   Patient has no known allergies.   Social History   Socioeconomic History   Marital status: Single    Spouse name: Not on file   Number of children: 3   Years of education: Not on file   Highest education level: High school graduate  Occupational History   Occupation: unemployed  Tobacco Use   Smoking status: Some Days    Packs/day: .25    Types: Cigarettes   Smokeless tobacco: Never   Tobacco comments:    quit following having teeth removed - pt reports she smokes when she drinks (edited: 08/09/2020=2)    08/22/22 pt reports approximately 4 cigarettes on the weekend  Vaping Use   Vaping Use: Never used  Substance and Sexual Activity   Alcohol use: Yes    Alcohol/week: 1.0 standard drink of alcohol    Types: 1 Glasses of wine per week    Comment:   wine on the weekends   Drug use: No   Sexual activity: Yes  Other Topics Concern   Not on file  Social History Narrative   A friend supports. Not on food stamps. Pretty well taken care of by friend.    Social Determinants of Health   Financial Resource Strain: Low Risk  (03/31/2018)   Overall Financial Resource Strain (CARDIA)    Difficulty of Paying Living Expenses: Not very hard  Food Insecurity: No Food Insecurity (08/22/2022)   Hunger Vital Sign    Worried About Running Out of Food in the Last Year: Never true    Ran Out of Food in the Last Year: Never true  Transportation Needs: No Transportation Needs (08/22/2022)   PRAPARE - Administrator, Civil Service (Medical): No    Lack of Transportation (Non-Medical): No  Physical Activity: Unknown (03/31/2018)   Exercise Vital Sign    Days of Exercise per Week: 0 days    Minutes of Exercise per Session: Not on file  Stress: No Stress Concern Present (03/31/2018)   [employer] of Occupational Health - Occupational Stress Questionnaire    Feeling of Stress : Not at all  Social Connections: Socially Integrated (03/31/2018)   Social Connection and Isolation Panel [NHANES]  Frequency of Communication with Friends and Family: More than three times a week    Frequency of Social Gatherings with Friends and Family: Once a week    Attends Religious Services: More than 4 times per year    Active Member of [employer] or Organizations: Yes    Attends Engineer, structural: More than 4 times per year    Marital Status: Living with partner    Family History: The patient's family history includes Breast cancer in her cousin; Cancer in her maternal aunt; Diabetes type II in her father and mother; Heart disease in her father.  ROS:   Please see the history of present illness.     All other systems reviewed and are negative.  EKGs/Labs/Other Studies Reviewed:    The following studies were reviewed today:  Cardiac Studies &  Procedures     STRESS TESTS  EXERCISE TOLERANCE TEST (ETT) 09/18/2022  Narrative   Nonspecific ST abnormality in the inferolateral leasds was present on baseline EKG.   A Bruce protocol stress test was performed. Exercise capacity was severely impaired. Patient exercised for 3 min and 0 sec. Maximum HR of 80 bpm. MPHR 50.0 %. Peak METS 4.6 . The patient experienced no angina during the test. The patient reported muscle fatigue during the stress test. Normal blood pressure and blunted heart rate response noted during stress. Heart rate recovery was normal.   There was slight downsloping of the ST segments in the inferolateral leads at peak exercise. Arrhythmias during stress: occasional PVCs. The ECG was not diagnostic due to failure to achieve 85% MAPHR.   Could not assess for ischemic due to submaximal heart rate.   Occasional PVCs were noted during stress but no high grade ventricular arrhythmias were present.   ECHOCARDIOGRAM  ECHOCARDIOGRAM COMPLETE 03/23/2022  Narrative ECHOCARDIOGRAM REPORT    Patient Name:   Laurie Kirby Date of Exam: 03/23/2022 Medical Rec #:  [id number]       Height:       63.0 in Accession #:    [id number]      Weight:       158.3 lb Date of Birth:  Jun 01, 1961       BSA:          1.751 m Patient Age:    60 years        BP:           189/88 mmHg Patient Gender: F               HR:           106 bpm. Exam Location:  [facility]  Procedure: 2D Echo  Indications:     NSTEMI I21.4  History:         Patient has no prior history of Echocardiogram examinations.  Sonographer:     Overton Mam RDCS Referring Phys:  [id number] Eliezer Mccoy PATEL Diagnosing Phys: Chilton Si MD  IMPRESSIONS   1. Severe septal hypertrophy measuring 2.2 cm. Otherwise moderate concentric LVH. Findings consistent with hypertrophic obstructive cardiomyopathy. Apical gradient with peak velocity 3.52 m/s at rest. Peak gradient 49.4 mmHg. Left ventricular ejection fraction, by estimation,  is 70 to 75%. The left ventricle has hyperdynamic function. The left ventricle has no regional wall motion abnormalities. There is severe left ventricular hypertrophy. Left ventricular diastolic parameters are consistent with Grade I diastolic dysfunction (impaired relaxation). Elevated left ventricular end-diastolic pressure. 2. Right ventricular systolic function is normal. The right ventricular size is normal. 3.  The mitral valve is normal in structure. No evidence of mitral valve regurgitation. No evidence of mitral stenosis. 4. The aortic valve is normal in structure. Aortic valve regurgitation is not visualized. No aortic stenosis is present.  FINDINGS Left Ventricle: Severe septal hypertrophy measuring 2.2 cm. Otherwise moderate concentric LVH. Findings consistent with hypertrophic obstructive cardiomyopathy. Apical gradient with peak velocity 3.52 m/s at rest. Peak gradient 49.4 mmHg. Left ventricular ejection fraction, by estimation, is 70 to 75%. The left ventricle has hyperdynamic function. The left ventricle has no regional wall motion abnormalities. The left ventricular internal cavity size was normal in size. There is severe left ventricular hypertrophy. Left ventricular diastolic parameters are consistent with Grade I diastolic dysfunction (impaired relaxation). Elevated left ventricular end-diastolic pressure.  Right Ventricle: The right ventricular size is normal. No increase in right ventricular wall thickness. Right ventricular systolic function is normal.  Left Atrium: Left atrial size was normal in size.  Right Atrium: Right atrial size was normal in size.  Pericardium: There is no evidence of pericardial effusion.  Mitral Valve: The mitral valve is normal in structure. No evidence of mitral valve regurgitation. No evidence of mitral valve stenosis.  Tricuspid Valve: The tricuspid valve is normal in structure. Tricuspid valve regurgitation is trivial. No evidence of  tricuspid stenosis.  Aortic Valve: The aortic valve is normal in structure. Aortic valve regurgitation is not visualized. No aortic stenosis is present. Aortic valve mean gradient measures 8.0 mmHg. Aortic valve peak gradient measures 16.2 mmHg. Aortic valve area, by VTI measures 3.78 cm.  Pulmonic Valve: The pulmonic valve was normal in structure. Pulmonic valve regurgitation is trivial. No evidence of pulmonic stenosis.  Aorta: The aortic root is normal in size and structure.  Venous: The inferior vena cava was not well visualized.  IAS/Shunts: No atrial level shunt detected by color flow Doppler.   LEFT VENTRICLE PLAX 2D LVIDd:         3.90 cm     Diastology LVIDs:         2.60 cm     LV e' medial:    4.35 cm/s LV PW:         1.50 cm     LV E/e' medial:  18.2 LV IVS:        2.20 cm     LV e' lateral:   4.46 cm/s LVOT diam:     2.20 cm     LV E/e' lateral: 17.8 LV SV:         116 LV SV Index:   66 LVOT Area:     3.80 cm  LV Volumes (MOD) LV vol d, MOD A2C: 50.1 ml LV vol d, MOD A4C: 30.2 ml LV vol s, MOD A2C: 17.7 ml LV vol s, MOD A4C: 12.0 ml LV SV MOD A2C:     32.4 ml LV SV MOD A4C:     30.2 ml LV SV MOD BP:      26.0 ml  RIGHT VENTRICLE RV Basal diam:  2.60 cm RV S prime:     22.20 cm/s  LEFT ATRIUM             Index        RIGHT ATRIUM          Index LA diam:        3.30 cm 1.88 cm/m   RA Area:     9.34 cm LA Vol (A2C):   56.1 ml 32.04 ml/m  RA Volume:  20.20 ml 11.54 ml/m LA Vol (A4C):   33.8 ml 19.31 ml/m LA Biplane Vol: 44.3 ml 25.30 ml/m AORTIC VALVE                     PULMONIC VALVE AV Area (Vmax):    3.37 cm      PV Vmax:       0.91 m/s AV Area (Vmean):   3.47 cm      PV Peak grad:  3.3 mmHg AV Area (VTI):     3.78 cm AV Vmax:           201.00 cm/s AV Vmean:          127.000 cm/s AV VTI:            0.306 m AV Peak Grad:      16.2 mmHg AV Mean Grad:      8.0 mmHg LVOT Vmax:         178.00 cm/s LVOT Vmean:        116.000 cm/s LVOT VTI:           0.304 m LVOT/AV VTI ratio: 0.99  AORTA Ao Root diam: 3.20 cm Ao Asc diam:  3.00 cm  MITRAL VALVE                TRICUSPID VALVE MV Area (PHT): 4.46 cm     TR Peak grad:   18.1 mmHg MV Decel Time: 170 msec     TR Vmax:        213.00 cm/s MV E velocity: 79.30 cm/s MV A velocity: 109.00 cm/s  SHUNTS MV E/A ratio:  0.73         Systemic VTI:  0.30 m Systemic Diam: 2.20 cm  Chilton Si MD Electronically signed by Chilton Si MD Signature Date/Time: 03/23/2022/2:29:10 PM    Final    MONITORS  LONG TERM MONITOR (3-14 DAYS) 10/17/2022  Narrative   Patient had a minimum heart rate of 42 bpm, maximum heart rate of 103 bpm, and average heart rate of 66 bpm.   Predominant underlying rhythm was sinus rhythm.   Isolated PACs were rare (<1.0%).   Isolated PVCs were occasional (3.7%).   Triggered and diary events associated with sinus rhythm.  Occasional, asymptomatic, PVCs.            Recent Labs: 03/23/2022: ALT 19 05/16/2022: BUN 21; Creatinine, Ser 0.82; Hemoglobin 11.4; Platelets 287; Potassium 4.2; Sodium 143  Recent Lipid Panel    Component Value Date/Time   CHOL 129 03/23/2022 0535   CHOL 238 (H) 11/01/2021 1820   TRIG 51 03/23/2022 0535   HDL 50 03/23/2022 0535   HDL 60 11/01/2021 1820   CHOLHDL 2.6 03/23/2022 0535   VLDL 10 03/23/2022 0535   LDLCALC 69 03/23/2022 0535   LDLCALC 150 (H) 11/01/2021 1820    Physical Exam:    VS:  There were no vitals taken for this visit.    Wt Readings from Last 3 Encounters:  03/13/23 156 lb 11.2 oz (71.1 kg)  12/12/22 159 lb (72.1 kg)  09/16/22 162 lb (73.5 kg)     GEN:  Well nourished, well developed in no acute distress HEENT: Normal NECK: No JVD; No carotid bruits LYMPHATICS: No lymphadenopathy CARDIAC: RRR, systolic murmur, no rubs, gallops RESPIRATORY:  Clear to auscultation without rales, wheezing or rhonchi  ABDOMEN: Soft, non-tender, non-distended MUSCULOSKELETAL:  No edema; No deformity  SKIN:  Warm and dry NEUROLOGIC:  Alert and oriented x 3  PSYCHIATRIC:  Normal affect   ASSESSMENT:    No diagnosis found.  PLAN:    Hypertrophic Cardiomyopathy - septal Variant; non obstructive - with systolic murmur - NYHA I - no family history, she has four sisters and three children - she has little insight to her disease process: we discussed at length and conferenced her sister in - will do echo based screening for now - reaching out to our staff: we need her heart monitor results she send in for June   SCD evaluation  ***   Time Spent Directly with Patient:   I have spent a total of 40 minutes with the patient reviewing notes, imaging, EKGs, labs and examining the patient as well as establishing an assessment and plan that was discussed personally with the patient.  > 50% of time was spent in direct patient care and family and reviewing imaging with patient.      Medication Adjustments/Labs and Tests Ordered: Current medicines are reviewed at length with the patient today.  Concerns regarding medicines are outlined above.  No orders of the defined types were placed in this encounter.  No orders of the defined types were placed in this encounter.   There are no Patient Instructions on file for this visit.   Signed, Christell Constant, MD  03/14/2023 8:11 AM    [facility]

## 2023-03-17 ENCOUNTER — Other Ambulatory Visit: Payer: Self-pay

## 2023-03-17 ENCOUNTER — Encounter: Payer: Self-pay | Admitting: Internal Medicine

## 2023-05-08 ENCOUNTER — Other Ambulatory Visit: Payer: Self-pay

## 2023-06-12 ENCOUNTER — Other Ambulatory Visit: Payer: Self-pay

## 2023-06-12 ENCOUNTER — Ambulatory Visit: Payer: 59 | Admitting: Gerontology

## 2023-06-12 VITALS — BP 136/74 | HR 65 | Temp 98.2°F | Ht 64.5 in | Wt 152.6 lb

## 2023-06-12 DIAGNOSIS — E785 Hyperlipidemia, unspecified: Secondary | ICD-10-CM

## 2023-06-12 DIAGNOSIS — E782 Mixed hyperlipidemia: Secondary | ICD-10-CM

## 2023-06-12 DIAGNOSIS — Z0000 Encounter for general adult medical examination without abnormal findings: Secondary | ICD-10-CM

## 2023-06-12 DIAGNOSIS — E119 Type 2 diabetes mellitus without complications: Secondary | ICD-10-CM

## 2023-06-12 LAB — POCT GLYCOSYLATED HEMOGLOBIN (HGB A1C): Hemoglobin A1C: 8.2 % — AB (ref 4.0–5.6)

## 2023-06-12 LAB — GLUCOSE, POCT (MANUAL RESULT ENTRY): POC Glucose: 139 mg/dl — AB (ref 70–99)

## 2023-06-12 MED ORDER — ATORVASTATIN CALCIUM 40 MG PO TABS
80.0000 mg | ORAL_TABLET | Freq: Every day | ORAL | 1 refills | Status: DC
Start: 2023-06-12 — End: 2023-09-11
  Filled 2023-06-12 – 2023-07-08 (×2): qty 90, 45d supply, fill #0

## 2023-06-12 MED ORDER — METFORMIN HCL 1000 MG PO TABS
1500.0000 mg | ORAL_TABLET | Freq: Every day | ORAL | 0 refills | Status: DC
Start: 2023-06-12 — End: 2023-09-11
  Filled 2023-06-12: qty 270, 180d supply, fill #0

## 2023-06-12 NOTE — Progress Notes (Signed)
Established Patient Office Visit  Subjective   Patient ID: Laurie Kirby, female    DOB: 1961-01-03  Age: 62 y.o. MRN: [id number]  Chief Complaint  Patient presents with   Follow-up    HPI  Laurie Kirby is a 62 y/o female who has history of Type 2 diabetes mellitus, Hyperlipidemia, Hypertension presents for routine follow visit and medication refill . She states that she's compliant with her medications, denies side effetcs and continues to make healthy lifestyle changes. Her HgbA1c checked during visit increased from 7.6% to 8.2%  and blood glucose was 139 mg/dl. She denies hypo/hyperglycemic symptoms, peripheral neuropathy and performs daily foot checks. Overall, she states that she's doing well and offers no further complaint.  Review of Systems  Constitutional: Negative.   Eyes: Negative.   Respiratory: Negative.    Cardiovascular: Negative.   Genitourinary: Negative.   Neurological: Negative.   Endo/Heme/Allergies: Negative.   Psychiatric/Behavioral: Negative.        Objective:     BP 136/74 (BP Location: Right Arm, Patient Position: Sitting, Cuff Size: Large)   Pulse 65   Temp 98.2 F (36.8 C)   Ht 5' 4.5" (1.638 m)   Wt 152 lb 9.6 oz (69.2 kg)   BMI 25.79 kg/m  BP Readings from Last 3 Encounters:  06/12/23 136/74  03/13/23 (!) 147/68  12/12/22 (!) 103/55   Wt Readings from Last 3 Encounters:  06/12/23 152 lb 9.6 oz (69.2 kg)  03/13/23 156 lb 11.2 oz (71.1 kg)  12/12/22 159 lb (72.1 kg)      Physical Exam HENT:     Head: Normocephalic and atraumatic.     Mouth/Throat:     Mouth: Mucous membranes are moist.  Eyes:     Pupils: Pupils are equal, round, and reactive to light.  Cardiovascular:     Rate and Rhythm: Normal rate and regular rhythm.     Pulses: Normal pulses.     Heart sounds: Normal heart sounds.  Pulmonary:     Effort: Pulmonary effort is normal.     Breath sounds: Normal breath sounds.  Skin:    General: Skin is warm.   Neurological:     General: No focal deficit present.     Mental Status: She is alert and oriented to person, place, and time. Mental status is at baseline.  Psychiatric:        Mood and Affect: Mood normal.        Behavior: Behavior normal.        Thought Content: Thought content normal.        Judgment: Judgment normal.      Results for orders placed or performed in visit on 06/12/23  Lipid panel  Result Value Ref Range   Cholesterol, Total 166 100 - 199 mg/dL   Triglycerides 756 0 - 149 mg/dL   HDL 50 >43 mg/dL   VLDL Cholesterol Cal 21 5 - 40 mg/dL   LDL Chol Calc (NIH) 95 0 - 99 mg/dL   Chol/HDL Ratio 3.3 0.0 - 4.4 ratio  HgB A1c  Result Value Ref Range   Hgb A1c MFr Bld 7.6 (H) 4.8 - 5.6 %   Est. average glucose Bld gHb Est-mCnc 171 mg/dL  CBC w/Diff  Result Value Ref Range   WBC 15.3 (H) 3.4 - 10.8 x10E3/uL   RBC 4.57 3.77 - 5.28 x10E6/uL   Hemoglobin 13.3 11.1 - 15.9 g/dL   Hematocrit 32.9 51.8 - 46.6 %   MCV 90  79 - 97 fL   MCH 29.1 26.6 - 33.0 pg   MCHC 32.3 31.5 - 35.7 g/dL   RDW 25.9 56.3 - 87.5 %   Platelets 287 150 - 450 x10E3/uL   Neutrophils 62 Not Estab. %   Lymphs 33 Not Estab. %   Monocytes 4 Not Estab. %   Eos 1 Not Estab. %   Basos 0 Not Estab. %   Neutrophils Absolute 9.3 (H) 1.4 - 7.0 x10E3/uL   Lymphocytes Absolute 5.0 (H) 0.7 - 3.1 x10E3/uL   Monocytes Absolute 0.7 0.1 - 0.9 x10E3/uL   EOS (ABSOLUTE) 0.2 0.0 - 0.4 x10E3/uL   Basophils Absolute 0.1 0.0 - 0.2 x10E3/uL   Immature Granulocytes 0 Not Estab. %   Immature Grans (Abs) 0.0 0.0 - 0.1 x10E3/uL  Comp Met (CMET)  Result Value Ref Range   Glucose 154 (H) 70 - 99 mg/dL   BUN 17 8 - 27 mg/dL   Creatinine, Ser 6.43 0.57 - 1.00 mg/dL   eGFR 73 >32 RJ/JOA/4.16   BUN/Creatinine Ratio 19 12 - 28   Sodium 144 134 - 144 mmol/L   Potassium 4.3 3.5 - 5.2 mmol/L   Chloride 107 (H) 96 - 106 mmol/L   CO2 24 20 - 29 mmol/L   Calcium 9.7 8.7 - 10.3 mg/dL   Total Protein 6.8 6.0 - 8.5 g/dL    Albumin 4.2 3.9 - 4.9 g/dL   Globulin, Total 2.6 1.5 - 4.5 g/dL   Bilirubin Total 0.3 0.0 - 1.2 mg/dL   Alkaline Phosphatase 57 44 - 121 IU/L   AST 14 0 - 40 IU/L   ALT 14 0 - 32 IU/L  POCT Glucose (CBG)  Result Value Ref Range   POC Glucose 139 (A) 70 - 99 mg/dl  POCT HgB S0Y  Result Value Ref Range   Hemoglobin A1C 8.2 (A) 4.0 - 5.6 %   HbA1c POC (<> result, manual entry)     HbA1c, POC (prediabetic range)     HbA1c, POC (controlled diabetic range)      Last CBC Lab Results  Component Value Date   WBC 15.3 (H) 06/12/2023   HGB 13.3 06/12/2023   HCT 41.2 06/12/2023   MCV 90 06/12/2023   MCH 29.1 06/12/2023   RDW 12.5 06/12/2023   PLT 287 06/12/2023   Last metabolic panel Lab Results  Component Value Date   GLUCOSE 154 (H) 06/12/2023   NA 144 06/12/2023   K 4.3 06/12/2023   CL 107 (H) 06/12/2023   CO2 24 06/12/2023   BUN 17 06/12/2023   CREATININE 0.90 06/12/2023   EGFR 73 06/12/2023   CALCIUM 9.7 06/12/2023   PROT 6.8 06/12/2023   ALBUMIN 4.2 06/12/2023   LABGLOB 2.6 06/12/2023   AGRATIO 1.4 08/02/2021   BILITOT 0.3 06/12/2023   ALKPHOS 57 06/12/2023   AST 14 06/12/2023   ALT 14 06/12/2023   ANIONGAP 10 03/22/2022   Last lipids Lab Results  Component Value Date   CHOL 166 06/12/2023   HDL 50 06/12/2023   LDLCALC 95 06/12/2023   TRIG 115 06/12/2023   CHOLHDL 3.3 06/12/2023   Last hemoglobin A1c Lab Results  Component Value Date   HGBA1C 8.2 (A) 06/12/2023   Last thyroid functions Lab Results  Component Value Date   TSH 1.720 01/11/2021   Last vitamin D No results found for: "25OHVITD2", "25OHVITD3", "VD25OH"    The ASCVD Risk score (Arnett DK, et al., 2019) failed to calculate for the following  reasons:   The patient has a prior MI or stroke diagnosis    Assessment & Plan:   1. Type 2 diabetes mellitus without complication, without long-term current use of insulin (HCC) -Her HgbA1c was 8.2%, and her goal should be less than 7%. She will  continue current medication, low carb/non concentrated sweet diet and exercise as tolerated. - POCT HgB A1C; Future - POCT Glucose (CBG); Future - HgB A1c; Future - metFORMIN (GLUCOPHAGE) 1000 MG tablet; Take 1.5 tablets (1,500 mg total) by mouth daily with breakfast.  Dispense: 270 tablet; Refill: 0 - HgB A1c - POCT Glucose (CBG) - POCT HgB A1C  2. Mixed hyperlipidemia - She will continue current medication, low fat/non cholesterol diet and exercise as tolerated. - Lipid panel; Future - atorvastatin (LIPITOR) 40 MG tablet; Take 2 tablets (80 mg total) by mouth daily.  Dispense: 90 tablet; Refill: 1 - Lipid panel  3. Health care maintenance - Routine labs will be checked - Comp Met (CMET); Future - CBC w/Diff; Future - CBC w/Diff - Comp Met (CMET)  4. Hyperlipidemia, unspecified hyperlipidemia type -She will continue current medication, low fat/non cholesterol diet and exercise as tolerated. - atorvastatin (LIPITOR) 40 MG tablet; Take 2 tablets (80 mg total) by mouth daily.  Dispense: 90 tablet; Refill: 1    Return in about 13 weeks (around 09/11/2023), or if symptoms worsen or fail to improve.    Jaquel Coomer Trellis Paganini, NP

## 2023-06-12 NOTE — Patient Instructions (Signed)
DASH Eating Plan DASH stands for Dietary Approaches to Stop Hypertension. The DASH eating plan is a healthy eating plan that has been shown to: Lower high blood pressure (hypertension). Reduce your risk for type 2 diabetes, heart disease, and stroke. Help with weight loss. What are tips for following this plan? Reading food labels Check food labels for the amount of salt (sodium) per serving. Choose foods with less than 5 percent of the Daily Value (DV) of sodium. In general, foods with less than 300 milligrams (mg) of sodium per serving fit into this eating plan. To find whole grains, look for the word "whole" as the first word in the ingredient list. Shopping Buy products labeled as "low-sodium" or "no salt added." Buy fresh foods. Avoid canned foods and pre-made or frozen meals. Cooking Try not to add salt when you cook. Use salt-free seasonings or herbs instead of table salt or sea salt. Check with your health care provider or pharmacist before using salt substitutes. Do not fry foods. Cook foods in healthy ways, such as baking, boiling, grilling, roasting, or broiling. Cook using oils that are good for your heart. These include olive, canola, avocado, soybean, and sunflower oil. Meal planning  Eat a balanced diet. This should include: 4 or more servings of fruits and 4 or more servings of vegetables each day. Try to fill half of your plate with fruits and vegetables. 6-8 servings of whole grains each day. 6 or less servings of lean meat, poultry, or fish each day. 1 oz is 1 serving. A 3 oz (85 g) serving of meat is about the same size as the palm of your hand. One egg is 1 oz (28 g). 2-3 servings of low-fat dairy each day. One serving is 1 cup (237 mL). 1 serving of nuts, seeds, or beans 5 times each week. 2-3 servings of heart-healthy fats. Healthy fats called omega-3 fatty acids are found in foods such as walnuts, flaxseeds, fortified milks, and eggs. These fats are also found in  cold-water fish, such as sardines, salmon, and mackerel. Limit how much you eat of: Canned or prepackaged foods. Food that is high in trans fat, such as fried foods. Food that is high in saturated fat, such as fatty meat. Desserts and other sweets, sugary drinks, and other foods with added sugar. Full-fat dairy products. Do not salt foods before eating. Do not eat more than 4 egg yolks a week. Try to eat at least 2 vegetarian meals a week. Eat more home-cooked food and less restaurant, buffet, and fast food. Lifestyle When eating at a restaurant, ask if your food can be made with less salt or no salt. If you drink alcohol: Limit how much you have to: 0-1 drink a day if you are female. 0-2 drinks a day if you are female. Know how much alcohol is in your drink. In the [place] one drink is one 12 oz bottle of beer (355 mL), one 5 oz glass of wine (148 mL), or one 1 oz glass of hard liquor (44 mL). General information Avoid eating more than 2,300 mg of salt a day. If you have hypertension, you may need to reduce your sodium intake to 1,500 mg a day. Work with your provider to stay at a healthy body weight or lose weight. Ask what the best weight range is for you. On most days of the week, get at least 30 minutes of exercise that causes your heart to beat faster. This may include walking, swimming, or  biking. Work with your provider or dietitian to adjust your eating plan to meet your specific calorie needs. What foods should I eat? Fruits All fresh, dried, or frozen fruit. Canned fruits that are in their natural juice and do not have sugar added to them. Vegetables Fresh or frozen vegetables that are raw, steamed, roasted, or grilled. Low-sodium or reduced-sodium tomato and vegetable juice. Low-sodium or reduced-sodium tomato sauce and tomato paste. Low-sodium or reduced-sodium canned vegetables. Grains Whole-grain or whole-wheat bread. Whole-grain or whole-wheat pasta. Brown rice. Laurie Kirby. Bulgur. Whole-grain and low-sodium cereals. Pita bread. Low-fat, low-sodium crackers. Whole-wheat flour tortillas. Meats and other proteins Skinless chicken or [place]. Ground chicken or [place]. Pork with fat trimmed off. Fish and seafood. Egg whites. Dried beans, peas, or lentils. Unsalted nuts, nut butters, and seeds. Unsalted canned beans. Lean cuts of beef with fat trimmed off. Low-sodium, lean precooked or cured meat, such as sausages or meat loaves. Dairy Low-fat (1%) or fat-free (skim) milk. Reduced-fat, low-fat, or fat-free cheeses. Nonfat, low-sodium ricotta or cottage cheese. Low-fat or nonfat yogurt. Low-fat, low-sodium cheese. Fats and oils Soft margarine without trans fats. Vegetable oil. Reduced-fat, low-fat, or light mayonnaise and salad dressings (reduced-sodium). Canola, safflower, olive, avocado, soybean, and sunflower oils. Avocado. Seasonings and condiments Herbs. Spices. Seasoning mixes without salt. Other foods Unsalted popcorn and pretzels. Fat-free sweets. The items listed above may not be all the foods and drinks you can have. Talk to a dietitian to learn more. What foods should I avoid? Fruits Canned fruit in a light or heavy syrup. Fried fruit. Fruit in cream or butter sauce. Vegetables Creamed or fried vegetables. Vegetables in a cheese sauce. Regular canned vegetables that are not marked as low-sodium or reduced-sodium. Regular canned tomato sauce and paste that are not marked as low-sodium or reduced-sodium. Regular tomato and vegetable juices that are not marked as low-sodium or reduced-sodium. Laurie Kirby. Olives. Grains Baked goods made with fat, such as croissants, muffins, or some breads. Dry pasta or rice meal packs. Meats and other proteins Fatty cuts of meat. Ribs. Fried meat. Laurie Kirby. Bologna, salami, and other precooked or cured meats, such as sausages or meat loaves, that are not lean and low in sodium. Fat from the back of a pig (fatback). Bratwurst.  Salted nuts and seeds. Canned beans with added salt. Canned or smoked fish. Whole eggs or egg yolks. Chicken or [place] with skin. Dairy Whole or 2% milk, cream, and half-and-half. Whole or full-fat cream cheese. Whole-fat or sweetened yogurt. Full-fat cheese. Nondairy creamers. Whipped toppings. Processed cheese and cheese spreads. Fats and oils Butter. Stick margarine. Lard. Shortening. Ghee. Bacon fat. Tropical oils, such as coconut, palm kernel, or palm oil. Seasonings and condiments Onion salt, garlic salt, seasoned salt, table salt, and sea salt. Worcestershire sauce. Tartar sauce. Barbecue sauce. Teriyaki sauce. Soy sauce, including reduced-sodium soy sauce. Steak sauce. Canned and packaged gravies. Fish sauce. Oyster sauce. Cocktail sauce. Store-bought horseradish. Ketchup. Mustard. Meat flavorings and tenderizers. Bouillon cubes. Hot sauces. Pre-made or packaged marinades. Pre-made or packaged taco seasonings. Relishes. Regular salad dressings. Other foods Salted popcorn and pretzels. The items listed above may not be all the foods and drinks you should avoid. Talk to a dietitian to learn more. Where to find more information National Heart, Lung, and Blood Institute (NHLBI): BuffaloDryCleaner.gl American Heart Association (AHA): heart.org Academy of Nutrition and Dietetics: eatright.org National Kidney Foundation (NKF): kidney.org This information is not intended to replace advice given to you by your health care provider. Make sure  you discuss any questions you have with your health care provider. Document Revised: 10/31/2022 Document Reviewed: 10/31/2022 Elsevier Patient Education  2024 [employer] Carbohydrate Counting for Diabetes Mellitus, Adult Carbohydrate counting is a method of keeping track of how many carbohydrates you eat. Eating carbohydrates increases the amount of sugar (glucose) in the blood. Counting how many carbohydrates you eat improves how well you manage your blood  glucose. This, in turn, helps you manage your diabetes. Carbohydrates are measured in grams (g) per serving. It is important to know how many carbohydrates (in grams or by serving size) you can have in each meal. This is different for every person. A dietitian can help you make a meal plan and calculate how many carbohydrates you should have at each meal and snack. What foods contain carbohydrates? Carbohydrates are found in the following foods: Grains, such as breads and cereals. Dried beans and soy products. Starchy vegetables, such as potatoes, peas, and corn. Fruit and fruit juices. Milk and yogurt. Sweets and snack foods, such as cake, cookies, candy, chips, and soft drinks. How do I count carbohydrates in foods? There are two ways to count carbohydrates in food. You can read food labels or learn standard serving sizes of foods. You can use either of these methods or a combination of both. Using the Nutrition Facts label The Nutrition Facts list is included on the labels of almost all packaged foods and beverages in the [place]. It includes: The serving size. Information about nutrients in each serving, including the grams of carbohydrate per serving. To use the Nutrition Facts, decide how many servings you will have. Then, multiply the number of servings by the number of carbohydrates per serving. The resulting number is the total grams of carbohydrates that you will be having. Learning the standard serving sizes of foods When you eat carbohydrate foods that are not packaged or do not include Nutrition Facts on the label, you need to measure the servings in order to count the grams of carbohydrates. Measure the foods that you will eat with a food scale or measuring cup, if needed. Decide how many standard-size servings you will eat. Multiply the number of servings by 15. For foods that contain carbohydrates, one serving equals 15 g of carbohydrates. For example, if you eat 2 cups or  10 oz (300 g) of strawberries, you will have eaten 2 servings and 30 g of carbohydrates (2 servings x 15 g = 30 g). For foods that have more than one food mixed, such as soups and casseroles, you must count the carbohydrates in each food that is included. The following list contains standard serving sizes of common carbohydrate-rich foods. Each of these servings has about 15 g of carbohydrates: 1 slice of bread. 1 six-inch (15 cm) tortilla. ? cup or 2 oz (53 g) cooked rice or pasta.  cup or 3 oz (85 g) cooked or canned, drained and rinsed beans or lentils.  cup or 3 oz (85 g) starchy vegetable, such as peas, corn, or squash.  cup or 4 oz (120 g) hot cereal.  cup or 3 oz (85 g) boiled or mashed potatoes, or  or 3 oz (85 g) of a large baked potato.  cup or 4 fl oz (118 mL) fruit juice. 1 cup or 8 fl oz (237 mL) milk. 1 small or 4 oz (106 g) apple.  or 2 oz (63 g) of a medium banana. 1 cup or 5 oz (150 g) strawberries. 3 cups or 1  oz (28.3 g) popped popcorn. What is an example of carbohydrate counting? To calculate the grams of carbohydrates in this sample meal, follow the steps shown below. Sample meal 3 oz (85 g) chicken breast. ? cup or 4 oz (106 g) brown rice.  cup or 3 oz (85 g) corn. 1 cup or 8 fl oz (237 mL) milk. 1 cup or 5 oz (150 g) strawberries with sugar-free whipped topping. Carbohydrate calculation Identify the foods that contain carbohydrates: Rice. Corn. Milk. Strawberries. Calculate how many servings you have of each food: 2 servings rice. 1 serving corn. 1 serving milk. 1 serving strawberries. Multiply each number of servings by 15 g: 2 servings rice x 15 g = 30 g. 1 serving corn x 15 g = 15 g. 1 serving milk x 15 g = 15 g. 1 serving strawberries x 15 g = 15 g. Add together all of the amounts to find the total grams of carbohydrates eaten: 30 g + 15 g + 15 g + 15 g = 75 g of carbohydrates total. What are tips for following this plan? Shopping Develop  a meal plan and then make a shopping list. Buy fresh and frozen vegetables, fresh and frozen fruit, dairy, eggs, beans, lentils, and whole grains. Look at food labels. Choose foods that have more fiber and less sugar. Avoid processed foods and foods with added sugars. Meal planning Aim to have the same number of grams of carbohydrates at each meal and for each snack time. Plan to have regular, balanced meals and snacks. Where to find more information American Diabetes Association: diabetes.org Centers for Disease Control and Prevention: TonerPromos.no Academy of Nutrition and Dietetics: eatright.org Association of Diabetes Care & Education Specialists: diabeteseducator.org Summary Carbohydrate counting is a method of keeping track of how many carbohydrates you eat. Eating carbohydrates increases the amount of sugar (glucose) in your blood. Counting how many carbohydrates you eat improves how well you manage your blood glucose. This helps you manage your diabetes. A dietitian can help you make a meal plan and calculate how many carbohydrates you should have at each meal and snack. This information is not intended to replace advice given to you by your health care provider. Make sure you discuss any questions you have with your health care provider. Document Revised: 05/17/2020 Document Reviewed: 05/17/2020 Elsevier Patient Education  2024 [employer].

## 2023-06-13 ENCOUNTER — Other Ambulatory Visit: Payer: Self-pay

## 2023-06-13 LAB — LIPID PANEL
Chol/HDL Ratio: 3.3 ratio (ref 0.0–4.4)
Cholesterol, Total: 166 mg/dL (ref 100–199)
HDL: 50 mg/dL (ref >39)
LDL Chol Calc (NIH): 95 mg/dL (ref 0–99)
Triglycerides: 115 mg/dL (ref 0–149)
VLDL Cholesterol Cal: 21 mg/dL (ref 5–40)

## 2023-06-13 LAB — COMPREHENSIVE METABOLIC PANEL
ALT: 14 IU/L (ref 0–32)
AST: 14 IU/L (ref 0–40)
Albumin: 4.2 g/dL (ref 3.9–4.9)
Alkaline Phosphatase: 57 IU/L (ref 44–121)
BUN/Creatinine Ratio: 19 (ref 12–28)
BUN: 17 mg/dL (ref 8–27)
Bilirubin Total: 0.3 mg/dL (ref 0.0–1.2)
CO2: 24 mmol/L (ref 20–29)
Calcium: 9.7 mg/dL (ref 8.7–10.3)
Chloride: 107 mmol/L — ABNORMAL HIGH (ref 96–106)
Creatinine, Ser: 0.9 mg/dL (ref 0.57–1.00)
Globulin, Total: 2.6 g/dL (ref 1.5–4.5)
Glucose: 154 mg/dL — ABNORMAL HIGH (ref 70–99)
Potassium: 4.3 mmol/L (ref 3.5–5.2)
Sodium: 144 mmol/L (ref 134–144)
Total Protein: 6.8 g/dL (ref 6.0–8.5)
eGFR: 73 mL/min/{1.73_m2} (ref >59)

## 2023-06-13 LAB — CBC WITH DIFFERENTIAL/PLATELET
Basophils Absolute: 0.1 10*3/uL (ref 0.0–0.2)
Basos: 0 %
EOS (ABSOLUTE): 0.2 10*3/uL (ref 0.0–0.4)
Eos: 1 %
Hematocrit: 41.2 % (ref 34.0–46.6)
Hemoglobin: 13.3 g/dL (ref 11.1–15.9)
Immature Grans (Abs): 0 10*3/uL (ref 0.0–0.1)
Immature Granulocytes: 0 %
Lymphocytes Absolute: 5 10*3/uL — ABNORMAL HIGH (ref 0.7–3.1)
Lymphs: 33 %
MCH: 29.1 pg (ref 26.6–33.0)
MCHC: 32.3 g/dL (ref 31.5–35.7)
MCV: 90 fL (ref 79–97)
Monocytes Absolute: 0.7 10*3/uL (ref 0.1–0.9)
Monocytes: 4 %
Neutrophils Absolute: 9.3 10*3/uL — ABNORMAL HIGH (ref 1.4–7.0)
Neutrophils: 62 %
Platelets: 287 10*3/uL (ref 150–450)
RBC: 4.57 x10E6/uL (ref 3.77–5.28)
RDW: 12.5 % (ref 11.7–15.4)
WBC: 15.3 10*3/uL — ABNORMAL HIGH (ref 3.4–10.8)

## 2023-06-13 LAB — HEMOGLOBIN A1C
Est. average glucose Bld gHb Est-mCnc: 171 mg/dL
Hgb A1c MFr Bld: 7.6 % — ABNORMAL HIGH (ref 4.8–5.6)

## 2023-07-08 ENCOUNTER — Other Ambulatory Visit: Payer: Self-pay

## 2023-09-11 ENCOUNTER — Ambulatory Visit: Payer: Self-pay | Admitting: Gerontology

## 2023-09-11 VITALS — BP 136/76 | HR 76 | Temp 98.0°F | Resp 16 | Wt 154.0 lb

## 2023-09-11 DIAGNOSIS — I25119 Atherosclerotic heart disease of native coronary artery with unspecified angina pectoris: Secondary | ICD-10-CM

## 2023-09-11 DIAGNOSIS — E782 Mixed hyperlipidemia: Secondary | ICD-10-CM

## 2023-09-11 DIAGNOSIS — K219 Gastro-esophageal reflux disease without esophagitis: Secondary | ICD-10-CM

## 2023-09-11 DIAGNOSIS — E119 Type 2 diabetes mellitus without complications: Secondary | ICD-10-CM

## 2023-09-11 DIAGNOSIS — E785 Hyperlipidemia, unspecified: Secondary | ICD-10-CM

## 2023-09-11 DIAGNOSIS — D72829 Elevated white blood cell count, unspecified: Secondary | ICD-10-CM

## 2023-09-11 DIAGNOSIS — I10 Essential (primary) hypertension: Secondary | ICD-10-CM

## 2023-09-11 LAB — POCT GLYCOSYLATED HEMOGLOBIN (HGB A1C): Hemoglobin A1C: 6.6 % — AB (ref 4.0–5.6)

## 2023-09-11 LAB — GLUCOSE, POCT (MANUAL RESULT ENTRY): POC Glucose: 111 mg/dL — AB (ref 70–99)

## 2023-09-11 MED ORDER — ATORVASTATIN CALCIUM 40 MG PO TABS
80.0000 mg | ORAL_TABLET | Freq: Every day | ORAL | 1 refills | Status: DC
Start: 2023-09-11 — End: 2023-12-11
  Filled 2023-09-11: qty 90, 45d supply, fill #0
  Filled 2023-11-26: qty 16, 8d supply, fill #1
  Filled 2023-11-27: qty 74, 37d supply, fill #1

## 2023-09-11 MED ORDER — FAMOTIDINE 20 MG PO TABS
20.0000 mg | ORAL_TABLET | Freq: Two times a day (BID) | ORAL | 1 refills | Status: DC | PRN
  Filled 2023-09-11 – 2023-11-26 (×2): qty 180, 90d supply, fill #0

## 2023-09-11 MED ORDER — GLIPIZIDE 10 MG PO TABS
20.0000 mg | ORAL_TABLET | Freq: Every day | ORAL | 1 refills | Status: DC
  Filled 2023-09-11 – 2023-11-26 (×2): qty 180, 90d supply, fill #0

## 2023-09-11 MED ORDER — METFORMIN HCL 1000 MG PO TABS
1500.0000 mg | ORAL_TABLET | Freq: Every day | ORAL | 0 refills | Status: DC
  Filled 2023-09-11 – 2023-11-26 (×2): qty 270, 180d supply, fill #0

## 2023-09-11 MED ORDER — AMLODIPINE BESYLATE 5 MG PO TABS
5.0000 mg | ORAL_TABLET | Freq: Every day | ORAL | 1 refills | Status: DC
Start: 2023-09-11 — End: 2024-03-11
  Filled 2023-09-11 – 2023-11-26 (×2): qty 90, 90d supply, fill #0

## 2023-09-11 MED ORDER — LISINOPRIL 40 MG PO TABS
40.0000 mg | ORAL_TABLET | Freq: Every day | ORAL | 1 refills | Status: DC
  Filled 2023-09-11 – 2023-11-26 (×2): qty 90, 90d supply, fill #0

## 2023-09-11 MED ORDER — METOPROLOL SUCCINATE ER 100 MG PO TB24
200.0000 mg | ORAL_TABLET | Freq: Every day | ORAL | 1 refills | Status: DC
  Filled 2023-09-11 – 2023-11-26 (×2): qty 180, 90d supply, fill #0

## 2023-09-11 MED ORDER — CLOPIDOGREL BISULFATE 75 MG PO TABS
75.0000 mg | ORAL_TABLET | Freq: Every day | ORAL | 1 refills | Status: DC
Start: 2023-09-11 — End: 2024-03-11
  Filled 2023-09-11 – 2023-11-26 (×2): qty 90, 90d supply, fill #0

## 2023-09-11 MED ORDER — ISOSORBIDE MONONITRATE ER 30 MG PO TB24
30.0000 mg | ORAL_TABLET | Freq: Every day | ORAL | 1 refills | Status: DC
  Filled 2023-09-11 – 2023-11-26 (×2): qty 90, 90d supply, fill #0

## 2023-09-11 NOTE — Patient Instructions (Signed)
DASH Eating Plan DASH stands for Dietary Approaches to Stop Hypertension. The DASH eating plan is a healthy eating plan that has been shown to: Lower high blood pressure (hypertension). Reduce your risk for type 2 diabetes, heart disease, and stroke. Help with weight loss. What are tips for following this plan? Reading food labels Check food labels for the amount of salt (sodium) per serving. Choose foods with less than 5 percent of the Daily Value (DV) of sodium. In general, foods with less than 300 milligrams (mg) of sodium per serving fit into this eating plan. To find whole grains, look for the word "whole" as the first word in the ingredient list. Shopping Buy products labeled as "low-sodium" or "no salt added." Buy fresh foods. Avoid canned foods and pre-made or frozen meals. Cooking Try not to add salt when you cook. Use salt-free seasonings or herbs instead of table salt or sea salt. Check with your health care provider or pharmacist before using salt substitutes. Do not fry foods. Cook foods in healthy ways, such as baking, boiling, grilling, roasting, or broiling. Cook using oils that are good for your heart. These include olive, canola, avocado, soybean, and sunflower oil. Meal planning  Eat a balanced diet. This should include: 4 or more servings of fruits and 4 or more servings of vegetables each day. Try to fill half of your plate with fruits and vegetables. 6-8 servings of whole grains each day. 6 or less servings of lean meat, poultry, or fish each day. 1 oz is 1 serving. A 3 oz (85 g) serving of meat is about the same size as the palm of your hand. One egg is 1 oz (28 g). 2-3 servings of low-fat dairy each day. One serving is 1 cup (237 mL). 1 serving of nuts, seeds, or beans 5 times each week. 2-3 servings of heart-healthy fats. Healthy fats called omega-3 fatty acids are found in foods such as walnuts, flaxseeds, fortified milks, and eggs. These fats are also found in  cold-water fish, such as sardines, salmon, and mackerel. Limit how much you eat of: Canned or prepackaged foods. Food that is high in trans fat, such as fried foods. Food that is high in saturated fat, such as fatty meat. Desserts and other sweets, sugary drinks, and other foods with added sugar. Full-fat dairy products. Do not salt foods before eating. Do not eat more than 4 egg yolks a week. Try to eat at least 2 vegetarian meals a week. Eat more home-cooked food and less restaurant, buffet, and fast food. Lifestyle When eating at a restaurant, ask if your food can be made with less salt or no salt. If you drink alcohol: Limit how much you have to: 0-1 drink a day if you are female. 0-2 drinks a day if you are female. Know how much alcohol is in your drink. In the [place] one drink is one 12 oz bottle of beer (355 mL), one 5 oz glass of wine (148 mL), or one 1 oz glass of hard liquor (44 mL). General information Avoid eating more than 2,300 mg of salt a day. If you have hypertension, you may need to reduce your sodium intake to 1,500 mg a day. Work with your provider to stay at a healthy body weight or lose weight. Ask what the best weight range is for you. On most days of the week, get at least 30 minutes of exercise that causes your heart to beat faster. This may include walking, swimming, or  biking. Work with your provider or dietitian to adjust your eating plan to meet your specific calorie needs. What foods should I eat? Fruits All fresh, dried, or frozen fruit. Canned fruits that are in their natural juice and do not have sugar added to them. Vegetables Fresh or frozen vegetables that are raw, steamed, roasted, or grilled. Low-sodium or reduced-sodium tomato and vegetable juice. Low-sodium or reduced-sodium tomato sauce and tomato paste. Low-sodium or reduced-sodium canned vegetables. Grains Whole-grain or whole-wheat bread. Whole-grain or whole-wheat pasta. Brown rice. Orpah Cobb. Bulgur. Whole-grain and low-sodium cereals. Pita bread. Low-fat, low-sodium crackers. Whole-wheat flour tortillas. Meats and other proteins Skinless chicken or [place]. Ground chicken or [place]. Pork with fat trimmed off. Fish and seafood. Egg whites. Dried beans, peas, or lentils. Unsalted nuts, nut butters, and seeds. Unsalted canned beans. Lean cuts of beef with fat trimmed off. Low-sodium, lean precooked or cured meat, such as sausages or meat loaves. Dairy Low-fat (1%) or fat-free (skim) milk. Reduced-fat, low-fat, or fat-free cheeses. Nonfat, low-sodium ricotta or cottage cheese. Low-fat or nonfat yogurt. Low-fat, low-sodium cheese. Fats and oils Soft margarine without trans fats. Vegetable oil. Reduced-fat, low-fat, or light mayonnaise and salad dressings (reduced-sodium). Canola, safflower, olive, avocado, soybean, and sunflower oils. Avocado. Seasonings and condiments Herbs. Spices. Seasoning mixes without salt. Other foods Unsalted popcorn and pretzels. Fat-free sweets. The items listed above may not be all the foods and drinks you can have. Talk to a dietitian to learn more. What foods should I avoid? Fruits Canned fruit in a light or heavy syrup. Fried fruit. Fruit in cream or butter sauce. Vegetables Creamed or fried vegetables. Vegetables in a cheese sauce. Regular canned vegetables that are not marked as low-sodium or reduced-sodium. Regular canned tomato sauce and paste that are not marked as low-sodium or reduced-sodium. Regular tomato and vegetable juices that are not marked as low-sodium or reduced-sodium. Rosita Fire. Olives. Grains Baked goods made with fat, such as croissants, muffins, or some breads. Dry pasta or rice meal packs. Meats and other proteins Fatty cuts of meat. Ribs. Fried meat. Tomasa Blase. Bologna, salami, and other precooked or cured meats, such as sausages or meat loaves, that are not lean and low in sodium. Fat from the back of a pig (fatback). Bratwurst.  Salted nuts and seeds. Canned beans with added salt. Canned or smoked fish. Whole eggs or egg yolks. Chicken or [place] with skin. Dairy Whole or 2% milk, cream, and half-and-half. Whole or full-fat cream cheese. Whole-fat or sweetened yogurt. Full-fat cheese. Nondairy creamers. Whipped toppings. Processed cheese and cheese spreads. Fats and oils Butter. Stick margarine. Lard. Shortening. Ghee. Bacon fat. Tropical oils, such as coconut, palm kernel, or palm oil. Seasonings and condiments Onion salt, garlic salt, seasoned salt, table salt, and sea salt. Worcestershire sauce. Tartar sauce. Barbecue sauce. Teriyaki sauce. Soy sauce, including reduced-sodium soy sauce. Steak sauce. Canned and packaged gravies. Fish sauce. Oyster sauce. Cocktail sauce. Store-bought horseradish. Ketchup. Mustard. Meat flavorings and tenderizers. Bouillon cubes. Hot sauces. Pre-made or packaged marinades. Pre-made or packaged taco seasonings. Relishes. Regular salad dressings. Other foods Salted popcorn and pretzels. The items listed above may not be all the foods and drinks you should avoid. Talk to a dietitian to learn more. Where to find more information National Heart, Lung, and Blood Institute (NHLBI): BuffaloDryCleaner.gl American Heart Association (AHA): heart.org Academy of Nutrition and Dietetics: eatright.org National Kidney Foundation (NKF): kidney.org This information is not intended to replace advice given to you by your health care provider. Make sure  you discuss any questions you have with your health care provider. Document Revised: 10/31/2022 Document Reviewed: 10/31/2022 Elsevier Patient Education  2024 Elsevier Inc. Carbohydrate Counting for Diabetes Mellitus, Adult Carbohydrate counting is a method of keeping track of how many carbohydrates you eat. Eating carbohydrates increases the amount of sugar (glucose) in the blood. Counting how many carbohydrates you eat improves how well you manage your blood  glucose. This, in turn, helps you manage your diabetes. Carbohydrates are measured in grams (g) per serving. It is important to know how many carbohydrates (in grams or by serving size) you can have in each meal. This is different for every person. A dietitian can help you make a meal plan and calculate how many carbohydrates you should have at each meal and snack. What foods contain carbohydrates? Carbohydrates are found in the following foods: Grains, such as breads and cereals. Dried beans and soy products. Starchy vegetables, such as potatoes, peas, and corn. Fruit and fruit juices. Milk and yogurt. Sweets and snack foods, such as cake, cookies, candy, chips, and soft drinks. How do I count carbohydrates in foods? There are two ways to count carbohydrates in food. You can read food labels or learn standard serving sizes of foods. You can use either of these methods or a combination of both. Using the Nutrition Facts label The Nutrition Facts list is included on the labels of almost all packaged foods and beverages in the [place]. It includes: The serving size. Information about nutrients in each serving, including the grams of carbohydrate per serving. To use the Nutrition Facts, decide how many servings you will have. Then, multiply the number of servings by the number of carbohydrates per serving. The resulting number is the total grams of carbohydrates that you will be having. Learning the standard serving sizes of foods When you eat carbohydrate foods that are not packaged or do not include Nutrition Facts on the label, you need to measure the servings in order to count the grams of carbohydrates. Measure the foods that you will eat with a food scale or measuring cup, if needed. Decide how many standard-size servings you will eat. Multiply the number of servings by 15. For foods that contain carbohydrates, one serving equals 15 g of carbohydrates. For example, if you eat 2 cups or  10 oz (300 g) of strawberries, you will have eaten 2 servings and 30 g of carbohydrates (2 servings x 15 g = 30 g). For foods that have more than one food mixed, such as soups and casseroles, you must count the carbohydrates in each food that is included. The following list contains standard serving sizes of common carbohydrate-rich foods. Each of these servings has about 15 g of carbohydrates: 1 slice of bread. 1 six-inch (15 cm) tortilla. ? cup or 2 oz (53 g) cooked rice or pasta.  cup or 3 oz (85 g) cooked or canned, drained and rinsed beans or lentils.  cup or 3 oz (85 g) starchy vegetable, such as peas, corn, or squash.  cup or 4 oz (120 g) hot cereal.  cup or 3 oz (85 g) boiled or mashed potatoes, or  or 3 oz (85 g) of a large baked potato.  cup or 4 fl oz (118 mL) fruit juice. 1 cup or 8 fl oz (237 mL) milk. 1 small or 4 oz (106 g) apple.  or 2 oz (63 g) of a medium banana. 1 cup or 5 oz (150 g) strawberries. 3 cups or 1  oz (28.3 g) popped popcorn. What is an example of carbohydrate counting? To calculate the grams of carbohydrates in this sample meal, follow the steps shown below. Sample meal 3 oz (85 g) chicken breast. ? cup or 4 oz (106 g) brown rice.  cup or 3 oz (85 g) corn. 1 cup or 8 fl oz (237 mL) milk. 1 cup or 5 oz (150 g) strawberries with sugar-free whipped topping. Carbohydrate calculation Identify the foods that contain carbohydrates: Rice. Corn. Milk. Strawberries. Calculate how many servings you have of each food: 2 servings rice. 1 serving corn. 1 serving milk. 1 serving strawberries. Multiply each number of servings by 15 g: 2 servings rice x 15 g = 30 g. 1 serving corn x 15 g = 15 g. 1 serving milk x 15 g = 15 g. 1 serving strawberries x 15 g = 15 g. Add together all of the amounts to find the total grams of carbohydrates eaten: 30 g + 15 g + 15 g + 15 g = 75 g of carbohydrates total. What are tips for following this plan? Shopping Develop  a meal plan and then make a shopping list. Buy fresh and frozen vegetables, fresh and frozen fruit, dairy, eggs, beans, lentils, and whole grains. Look at food labels. Choose foods that have more fiber and less sugar. Avoid processed foods and foods with added sugars. Meal planning Aim to have the same number of grams of carbohydrates at each meal and for each snack time. Plan to have regular, balanced meals and snacks. Where to find more information American Diabetes Association: diabetes.org Centers for Disease Control and Prevention: TonerPromos.no Academy of Nutrition and Dietetics: eatright.org Association of Diabetes Care & Education Specialists: diabeteseducator.org Summary Carbohydrate counting is a method of keeping track of how many carbohydrates you eat. Eating carbohydrates increases the amount of sugar (glucose) in your blood. Counting how many carbohydrates you eat improves how well you manage your blood glucose. This helps you manage your diabetes. A dietitian can help you make a meal plan and calculate how many carbohydrates you should have at each meal and snack. This information is not intended to replace advice given to you by your health care provider. Make sure you discuss any questions you have with your health care provider. Document Revised: 05/17/2020 Document Reviewed: 05/17/2020 Elsevier Patient Education  2024 [employer].

## 2023-09-11 NOTE — Progress Notes (Signed)
Established Patient Office Visit  Subjective   Patient ID: Laurie Kirby, female    DOB: [birth year]  Age: 62 y.o. MRN: [id number]    HPI  Laurie Kirby is a 62 y/o female with a  history of Diabetes, Hyperlipidemia, Hypertension presents for follow up and lab review and medication refill. Her HgbA1c checked during visit decreased from 8.2% to 6.6% and her blood glucose was 111 mg/dl. She states that she's compliant with her medications, denies side effects and continues to make healthy life style changes. She states that she does not check her blood glucose. She denies hypo/hyperglycemic symptoms, peripheral neuropathy and performs daily foot checks. Overall, she states that she's doing well and offers no further complaint.    Patient Active Problem List   Diagnosis Date Noted   Congenital asymmetric septal hypertrophy 12/09/2022   NSTEMI (non-ST elevated myocardial infarction) [facility])    Chest pain 03/23/2022   Health care maintenance 02/15/2021   History of leukocytosis 08/14/2019   Essential hypertension 12/01/2018   Right hip pain 03/12/2018   Tobacco abuse 09/03/2017   Abnormal Scholten blood cell (WBC) 08/28/2017   Hyperlipidemia 01/13/2016   Abnormal Pap smear of cervix 11/30/2015   Diabetes mellitus without complication (HCC) 07/27/2015   Coronary atherosclerosis of native coronary artery 07/27/2015   Post-menopausal bleeding 03/15/2014   Past Medical History:  Diagnosis Date   CAD (coronary artery disease)    Diabetes mellitus without complication (HCC)    Hyperlipidemia    Hypertension    Hypertrophic cardiomyopathy (HCC)    Past Surgical History:  Procedure Laterality Date   CORONARY ANGIOPLASTY WITH STENT PLACEMENT     Family History  Problem Relation Age of Onset   Diabetes type II Mother    Diabetes type II Father    Heart disease Father    Cancer Maternal Aunt    Breast cancer Cousin    No Known Allergies    Review of Systems  Constitutional:  Negative.   Eyes: Negative.   Respiratory: Negative.    Cardiovascular: Negative.   Genitourinary: Negative.   Skin: Negative.   Neurological: Negative.   Endo/Heme/Allergies: Negative.   Psychiatric/Behavioral: Negative.        Objective:     BP 136/76 (BP Location: Right Arm, Patient Position: Sitting, Cuff Size: Normal)   Pulse 76   Temp 98 F (36.7 C) (Oral)   Resp 16   Wt 154 lb (69.9 kg)   BMI 26.03 kg/m  BP Readings from Last 3 Encounters:  09/11/23 136/76  06/12/23 136/74  03/13/23 (!) 147/68   Wt Readings from Last 3 Encounters:  09/11/23 154 lb (69.9 kg)  06/12/23 152 lb 9.6 oz (69.2 kg)  03/13/23 156 lb 11.2 oz (71.1 kg)      Physical Exam Constitutional:      Appearance: Normal appearance.  HENT:     Head: Normocephalic and atraumatic.     Mouth/Throat:     Mouth: Mucous membranes are moist.  Eyes:     Pupils: Pupils are equal, round, and reactive to light.  Cardiovascular:     Rate and Rhythm: Normal rate and regular rhythm.     Pulses: Normal pulses.     Heart sounds: Normal heart sounds.  Pulmonary:     Effort: Pulmonary effort is normal.     Breath sounds: Normal breath sounds.  Abdominal:     General: Bowel sounds are normal.  Musculoskeletal:        General: Normal  range of motion.  Skin:    General: Skin is warm.  Neurological:     General: No focal deficit present.     Mental Status: She is alert and oriented to person, place, and time.  Psychiatric:        Mood and Affect: Mood normal.        Behavior: Behavior normal.      Results for orders placed or performed in visit on 09/11/23  POCT Glucose (CBG)  Result Value Ref Range   POC Glucose 111 (A) 70 - 99 mg/dl  POCT HgB W0J  Result Value Ref Range   Hemoglobin A1C 6.6 (A) 4.0 - 5.6 %   HbA1c POC (<> result, manual entry)     HbA1c, POC (prediabetic range)     HbA1c, POC (controlled diabetic range)      Last CBC Lab Results  Component Value Date   WBC 15.3 (H)  06/12/2023   HGB 13.3 06/12/2023   HCT 41.2 06/12/2023   MCV 90 06/12/2023   MCH 29.1 06/12/2023   RDW 12.5 06/12/2023   PLT 287 06/12/2023   Last metabolic panel Lab Results  Component Value Date   GLUCOSE 154 (H) 06/12/2023   NA 144 06/12/2023   K 4.3 06/12/2023   CL 107 (H) 06/12/2023   CO2 24 06/12/2023   BUN 17 06/12/2023   CREATININE 0.90 06/12/2023   EGFR 73 06/12/2023   CALCIUM 9.7 06/12/2023   PROT 6.8 06/12/2023   ALBUMIN 4.2 06/12/2023   LABGLOB 2.6 06/12/2023   AGRATIO 1.4 08/02/2021   BILITOT 0.3 06/12/2023   ALKPHOS 57 06/12/2023   AST 14 06/12/2023   ALT 14 06/12/2023   ANIONGAP 10 03/22/2022   Last lipids Lab Results  Component Value Date   CHOL 166 06/12/2023   HDL 50 06/12/2023   LDLCALC 95 06/12/2023   TRIG 115 06/12/2023   CHOLHDL 3.3 06/12/2023   Last hemoglobin A1c Lab Results  Component Value Date   HGBA1C 6.6 (A) 09/11/2023   Last thyroid functions Lab Results  Component Value Date   TSH 1.720 01/11/2021   Last vitamin D No results found for: "25OHVITD2", "25OHVITD3", "VD25OH" Last vitamin B12 and Folate No results found for: "VITAMINB12", "FOLATE"    The ASCVD Risk score (Arnett DK, et al., 2019) failed to calculate for the following reasons:   The patient has a prior MI or stroke diagnosis    Assessment & Plan:   1. Type 2 diabetes mellitus without complication, without long-term current use of insulin (HCC) - Her diabetes is improving, her HgbA1c was 6.6%, she will continue current medication, low carb/non concentrated sweet diet and exercise as tolerated. - POCT HgB A1C; Future - POCT Glucose (CBG); Future - metFORMIN (GLUCOPHAGE) 1000 MG tablet; Take 1.5 tablets (1,500 mg total) by mouth daily with breakfast.  Dispense: 270 tablet; Refill: 0  -glipiZIDE (GLUCOTROL) 10 MG tablet; Take 2 tablets (20 mg total) by mouth daily before breakfast.  Dispense: 180 tablet; Refill: 1 - POCT Glucose (CBG) - POCT HgB A1C  2.  Leukocytosis, unspecified type - Will recheck her WBC - CBC w/Diff; Future - CBC w/Diff  3. Essential hypertension - Her blood pressure is improving, she will continue current medication, DASH diet and exercise as tolerated. - amLODipine (NORVASC) 5 MG tablet; Take 1 tablet (5 mg total) by mouth daily.  Dispense: 90 tablet; Refill: 1 - lisinopril (ZESTRIL) 40 MG tablet; Take 1 tablet (40 mg total) by mouth daily in  the morning.  Dispense: 90 tablet; Refill: 1 - metoprolol succinate (TOPROL-XL) 100 MG 24 hr tablet; Take 2 tablets (200 mg total) by mouth daily. Take with or immediately following a meal.  Dispense: 180 tablet; Refill: 1  4. Hyperlipidemia, unspecified hyperlipidemia type - She will continue current medication, low fat/cholesterol diet and exercise as tolerated. - atorvastatin (LIPITOR) 40 MG tablet; Take 2 tablets (80 mg total) by mouth daily.  Dispense: 90 tablet; Refill: 1  5. 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 - clopidogrel (PLAVIX) 75 MG tablet; Take 1 tablet (75 mg total) by mouth daily.  Dispense: 90 tablet; Refill: 1 - isosorbide mononitrate (IMDUR) 30 MG 24 hr tablet; Take 1 tablet (30 mg total) by mouth daily.  Dispense: 90 tablet; Refill: 1  6. Gastroesophageal reflux disease without esophagitis - Her acid reflux is under control, will continue current medication. -Avoid spicy, fatty and fried food -Avoid sodas and sour juices -Avoid heavy meals -Avoid eating 4 hours before bedtime -Elevate head of bed at night - famotidine (PEPCID) 20 MG tablet; Take 1 tablet (20 mg total) by mouth 2 (two) times daily as needed for heartburn or indigestion.  Dispense: 180 tablet; Refill: 1     Return in about 13 weeks (around 12/11/2023), or if symptoms worsen or fail to improve.     Chioma Trellis Paganini, NP

## 2023-09-12 ENCOUNTER — Other Ambulatory Visit: Payer: Self-pay

## 2023-09-12 LAB — CBC WITH DIFFERENTIAL/PLATELET
Basophils Absolute: 0 10*3/uL (ref 0.0–0.2)
Basos: 0 %
EOS (ABSOLUTE): 0.2 10*3/uL (ref 0.0–0.4)
Eos: 1 %
Hematocrit: 42.2 % (ref 34.0–46.6)
Hemoglobin: 14 g/dL (ref 11.1–15.9)
Immature Grans (Abs): 0 10*3/uL (ref 0.0–0.1)
Immature Granulocytes: 0 %
Lymphocytes Absolute: 4.4 10*3/uL — ABNORMAL HIGH (ref 0.7–3.1)
Lymphs: 28 %
MCH: 30.4 pg (ref 26.6–33.0)
MCHC: 33.2 g/dL (ref 31.5–35.7)
MCV: 92 fL (ref 79–97)
Monocytes Absolute: 0.7 10*3/uL (ref 0.1–0.9)
Monocytes: 5 %
Neutrophils Absolute: 10.2 10*3/uL — ABNORMAL HIGH (ref 1.4–7.0)
Neutrophils: 66 %
Platelets: 298 10*3/uL (ref 150–450)
RBC: 4.61 x10E6/uL (ref 3.77–5.28)
RDW: 12.1 % (ref 11.7–15.4)
WBC: 15.6 10*3/uL — ABNORMAL HIGH (ref 3.4–10.8)

## 2023-10-02 ENCOUNTER — Other Ambulatory Visit: Payer: Self-pay | Admitting: Gerontology

## 2023-10-02 DIAGNOSIS — D72829 Elevated white blood cell count, unspecified: Secondary | ICD-10-CM

## 2023-10-14 ENCOUNTER — Encounter: Payer: Self-pay | Admitting: Oncology

## 2023-10-14 ENCOUNTER — Inpatient Hospital Stay: Payer: Self-pay

## 2023-10-14 ENCOUNTER — Inpatient Hospital Stay: Payer: Self-pay | Attending: Oncology | Admitting: Oncology

## 2023-10-14 VITALS — BP 148/70 | HR 67 | Temp 96.2°F | Resp 18 | Wt 152.6 lb

## 2023-10-14 DIAGNOSIS — Z809 Family history of malignant neoplasm, unspecified: Secondary | ICD-10-CM | POA: Insufficient documentation

## 2023-10-14 DIAGNOSIS — E785 Hyperlipidemia, unspecified: Secondary | ICD-10-CM | POA: Insufficient documentation

## 2023-10-14 DIAGNOSIS — Z720 Tobacco use: Secondary | ICD-10-CM

## 2023-10-14 DIAGNOSIS — F17210 Nicotine dependence, cigarettes, uncomplicated: Secondary | ICD-10-CM | POA: Insufficient documentation

## 2023-10-14 DIAGNOSIS — Z803 Family history of malignant neoplasm of breast: Secondary | ICD-10-CM | POA: Insufficient documentation

## 2023-10-14 DIAGNOSIS — Z7902 Long term (current) use of antithrombotics/antiplatelets: Secondary | ICD-10-CM | POA: Insufficient documentation

## 2023-10-14 DIAGNOSIS — Z79899 Other long term (current) drug therapy: Secondary | ICD-10-CM | POA: Insufficient documentation

## 2023-10-14 DIAGNOSIS — I2510 Atherosclerotic heart disease of native coronary artery without angina pectoris: Secondary | ICD-10-CM | POA: Insufficient documentation

## 2023-10-14 DIAGNOSIS — E119 Type 2 diabetes mellitus without complications: Secondary | ICD-10-CM | POA: Insufficient documentation

## 2023-10-14 DIAGNOSIS — D72829 Elevated white blood cell count, unspecified: Secondary | ICD-10-CM | POA: Insufficient documentation

## 2023-10-14 DIAGNOSIS — R011 Cardiac murmur, unspecified: Secondary | ICD-10-CM | POA: Insufficient documentation

## 2023-10-14 DIAGNOSIS — I10 Essential (primary) hypertension: Secondary | ICD-10-CM | POA: Insufficient documentation

## 2023-10-14 DIAGNOSIS — I422 Other hypertrophic cardiomyopathy: Secondary | ICD-10-CM | POA: Insufficient documentation

## 2023-10-14 DIAGNOSIS — Z8249 Family history of ischemic heart disease and other diseases of the circulatory system: Secondary | ICD-10-CM | POA: Insufficient documentation

## 2023-10-14 DIAGNOSIS — Z5971 Insufficient health insurance coverage: Secondary | ICD-10-CM | POA: Insufficient documentation

## 2023-10-14 DIAGNOSIS — Z833 Family history of diabetes mellitus: Secondary | ICD-10-CM | POA: Insufficient documentation

## 2023-10-14 LAB — HEPATITIS PANEL, ACUTE
HCV Ab: NONREACTIVE
Hep A IgM: NONREACTIVE
Hep B C IgM: REACTIVE — AB
Hepatitis B Surface Ag: NONREACTIVE

## 2023-10-14 LAB — CBC WITH DIFFERENTIAL/PLATELET
Abs Immature Granulocytes: 0.14 10*3/uL — ABNORMAL HIGH (ref 0.00–0.07)
Basophils Absolute: 0.1 10*3/uL (ref 0.0–0.1)
Basophils Relative: 0 %
Eosinophils Absolute: 0.1 10*3/uL (ref 0.0–0.5)
Eosinophils Relative: 1 %
HCT: 42.6 % (ref 36.0–46.0)
Hemoglobin: 13.9 g/dL (ref 12.0–15.0)
Immature Granulocytes: 1 %
Lymphocytes Relative: 21 %
Lymphs Abs: 3.8 10*3/uL (ref 0.7–4.0)
MCH: 30 pg (ref 26.0–34.0)
MCHC: 32.6 g/dL (ref 30.0–36.0)
MCV: 92 fL (ref 80.0–100.0)
Monocytes Absolute: 0.5 10*3/uL (ref 0.1–1.0)
Monocytes Relative: 3 %
Neutro Abs: 13.6 10*3/uL — ABNORMAL HIGH (ref 1.7–7.7)
Neutrophils Relative %: 74 %
Platelets: 313 10*3/uL (ref 150–400)
RBC: 4.63 MIL/uL (ref 3.87–5.11)
RDW: 12.6 % (ref 11.5–15.5)
WBC: 18.3 10*3/uL — ABNORMAL HIGH (ref 4.0–10.5)
nRBC: 0 % (ref 0.0–0.2)

## 2023-10-14 LAB — TECHNOLOGIST SMEAR REVIEW: Plt Morphology: ADEQUATE

## 2023-10-14 LAB — LACTATE DEHYDROGENASE: LDH: 121 U/L (ref 98–192)

## 2023-10-14 NOTE — Progress Notes (Signed)
Hematology/Oncology Consult Note [phone] [phone]     REFERRING PROVIDER: Rolm Gala, NP   CHIEF COMPLAINTS/REASON FOR VISIT:  Evaluation of leukocytosis  ASSESSMENT & PLAN:   Leukocytosis Elevated Breit blood cell count since 2012. No symptoms of infection, inflammation, or malignancy. Patient is a smoker, which could contribute to leukocytosis. labs reviewed and discussed with patient that Leukocytosis, differential diagnosis is broad, acute or chronic infection and inflammation, smoking, autoimmune disease, or underlying bone marrow disorders.   For the work up of patient's leukocytosis, I recommend checking CBC;CMP, LDH, smear review, peripheral flowcytometry, hepatitis, HIV, monoclonal gammopathy workup.     Tobacco abuse Recommend smoke cessation.   Patient has no insurance and expressed concerns about the cost of care. I offer her to make a follow up appt to go over results. She declined.  Patient declined assistance from a Child psychotherapist.  Orders Placed This Encounter  Procedures   CBC with Differential/Platelet    Standing Status:   Future    Number of Occurrences:   1    Expected Date:   10/14/2023    Expiration Date:   10/13/2024   Protein electrophoresis, serum    Standing Status:   Future    Number of Occurrences:   1    Expected Date:   10/14/2023    Expiration Date:   10/13/2024   Flow cytometry panel-leukemia/lymphoma work-up    Standing Status:   Future    Number of Occurrences:   1    Expected Date:   10/14/2023    Expiration Date:   10/13/2024   Lactate dehydrogenase    Standing Status:   Future    Number of Occurrences:   1    Expected Date:   10/14/2023    Expiration Date:   10/13/2024   Hepatitis panel, acute    Standing Status:   Future    Number of Occurrences:   1    Expected Date:   10/14/2023    Expiration Date:   10/13/2024   Technologist smear review    Standing Status:   Future    Number of  Occurrences:   1    Expected Date:   10/14/2023    Expiration Date:   10/13/2024    Clinical information::   leukocytosis    Return of visit: 3-4 weeks to discuss results.  Cc [name], [name], NP All questions were answered. The patient knows to call the clinic with any problems, questions or concerns.  Rickard Patience, MD, PhD [facility] Hematology Oncology 10/14/2023    HISTORY OF PRESENTING ILLNESS:  Laurie Kirby is a  62 y.o.  female with PMH listed below who was referred to me for evaluation of leukocytosis Reviewed patient' recent labs obtained by PCP.   CBC showed elevated Peel count of 15.6,  ANC 10.2, ALC 4.4  Previous lab records reviewed. Leukocytosis onset of chronic, duration is since 2012 Patient smokes 4-5 cigarettes daily.   Patient denies unintentional weight loss, fever, chills,night sweats.   Denies history of recent oral steroid use or steroid injection:  Denies recent infection, denies sinus congestion, cough, urinary frequency/urgency or dysuria, diarrhea, joint swelling/pain or abnormal skin rash, prosthetics. Denies autoimmune disease history.   The patient denies any known chronic inflammatory conditions such as skin issues, allergies, rheumatoid arthritis, lupus, or other autoimmune conditions. She also reports normal bowel movements and good appetite. The patient acknowledges a known heart murmur but does not provide further details  about its origin or management. She consumes alcohol occasionally.   MEDICAL HISTORY:  Past Medical History:  Diagnosis Date   CAD (coronary artery disease)    Diabetes mellitus without complication (HCC)    Hyperlipidemia    Hypertension    Hypertrophic cardiomyopathy (HCC)     SURGICAL HISTORY: Past Surgical History:  Procedure Laterality Date   CORONARY ANGIOPLASTY WITH STENT PLACEMENT      SOCIAL HISTORY: Social History   Socioeconomic History   Marital status: Single    Spouse name: Not on file   Number  of children: 3   Years of education: Not on file   Highest education level: High school graduate  Occupational History   Occupation: unemployed  Tobacco Use   Smoking status: Some Days    Current packs/day: 0.25    Types: Cigarettes   Smokeless tobacco: Never   Tobacco comments:    quit following having teeth removed - pt reports she smokes when she drinks (edited: 08/09/2020=2)    08/22/22 pt reports approximately 4 cigarettes on the weekend  Vaping Use   Vaping status: Never Used  Substance and Sexual Activity   Alcohol use: Yes    Alcohol/week: 1.0 standard drink of alcohol    Types: 1 Glasses of wine per week    Comment:  wine on the weekends   Drug use: No   Sexual activity: Yes  Other Topics Concern   Not on file  Social History Narrative   A friend supports. Not on food stamps. Pretty well taken care of by friend.    Social Drivers of Corporate investment banker Strain: Low Risk  (03/31/2018)   Overall Financial Resource Strain (CARDIA)    Difficulty of Paying Living Expenses: Not very hard  Food Insecurity: No Food Insecurity (08/22/2022)   Hunger Vital Sign    Worried About Running Out of Food in the Last Year: Never true    Ran Out of Food in the Last Year: Never true  Transportation Needs: No Transportation Needs (08/22/2022)   PRAPARE - Administrator, Civil Service (Medical): No    Lack of Transportation (Non-Medical): No  Physical Activity: Unknown (03/31/2018)   Exercise Vital Sign    Days of Exercise per Week: 0 days    Minutes of Exercise per Session: Not on file  Stress: No Stress Concern Present (03/31/2018)   [employer] of Occupational Health - Occupational Stress Questionnaire    Feeling of Stress : Not at all  Social Connections: Socially Integrated (03/31/2018)   Social Connection and Isolation Panel [NHANES]    Frequency of Communication with Friends and Family: More than three times a week    Frequency of Social Gatherings with  Friends and Family: Once a week    Attends Religious Services: More than 4 times per year    Active Member of [employer] or Organizations: Yes    Attends Banker Meetings: More than 4 times per year    Marital Status: Living with partner  Intimate Partner Violence: Not At Risk (10/14/2023)   Humiliation, Afraid, Rape, and Kick questionnaire    Fear of Current or Ex-Partner: No    Emotionally Abused: No    Physically Abused: No    Sexually Abused: No    FAMILY HISTORY: Family History  Problem Relation Age of Onset   Diabetes type II Mother    Diabetes type II Father    Heart disease Father    Cancer Maternal Aunt  Breast cancer Cousin     ALLERGIES:  has no known allergies.  MEDICATIONS:  Current Outpatient Medications  Medication Sig Dispense Refill   amLODipine (NORVASC) 5 MG tablet Take 1 tablet (5 mg total) by mouth daily. 90 tablet 1   Ascorbic Acid (VITAMIN C) 1000 MG tablet Take 1,000 mg by mouth daily.     aspirin EC (ASPIRIN LOW DOSE) 81 MG tablet Take 1 tablet (81 mg total) by mouth daily. Swallow whole. 90 tablet 0   atorvastatin (LIPITOR) 40 MG tablet Take 2 tablets (80 mg total) by mouth daily. 90 tablet 1   clopidogrel (PLAVIX) 75 MG tablet Take 1 tablet (75 mg total) by mouth daily. 90 tablet 1   famotidine (PEPCID) 20 MG tablet Take 1 tablet (20 mg total) by mouth 2 (two) times daily as needed for heartburn or indigestion. 180 tablet 1   glipiZIDE (GLUCOTROL) 10 MG tablet Take 2 tablets (20 mg total) by mouth daily before breakfast. 180 tablet 1   isosorbide mononitrate (IMDUR) 30 MG 24 hr tablet Take 1 tablet (30 mg total) by mouth daily. 90 tablet 1   lisinopril (ZESTRIL) 40 MG tablet Take 1 tablet (40 mg total) by mouth daily in the morning. 90 tablet 1   metFORMIN (GLUCOPHAGE) 1000 MG tablet Take 1.5 tablets (1,500 mg total) by mouth daily with breakfast. 270 tablet 0   metoprolol succinate (TOPROL-XL) 100 MG 24 hr tablet Take 2 tablets (200 mg  total) by mouth daily. Take with or immediately following a meal. 180 tablet 1   No current facility-administered medications for this visit.    Review of Systems  Constitutional:  Negative for appetite change, chills, fatigue, fever and unexpected weight change.  HENT:   Negative for hearing loss and voice change.   Eyes:  Negative for eye problems.  Respiratory:  Negative for chest tightness and cough.   Cardiovascular:  Negative for chest pain.  Gastrointestinal:  Negative for abdominal distention, abdominal pain and blood in stool.  Endocrine: Negative for hot flashes.  Genitourinary:  Negative for difficulty urinating and frequency.   Musculoskeletal:  Negative for arthralgias.  Skin:  Negative for itching and rash.  Neurological:  Negative for extremity weakness.  Hematological:  Negative for adenopathy.  Psychiatric/Behavioral:  Negative for confusion.     PHYSICAL EXAMINATION: ECOG PERFORMANCE STATUS: 0 - Asymptomatic Vitals:   10/14/23 0930  BP: (!) 148/70  Pulse: 67  Resp: 18  Temp: (!) 96.2 F (35.7 C)  SpO2: 100%   Filed Weights   10/14/23 0930  Weight: 152 lb 9.6 oz (69.2 kg)    Physical Exam Constitutional:      General: She is not in acute distress. HENT:     Head: Normocephalic and atraumatic.  Eyes:     General: No scleral icterus. Cardiovascular:     Rate and Rhythm: Normal rate and regular rhythm.     Heart sounds: Murmur heard.  Pulmonary:     Effort: Pulmonary effort is normal. No respiratory distress.     Breath sounds: No wheezing.  Abdominal:     General: Bowel sounds are normal. There is no distension.     Palpations: Abdomen is soft.  Musculoskeletal:        General: No deformity. Normal range of motion.     Cervical back: Normal range of motion and neck supple.  Skin:    General: Skin is warm and dry.     Findings: No erythema or rash.  Neurological:  Mental Status: She is alert and oriented to person, place, and time. Mental  status is at baseline.     Cranial Nerves: No cranial nerve deficit.     Coordination: Coordination normal.  Psychiatric:     Comments: Flat effect        RADIOGRAPHIC STUDIES: I have personally reviewed the radiological images as listed and agreed with the findings in the report. No results found.  LABORATORY DATA:  I have reviewed the data as listed    Latest Ref Rng & Units 10/14/2023   10:06 AM 09/11/2023    7:13 PM 06/12/2023    7:14 PM  CBC  WBC 4.0 - 10.5 K/uL 18.3  15.6  15.3   Hemoglobin 12.0 - 15.0 g/dL 16.1  09.6  04.5   Hematocrit 36.0 - 46.0 % 42.6  42.2  41.2   Platelets 150 - 400 K/uL 313  298  287       Latest Ref Rng & Units 06/12/2023    7:14 PM 05/16/2022    7:24 PM 03/25/2022    3:13 AM  CMP  Glucose 70 - 99 mg/dL 409  811    BUN 8 - 27 mg/dL 17  21    Creatinine 9.14 - 1.00 mg/dL 7.82  9.56    Sodium 213 - 144 mmol/L 144  143    Potassium 3.5 - 5.2 mmol/L 4.3  4.2  3.7   Chloride 96 - 106 mmol/L 107  108    CO2 20 - 29 mmol/L 24  23    Calcium 8.7 - 10.3 mg/dL 9.7  9.7    Total Protein 6.0 - 8.5 g/dL 6.8     Total Bilirubin 0.0 - 1.2 mg/dL 0.3     Alkaline Phos 44 - 121 IU/L 57     AST 0 - 40 IU/L 14     ALT 0 - 32 IU/L 14

## 2023-10-14 NOTE — Assessment & Plan Note (Signed)
Elevated Butrum blood cell count since 2012. No symptoms of infection, inflammation, or malignancy. Patient is a smoker, which could contribute to leukocytosis. labs reviewed and discussed with patient that Leukocytosis, differential diagnosis is broad, acute or chronic infection and inflammation, smoking, autoimmune disease, or underlying bone marrow disorders.   For the work up of patient's leukocytosis, I recommend checking CBC;CMP, LDH, smear review, peripheral flowcytometry, hepatitis, HIV, monoclonal gammopathy workup.

## 2023-10-14 NOTE — Assessment & Plan Note (Signed)
Recommend smoke cessation.  

## 2023-10-18 LAB — COMP PANEL: LEUKEMIA/LYMPHOMA

## 2023-10-23 LAB — PROTEIN ELECTROPHORESIS, SERUM
A/G Ratio: 1.2 (ref 0.7–1.7)
Albumin ELP: 4 g/dL (ref 2.9–4.4)
Alpha-1-Globulin: 0.2 g/dL (ref 0.0–0.4)
Alpha-2-Globulin: 0.9 g/dL (ref 0.4–1.0)
Beta Globulin: 1.1 g/dL (ref 0.7–1.3)
Gamma Globulin: 1 g/dL (ref 0.4–1.8)
Globulin, Total: 3.3 g/dL (ref 2.2–3.9)
Total Protein ELP: 7.3 g/dL (ref 6.0–8.5)

## 2023-11-03 ENCOUNTER — Telehealth: Payer: Self-pay

## 2023-11-03 DIAGNOSIS — R768 Other specified abnormal immunological findings in serum: Secondary | ICD-10-CM

## 2023-11-03 NOTE — Telephone Encounter (Signed)
-----   Message from Rickard Patience sent at 11/02/2023  2:21 PM EST ----- Hep B core IgM positive, possible active hep B infection.  Please refer her to GI for evaluation.  Other work up is negative.  Please send result to PCP as well thanks.

## 2023-11-03 NOTE — Telephone Encounter (Signed)
 Called pt and informed her of labs and MD recommendation. Pt verbalized understanding.

## 2023-11-03 NOTE — Telephone Encounter (Signed)
 Results [facility]

## 2023-11-04 ENCOUNTER — Telehealth: Payer: Self-pay

## 2023-11-04 NOTE — Telephone Encounter (Signed)
 Called pt to inform pt a GI referral was placed. Explained they will cal to schedule and if they do not within 2 weeks to call us back. Pt verbalized understanding,

## 2023-11-26 ENCOUNTER — Other Ambulatory Visit: Payer: Self-pay

## 2023-11-27 ENCOUNTER — Other Ambulatory Visit: Payer: Self-pay

## 2023-11-28 ENCOUNTER — Other Ambulatory Visit: Payer: Self-pay

## 2023-12-11 ENCOUNTER — Ambulatory Visit: Payer: Self-pay | Admitting: Gerontology

## 2023-12-11 VITALS — BP 129/79 | HR 79 | Ht 66.0 in | Wt 155.0 lb

## 2023-12-11 DIAGNOSIS — R768 Other specified abnormal immunological findings in serum: Secondary | ICD-10-CM | POA: Insufficient documentation

## 2023-12-11 DIAGNOSIS — E782 Mixed hyperlipidemia: Secondary | ICD-10-CM

## 2023-12-11 DIAGNOSIS — E119 Type 2 diabetes mellitus without complications: Secondary | ICD-10-CM

## 2023-12-11 DIAGNOSIS — E785 Hyperlipidemia, unspecified: Secondary | ICD-10-CM

## 2023-12-11 LAB — POCT GLYCOSYLATED HEMOGLOBIN (HGB A1C): Hemoglobin A1C: 7.6 % — AB (ref 4.0–5.6)

## 2023-12-11 LAB — GLUCOSE, POCT (MANUAL RESULT ENTRY): POC Glucose: 104 mg/dL — AB (ref 70–99)

## 2023-12-11 MED ORDER — ATORVASTATIN CALCIUM 40 MG PO TABS
80.0000 mg | ORAL_TABLET | Freq: Every day | ORAL | 1 refills | Status: DC
  Filled 2023-12-11 – 2024-04-19 (×2): qty 90, 45d supply, fill #0
  Filled 2024-07-02: qty 90, 45d supply, fill #1

## 2023-12-11 MED ORDER — METFORMIN HCL 1000 MG PO TABS
1500.0000 mg | ORAL_TABLET | Freq: Every day | ORAL | 0 refills | Status: DC
Start: 2023-12-11 — End: 2024-03-11
  Filled 2023-12-11: qty 270, 180d supply, fill #0

## 2023-12-11 NOTE — Progress Notes (Signed)
Established Patient Office Visit  Subjective   Patient ID: Laurie Kirby, female    DOB: [birth year]  Age: 63 y.o. MRN: [id number]  Chief Complaint  Patient presents with   Hypertension    HPI  Laurie Kirby is a 63 y/o female with a  history of Diabetes, Hyperlipidemia, Hypertension presents for follow up, lab review and medication refill.  Her hemoglobin A1c checked during visit increased from 6.6% to 7.6%, and her blood glucose was 104 mg per DL.  She states that she is compliant with her medications, and continues to make healthy lifestyle changes.  She denies hypo-/hyperglycemic symptoms, peripheral neuropathy and she performs daily foot checks. She was seen by Oncologist 10/14/23 by Dr Rickard Patience for evaluation of leukocytosis, and  result on 11/03/23 was positive Hep B core IgM positive, possible active hep B infection.  Please refer her to GI for evaluation.  She denies right upper quadrant abdominal pain, and jaundice.  Overall, she states that she is doing well and offers no further complaints.  Review of Systems  Constitutional: Negative.   Eyes: Negative.   Respiratory: Negative.    Cardiovascular: Negative.   Gastrointestinal: Negative.   Skin: Negative.   Neurological: Negative.   Endo/Heme/Allergies: Negative.   Psychiatric/Behavioral: Negative.        Objective:     BP 129/79 (BP Location: Right Arm, Patient Position: Sitting, Cuff Size: Normal)   Pulse 79   Ht 5\' 6"  (1.676 m)   Wt 155 lb (70.3 kg)   SpO2 98%   BMI 25.02 kg/m  BP Readings from Last 3 Encounters:  12/11/23 129/79  10/14/23 (!) 148/70  09/11/23 136/76   Wt Readings from Last 3 Encounters:  12/11/23 155 lb (70.3 kg)  10/14/23 152 lb 9.6 oz (69.2 kg)  09/11/23 154 lb (69.9 kg)      Physical Exam HENT:     Head: Normocephalic and atraumatic.     Mouth/Throat:     Mouth: Mucous membranes are moist.  Eyes:     Extraocular Movements: Extraocular movements intact.     Pupils: Pupils  are equal, round, and reactive to light.  Cardiovascular:     Rate and Rhythm: Normal rate and regular rhythm.     Pulses: Normal pulses.     Heart sounds: Normal heart sounds.  Pulmonary:     Effort: Pulmonary effort is normal.     Breath sounds: Normal breath sounds.  Abdominal:     General: Bowel sounds are normal.     Palpations: Abdomen is soft.     Tenderness: There is no abdominal tenderness. There is no guarding.  Skin:    General: Skin is warm.     Capillary Refill: Capillary refill takes less than 2 seconds.     Coloration: Skin is not jaundiced.  Neurological:     General: No focal deficit present.     Mental Status: She is alert and oriented to person, place, and time.  Psychiatric:        Mood and Affect: Mood normal.        Behavior: Behavior normal.      Results for orders placed or performed in visit on 12/11/23  POCT HgB A1C  Result Value Ref Range   Hemoglobin A1C 7.6 (A) 4.0 - 5.6 %   HbA1c POC (<> result, manual entry)     HbA1c, POC (prediabetic range)     HbA1c, POC (controlled diabetic range)    POCT Glucose (  CBG)  Result Value Ref Range   POC Glucose 104 (A) 70 - 99 mg/dl    Last CBC Lab Results  Component Value Date   WBC 18.3 (H) 10/14/2023   HGB 13.9 10/14/2023   HCT 42.6 10/14/2023   MCV 92.0 10/14/2023   MCH 30.0 10/14/2023   RDW 12.6 10/14/2023   PLT 313 10/14/2023   Last metabolic panel Lab Results  Component Value Date   GLUCOSE 154 (H) 06/12/2023   NA 144 06/12/2023   K 4.3 06/12/2023   CL 107 (H) 06/12/2023   CO2 24 06/12/2023   BUN 17 06/12/2023   CREATININE 0.90 06/12/2023   EGFR 73 06/12/2023   CALCIUM 9.7 06/12/2023   PROT 6.8 06/12/2023   ALBUMIN 4.2 06/12/2023   LABGLOB 3.3 10/14/2023   AGRATIO 1.2 10/14/2023   BILITOT 0.3 06/12/2023   ALKPHOS 57 06/12/2023   AST 14 06/12/2023   ALT 14 06/12/2023   ANIONGAP 10 03/22/2022   Last lipids Lab Results  Component Value Date   CHOL 166 06/12/2023   HDL 50  06/12/2023   LDLCALC 95 06/12/2023   TRIG 115 06/12/2023   CHOLHDL 3.3 06/12/2023   Last hemoglobin A1c Lab Results  Component Value Date   HGBA1C 7.6 (A) 12/11/2023   Last thyroid functions Lab Results  Component Value Date   TSH 1.720 01/11/2021   Last vitamin D No results found for: "25OHVITD2", "25OHVITD3", "VD25OH" Last vitamin B12 and Folate No results found for: "VITAMINB12", "FOLATE"    The ASCVD Risk score (Arnett DK, et al., 2019) failed to calculate for the following reasons:   Risk score cannot be calculated because patient has a medical history suggesting prior/existing ASCVD    Assessment & Plan:    1. Hyperlipidemia, unspecified hyperlipidemia type -She will continue current medication, low-fat/cholesterol diet and exercise as tolerated. - atorvastatin (LIPITOR) 40 MG tablet; Take 2 tablets (80 mg total) by mouth daily.  Dispense: 90 tablet; Refill: 1   2. Positive hepatitis test -She was advised to complete [employer] application for referral to gastroenterology for the evaluation of positive hepatitis results. - Ambulatory referral to Gastroenterology  3. Type 2 diabetes mellitus without complication, without long-term current use of insulin (HCC) -Her hemoglobin A1c was 7.6%, was encouraged to check blood glucose twice daily, record and bring log to follow-up appointment.  She will continue current medication, low carbohydrate/non concentrated sweet diet and exercise as tolerated. - metFORMIN (GLUCOPHAGE) 1000 MG tablet; Take 1.5 tablets (1,500 mg total) by mouth daily with breakfast.  Dispense: 270 tablet; Refill: 0   Return in about 13 weeks (around 03/11/2024), or if symptoms worsen or fail to improve.    Laurie Azizi Trellis Paganini, NP

## 2023-12-11 NOTE — Patient Instructions (Signed)

## 2023-12-12 ENCOUNTER — Other Ambulatory Visit: Payer: Self-pay

## 2024-03-11 ENCOUNTER — Ambulatory Visit: Payer: Self-pay | Admitting: Gerontology

## 2024-03-11 VITALS — BP 123/71 | HR 68 | Wt 155.9 lb

## 2024-03-11 DIAGNOSIS — K219 Gastro-esophageal reflux disease without esophagitis: Secondary | ICD-10-CM

## 2024-03-11 DIAGNOSIS — R768 Other specified abnormal immunological findings in serum: Secondary | ICD-10-CM

## 2024-03-11 DIAGNOSIS — E119 Type 2 diabetes mellitus without complications: Secondary | ICD-10-CM

## 2024-03-11 DIAGNOSIS — I10 Essential (primary) hypertension: Secondary | ICD-10-CM

## 2024-03-11 DIAGNOSIS — Z0000 Encounter for general adult medical examination without abnormal findings: Secondary | ICD-10-CM

## 2024-03-11 DIAGNOSIS — I25119 Atherosclerotic heart disease of native coronary artery with unspecified angina pectoris: Secondary | ICD-10-CM

## 2024-03-11 LAB — GLUCOSE, POCT (MANUAL RESULT ENTRY): POC Glucose: 166 mg/dL — AB (ref 70–99)

## 2024-03-11 MED ORDER — GLIPIZIDE 10 MG PO TABS
20.0000 mg | ORAL_TABLET | Freq: Every day | ORAL | 1 refills | Status: DC
  Filled 2024-03-11: qty 180, 90d supply, fill #0
  Filled 2024-07-02 (×2): qty 180, 90d supply, fill #1

## 2024-03-11 MED ORDER — LISINOPRIL 40 MG PO TABS
40.0000 mg | ORAL_TABLET | Freq: Every day | ORAL | 1 refills | Status: DC
  Filled 2024-03-11: qty 90, 90d supply, fill #0
  Filled 2024-07-02: qty 90, 90d supply, fill #1

## 2024-03-11 MED ORDER — AMLODIPINE BESYLATE 5 MG PO TABS
5.0000 mg | ORAL_TABLET | Freq: Every day | ORAL | 1 refills | Status: DC
  Filled 2024-03-11: qty 90, 90d supply, fill #0
  Filled 2024-07-02 (×2): qty 90, 90d supply, fill #1

## 2024-03-11 MED ORDER — METOPROLOL SUCCINATE ER 100 MG PO TB24
200.0000 mg | ORAL_TABLET | Freq: Every day | ORAL | 1 refills | Status: DC
  Filled 2024-03-11: qty 180, 90d supply, fill #0
  Filled 2024-07-02: qty 180, 90d supply, fill #1

## 2024-03-11 MED ORDER — METFORMIN HCL 1000 MG PO TABS
1500.0000 mg | ORAL_TABLET | Freq: Every day | ORAL | 0 refills | Status: DC
Start: 2024-03-11 — End: 2024-08-12
  Filled 2024-03-11: qty 270, 180d supply, fill #0
  Filled 2024-07-02: qty 135, 90d supply, fill #0
  Filled 2024-07-02: qty 270, 180d supply, fill #0

## 2024-03-11 MED ORDER — ISOSORBIDE MONONITRATE ER 30 MG PO TB24
30.0000 mg | ORAL_TABLET | Freq: Every day | ORAL | 1 refills | Status: DC
  Filled 2024-03-11: qty 90, 90d supply, fill #0
  Filled 2024-07-02: qty 50, 50d supply, fill #1

## 2024-03-11 MED ORDER — FAMOTIDINE 20 MG PO TABS
20.0000 mg | ORAL_TABLET | Freq: Two times a day (BID) | ORAL | 1 refills | Status: DC | PRN
  Filled 2024-03-11: qty 180, 90d supply, fill #0
  Filled 2024-07-02 (×2): qty 180, 90d supply, fill #1

## 2024-03-11 MED ORDER — CLOPIDOGREL BISULFATE 75 MG PO TABS
75.0000 mg | ORAL_TABLET | Freq: Every day | ORAL | 1 refills | Status: DC
  Filled 2024-03-11: qty 90, 90d supply, fill #0
  Filled 2024-07-02 (×2): qty 90, 90d supply, fill #1

## 2024-03-11 NOTE — Progress Notes (Signed)
 Established Patient Office Visit  Subjective   Patient ID: Laurie Kirby, female    DOB: [birth year]  Age: 63 y.o. MRN: [id number]  Chief Complaint  Patient presents with   Follow-up    HPI  Laurie Kirby is a 63 y/o female with a history of Diabetes, Hyperlipidemia, Hypertension presents for follow up visit and medication refill.  She states that she is compliant with her medication, denies side effects and continues to make healthy lifestyle changes.  Clinic was unable to obtain her hemoglobin A1c but blood glucose was 166 mg per DL.  She states that she is not checking her blood glucose because her glucometer broke.  She denies hypo-/hyperglycemic symptoms, peripheral neuropathy and performs daily foot checks.  She states that she has not been contacted by gastroenterology for her positive hep B core IgM. Overall, she states that she's doing well and offers no further complaints.     Review of Systems  Constitutional: Negative.   HENT: Negative.    Eyes: Negative.   Respiratory: Negative.    Cardiovascular: Negative.   Gastrointestinal: Negative.   Genitourinary: Negative.   Musculoskeletal: Negative.   Skin: Negative.   Neurological: Negative.   Endo/Heme/Allergies: Negative.   Psychiatric/Behavioral: Negative.        Objective:     BP 123/71 (BP Location: Right Arm)   Pulse 68   Wt 155 lb 14.4 oz (70.7 kg)   SpO2 93%   BMI 25.16 kg/m  BP Readings from Last 3 Encounters:  03/11/24 123/71  12/11/23 129/79  10/14/23 (!) 148/70   Wt Readings from Last 3 Encounters:  03/11/24 155 lb 14.4 oz (70.7 kg)  12/11/23 155 lb (70.3 kg)  10/14/23 152 lb 9.6 oz (69.2 kg)   SpO2 Readings from Last 3 Encounters:  03/11/24 93%  12/11/23 98%  10/14/23 100%      Physical Exam HENT:     Head: Normocephalic and atraumatic.     Mouth/Throat:     Mouth: Mucous membranes are moist.     Pharynx: Oropharynx is clear.  Eyes:     Extraocular Movements: Extraocular  movements intact.     Conjunctiva/sclera: Conjunctivae normal.     Pupils: Pupils are equal, round, and reactive to light.  Cardiovascular:     Rate and Rhythm: Normal rate and regular rhythm.     Pulses: Normal pulses.     Heart sounds: Normal heart sounds.  Pulmonary:     Effort: Pulmonary effort is normal.     Breath sounds: Normal breath sounds.  Skin:    General: Skin is warm and dry.     Capillary Refill: Capillary refill takes less than 2 seconds.  Neurological:     General: No focal deficit present.     Mental Status: She is alert and oriented to person, place, and time.  Psychiatric:        Mood and Affect: Mood normal.        Behavior: Behavior normal.        Thought Content: Thought content normal.        Judgment: Judgment normal.      Results for orders placed or performed in visit on 03/11/24  POCT Glucose (CBG)  Result Value Ref Range   POC Glucose 166 (A) 70 - 99 mg/dl    Last CBC Lab Results  Component Value Date   WBC 18.3 (H) 10/14/2023   HGB 13.9 10/14/2023   HCT 42.6 10/14/2023   MCV 92.0  10/14/2023   MCH 30.0 10/14/2023   RDW 12.6 10/14/2023   PLT 313 10/14/2023   Last metabolic panel Lab Results  Component Value Date   GLUCOSE 154 (H) 06/12/2023   NA 144 06/12/2023   K 4.3 06/12/2023   CL 107 (H) 06/12/2023   CO2 24 06/12/2023   BUN 17 06/12/2023   CREATININE 0.90 06/12/2023   EGFR 73 06/12/2023   CALCIUM  9.7 06/12/2023   PROT 6.8 06/12/2023   ALBUMIN 4.2 06/12/2023   LABGLOB 3.3 10/14/2023   AGRATIO 1.2 10/14/2023   BILITOT 0.3 06/12/2023   ALKPHOS 57 06/12/2023   AST 14 06/12/2023   ALT 14 06/12/2023   ANIONGAP 10 03/22/2022   Last lipids Lab Results  Component Value Date   CHOL 166 06/12/2023   HDL 50 06/12/2023   LDLCALC 95 06/12/2023   TRIG 115 06/12/2023   CHOLHDL 3.3 06/12/2023   Last hemoglobin A1c Lab Results  Component Value Date   HGBA1C 7.6 (A) 12/11/2023   Last thyroid functions Lab Results  Component  Value Date   TSH 1.720 01/11/2021   Last vitamin D No results found for: "25OHVITD2", "25OHVITD3", "VD25OH" Last vitamin B12 and Folate No results found for: "VITAMINB12", "FOLATE"    The ASCVD Risk score (Arnett DK, et al., 2019) failed to calculate for the following reasons:   Risk score cannot be calculated because patient has a medical history suggesting prior/existing ASCVD    Assessment & Plan:  .Aaron Aas1. Diabetes mellitus without complication (HCC) (Primary) - Encourage frequently monitor  glucose levels at home. - encourage low carb diet.  - She will continue on current medication plan   - POCT Glucose (CBG); Future - POCT Glucose (CBG) - glipiZIDE  (GLUCOTROL ) 10 MG tablet; Take 2 tablets (20 mg total) by mouth daily before breakfast.  Dispense: 180 tablet; Refill: 1  2. Health care maintenance - encourage continue with  treatment  plan as prescribed. - CBC with Differential/Platelet; Future - Comp Met (CMET); Future - HgB A1c; Future - Lipid panel; Future - Microalbumin / creatinine urine ratio; Future  3. Essential hypertension - She encourage to monitor  her blood pressure daily .  - Monitor for symptoms of  dizziness, headache,  and vision  changes. - Encourage maintain BP below 130/80 - encourage to continue medication as prescribed. - amLODipine  (NORVASC ) 5 MG tablet; Take 1 tablet (5 mg total) by mouth daily.  Dispense: 90 tablet; Refill: 1 - lisinopril  (ZESTRIL ) 40 MG tablet; Take 1 tablet (40 mg total) by mouth daily in the morning.  Dispense: 90 tablet; Refill: 1 - metoprolol  succinate (TOPROL -XL) 100 MG 24 hr tablet; Take 2 tablets (200 mg total) by mouth daily. Take with or immediately following a meal.  Dispense: 180 tablet; Refill: 1  4. 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 - encourage to  maintain high  activity levels - encourage  appropriate diet. - encourage take regularly her medications - clopidogrel  (PLAVIX ) 75 MG tablet; Take 1 tablet (75 mg total) by mouth daily.  Dispense: 90 tablet; Refill: 1 - isosorbide  mononitrate (IMDUR ) 30 MG 24 hr tablet; Take 1 tablet (30 mg total) by mouth daily.  Dispense: 90 tablet; Refill: 1  5. Gastroesophageal reflux disease without esophagitis.  - encourage monitor symptoms of heartburn, regurgitation,  chest discomfort. - continue daily medication as prescribed. - encourage  avoid spicy, acidic food. - famotidine  (PEPCID ) 20 MG tablet; Take 1 tablet (20 mg total) by mouth 2 (two) times daily as needed for heartburn or indigestion.  Dispense: 180 tablet; Refill: 1        Return in about 7 weeks (around 04/29/2024), or if symptoms worsen or fail to improve.    Sheela Denmark, RN

## 2024-03-12 ENCOUNTER — Other Ambulatory Visit: Payer: Self-pay

## 2024-04-06 ENCOUNTER — Other Ambulatory Visit: Payer: Self-pay

## 2024-04-13 ENCOUNTER — Ambulatory Visit: Payer: Self-pay | Admitting: Gerontology

## 2024-04-14 ENCOUNTER — Telehealth: Payer: Self-pay

## 2024-04-14 NOTE — Telephone Encounter (Signed)
 GI referral faxed to [facility]. [id number] hepatology serum. Fax confirmation received.

## 2024-04-19 ENCOUNTER — Other Ambulatory Visit: Payer: Self-pay

## 2024-04-19 ENCOUNTER — Other Ambulatory Visit: Payer: Self-pay | Admitting: Gerontology

## 2024-04-19 DIAGNOSIS — I25119 Atherosclerotic heart disease of native coronary artery with unspecified angina pectoris: Secondary | ICD-10-CM

## 2024-04-20 ENCOUNTER — Other Ambulatory Visit: Payer: Self-pay

## 2024-04-20 MED FILL — Aspirin Tab Delayed Release 81 MG: ORAL | 90 days supply | Qty: 90 | Fill #0 | Status: AC

## 2024-04-22 ENCOUNTER — Other Ambulatory Visit: Payer: Self-pay

## 2024-04-22 DIAGNOSIS — Z0000 Encounter for general adult medical examination without abnormal findings: Secondary | ICD-10-CM

## 2024-04-23 LAB — LIPID PANEL
Chol/HDL Ratio: 3.1 ratio (ref 0.0–4.4)
Cholesterol, Total: 172 mg/dL (ref 100–199)
HDL: 55 mg/dL (ref >39)
LDL Chol Calc (NIH): 100 mg/dL — ABNORMAL HIGH (ref 0–99)
Triglycerides: 92 mg/dL (ref 0–149)
VLDL Cholesterol Cal: 17 mg/dL (ref 5–40)

## 2024-04-23 LAB — CBC WITH DIFFERENTIAL/PLATELET
Basophils Absolute: 0.1 10*3/uL (ref 0.0–0.2)
Basos: 0 %
EOS (ABSOLUTE): 0.3 10*3/uL (ref 0.0–0.4)
Eos: 2 %
Hematocrit: 41.9 % (ref 34.0–46.6)
Hemoglobin: 13.6 g/dL (ref 11.1–15.9)
Immature Grans (Abs): 0 10*3/uL (ref 0.0–0.1)
Immature Granulocytes: 0 %
Lymphocytes Absolute: 4.6 10*3/uL — ABNORMAL HIGH (ref 0.7–3.1)
Lymphs: 32 %
MCH: 30.2 pg (ref 26.6–33.0)
MCHC: 32.5 g/dL (ref 31.5–35.7)
MCV: 93 fL (ref 79–97)
Monocytes Absolute: 0.6 10*3/uL (ref 0.1–0.9)
Monocytes: 4 %
Neutrophils Absolute: 8.7 10*3/uL — ABNORMAL HIGH (ref 1.4–7.0)
Neutrophils: 62 %
Platelets: 263 10*3/uL (ref 150–450)
RBC: 4.51 x10E6/uL (ref 3.77–5.28)
RDW: 12.6 % (ref 11.7–15.4)
WBC: 14.2 10*3/uL — ABNORMAL HIGH (ref 3.4–10.8)

## 2024-04-23 LAB — COMPREHENSIVE METABOLIC PANEL WITH GFR
ALT: 15 IU/L (ref 0–32)
AST: 12 IU/L (ref 0–40)
Albumin: 4.5 g/dL (ref 3.9–4.9)
Alkaline Phosphatase: 60 IU/L (ref 44–121)
BUN/Creatinine Ratio: 21 (ref 12–28)
BUN: 16 mg/dL (ref 8–27)
Bilirubin Total: 0.4 mg/dL (ref 0.0–1.2)
CO2: 21 mmol/L (ref 20–29)
Calcium: 10.1 mg/dL (ref 8.7–10.3)
Chloride: 108 mmol/L — ABNORMAL HIGH (ref 96–106)
Creatinine, Ser: 0.75 mg/dL (ref 0.57–1.00)
Globulin, Total: 3 g/dL (ref 1.5–4.5)
Glucose: 105 mg/dL — ABNORMAL HIGH (ref 70–99)
Potassium: 4.2 mmol/L (ref 3.5–5.2)
Sodium: 143 mmol/L (ref 134–144)
Total Protein: 7.5 g/dL (ref 6.0–8.5)
eGFR: 91 mL/min/{1.73_m2} (ref >59)

## 2024-04-23 LAB — HEMOGLOBIN A1C
Est. average glucose Bld gHb Est-mCnc: 171 mg/dL
Hgb A1c MFr Bld: 7.6 % — ABNORMAL HIGH (ref 4.8–5.6)

## 2024-04-23 LAB — MICROALBUMIN / CREATININE URINE RATIO
Creatinine, Urine: 210.7 mg/dL
Microalb/Creat Ratio: 164 mg/g{creat} — ABNORMAL HIGH (ref 0–29)
Microalbumin, Urine: 345.6 ug/mL

## 2024-04-29 ENCOUNTER — Ambulatory Visit: Payer: Self-pay

## 2024-04-29 ENCOUNTER — Ambulatory Visit: Payer: Self-pay | Admitting: Gerontology

## 2024-05-06 ENCOUNTER — Ambulatory Visit: Payer: Self-pay | Admitting: Gerontology

## 2024-05-06 VITALS — BP 133/78 | HR 96 | Resp 16 | Ht 63.0 in | Wt 152.6 lb

## 2024-05-06 DIAGNOSIS — I10 Essential (primary) hypertension: Secondary | ICD-10-CM

## 2024-05-06 DIAGNOSIS — Z862 Personal history of diseases of the blood and blood-forming organs and certain disorders involving the immune mechanism: Secondary | ICD-10-CM

## 2024-05-06 DIAGNOSIS — E119 Type 2 diabetes mellitus without complications: Secondary | ICD-10-CM

## 2024-05-06 LAB — GLUCOSE, POCT (MANUAL RESULT ENTRY): POC Glucose: 110 mg/dL — AB (ref 70–99)

## 2024-05-06 MED ORDER — BLOOD GLUCOSE MONITOR SYSTEM W/DEVICE KIT
PACK | 0 refills | Status: AC
  Filled 2024-05-06: qty 1, 1d supply, fill #0

## 2024-05-06 NOTE — Progress Notes (Signed)
 Established Patient Office Visit  Subjective   Patient ID: Laurie Kirby, female    DOB: 01/21/1961  Age: 63 y.o. MRN: [id number]  No chief complaint on file.   HPI  [name] is a 63 y/o female with a history of Diabetes,Hyperlipidemia, Hypertension presents for follow up visit, lab review and medication refill.  She states that she is compliant with her medication, denies side effects and continues to make healthy lifestyle changes.  Had a hemoglobin A1c checked on 04/22/2024 was 7.6% and her blood glucose during visit was 110 mg per DL.  She is unable to monitor her glucose levels due to equipment problems.  She denies hypo-/hyper glycemic symptoms, peripheral neuropathy and perform daily foot checks.  Her WBC decreased from 18.3 to 14.2, she denies fever, chill and she continues to smoke, admits the desire to quit.  Her other labs was unremarkable.  Overall, she states that she is doing well and offers no further complaints.   Review of Systems  Constitutional: Negative.   Eyes: Negative.   Respiratory: Negative.    Cardiovascular: Negative.   Skin: Negative.   Neurological: Negative.   Endo/Heme/Allergies: Negative.   Psychiatric/Behavioral: Negative.        Objective:     BP 133/78 (BP Location: Right Arm, Patient Position: Sitting, Cuff Size: Normal)   Pulse 96   Resp 16   Ht 5' 3 (1.6 m)   Wt 152 lb 9.6 oz (69.2 kg)   SpO2 95%   BMI 27.03 kg/m  Wt Readings from Last 3 Encounters:  05/06/24 152 lb 9.6 oz (69.2 kg)  03/11/24 155 lb 14.4 oz (70.7 kg)  12/11/23 155 lb (70.3 kg)   SpO2 Readings from Last 3 Encounters:  05/06/24 95%  03/11/24 93%  12/11/23 98%      Physical Exam HENT:     Head: Normocephalic and atraumatic.     Mouth/Throat:     Mouth: Mucous membranes are moist.     Pharynx: Oropharynx is clear.  Eyes:     Extraocular Movements: Extraocular movements intact.     Conjunctiva/sclera: Conjunctivae normal.     Pupils: Pupils are equal,  round, and reactive to light.  Cardiovascular:     Rate and Rhythm: Normal rate and regular rhythm.     Pulses: Normal pulses.     Heart sounds: Normal heart sounds.  Pulmonary:     Effort: Pulmonary effort is normal.     Breath sounds: Normal breath sounds.  Skin:    General: Skin is warm and dry.     Capillary Refill: Capillary refill takes less than 2 seconds.  Neurological:     General: No focal deficit present.     Mental Status: She is alert and oriented to person, place, and time.  Psychiatric:        Mood and Affect: Mood normal.        Behavior: Behavior normal.        Thought Content: Thought content normal.        Judgment: Judgment normal.      Results for orders placed or performed in visit on 05/06/24  POCT Glucose (CBG)  Result Value Ref Range   POC Glucose 110 (A) 70 - 99 mg/dl    Last CBC Lab Results  Component Value Date   WBC 14.2 (H) 04/22/2024   HGB 13.6 04/22/2024   HCT 41.9 04/22/2024   MCV 93 04/22/2024   MCH 30.2 04/22/2024   RDW 12.6 04/22/2024  PLT 263 04/22/2024   Last metabolic panel Lab Results  Component Value Date   GLUCOSE 105 (H) 04/22/2024   NA 143 04/22/2024   K 4.2 04/22/2024   CL 108 (H) 04/22/2024   CO2 21 04/22/2024   BUN 16 04/22/2024   CREATININE 0.75 04/22/2024   EGFR 91 04/22/2024   CALCIUM  10.1 04/22/2024   PROT 7.5 04/22/2024   ALBUMIN 4.5 04/22/2024   LABGLOB 3.0 04/22/2024   AGRATIO 1.2 10/14/2023   BILITOT 0.4 04/22/2024   ALKPHOS 60 04/22/2024   AST 12 04/22/2024   ALT 15 04/22/2024   ANIONGAP 10 03/22/2022   Last lipids Lab Results  Component Value Date   CHOL 172 04/22/2024   HDL 55 04/22/2024   LDLCALC 100 (H) 04/22/2024   TRIG 92 04/22/2024   CHOLHDL 3.1 04/22/2024   Last hemoglobin A1c Lab Results  Component Value Date   HGBA1C 7.6 (H) 04/22/2024    .SABRAThe ASCVD Risk score (Arnett DK, et al., 2019) failed to calculate for the following reasons:   Risk score cannot be calculated because  patient has a medical history suggesting prior/existing ASCVD     Assessment & Plan:  .[email or web address]. Diabetes mellitus without complication (HCC) (Primary) -Diabetes is improving, hemoglobin A1c was 7.6% goal should be less than 7%. - continue medication as prescribed - encourage to monitor glucose levels daily,record and bring log to follow up appointment. - advised to keep A1C < 7.0 - POCT Glucose (CBG); Future - POCT Glucose (CBG) - blood glucose meter kit and supplies KIT; Dispense based on patient and insurance preference. Use up to four times daily as directed.  Dispense: 1 each; Refill: 0  2. Essential hypertension -Blood pressure is improving and she will continue on current medication. - encourage to follow DASH  diet - encourage to monitor BP daily or at least twice a week. - continue medication as prescribed.    3. History of leukocytosis -WBC is decreasing and will continue to monitor. - encourage to keep appointment with oncology - notify office of any changes in health.       Return in about 14 weeks (around 08/12/2024), or if symptoms worsen or fail to improve, for F/U 08/12/24 in clinic.    [name] [name], NP

## 2024-05-06 NOTE — Patient Instructions (Signed)

## 2024-05-07 ENCOUNTER — Other Ambulatory Visit: Payer: Self-pay

## 2024-05-07 MED ORDER — GLUCOSE BLOOD VI STRP
ORAL_STRIP | 0 refills | Status: AC
  Filled 2024-05-07: qty 100, 25d supply, fill #0

## 2024-05-07 MED ORDER — TRUEPLUS LANCETS 33G MISC
1.0000 | Freq: Four times a day (QID) | 0 refills | Status: AC
Start: 2024-05-06 — End: ?
  Filled 2024-05-07: qty 100, 25d supply, fill #0

## 2024-05-18 ENCOUNTER — Other Ambulatory Visit: Payer: Self-pay

## 2024-06-01 NOTE — Telephone Encounter (Signed)
 Called [facility] to follow up on Referral and they said pt declined to establish care because she had never seen Dr. Babara.    Dr. Babara saw pt back in January and at that time referral was sent to [facility], but referral had to be re routed to [facility].

## 2024-07-02 ENCOUNTER — Other Ambulatory Visit: Payer: Self-pay | Admitting: Gerontology

## 2024-07-02 ENCOUNTER — Other Ambulatory Visit: Payer: Self-pay

## 2024-07-02 DIAGNOSIS — I25119 Atherosclerotic heart disease of native coronary artery with unspecified angina pectoris: Secondary | ICD-10-CM

## 2024-07-02 MED FILL — Aspirin Tab Delayed Release 81 MG: ORAL | 90 days supply | Qty: 90 | Fill #0 | Status: CN

## 2024-07-03 ENCOUNTER — Other Ambulatory Visit: Payer: Self-pay

## 2024-07-05 ENCOUNTER — Other Ambulatory Visit: Payer: Self-pay

## 2024-07-16 ENCOUNTER — Other Ambulatory Visit: Payer: Self-pay

## 2024-08-12 ENCOUNTER — Ambulatory Visit: Payer: Self-pay | Admitting: Gerontology

## 2024-08-12 VITALS — BP 136/67 | HR 73 | Temp 98.8°F | Ht 66.0 in | Wt 156.8 lb

## 2024-08-12 DIAGNOSIS — I10 Essential (primary) hypertension: Secondary | ICD-10-CM

## 2024-08-12 DIAGNOSIS — E119 Type 2 diabetes mellitus without complications: Secondary | ICD-10-CM

## 2024-08-12 DIAGNOSIS — I25119 Atherosclerotic heart disease of native coronary artery with unspecified angina pectoris: Secondary | ICD-10-CM

## 2024-08-12 DIAGNOSIS — K219 Gastro-esophageal reflux disease without esophagitis: Secondary | ICD-10-CM

## 2024-08-12 DIAGNOSIS — E785 Hyperlipidemia, unspecified: Secondary | ICD-10-CM

## 2024-08-12 LAB — POCT GLYCOSYLATED HEMOGLOBIN (HGB A1C): Hemoglobin A1C: 6.8 % — AB (ref 4.0–5.6)

## 2024-08-12 LAB — GLUCOSE, POCT (MANUAL RESULT ENTRY): POC Glucose: 149 mg/dL — AB (ref 70–99)

## 2024-08-12 MED ORDER — ASPIRIN 81 MG PO TBEC
81.0000 mg | DELAYED_RELEASE_TABLET | Freq: Every day | ORAL | 0 refills | Status: DC
  Filled 2024-08-12 – 2024-10-16 (×2): qty 90, 90d supply, fill #0

## 2024-08-12 MED ORDER — METOPROLOL SUCCINATE ER 100 MG PO TB24
200.0000 mg | ORAL_TABLET | Freq: Every day | ORAL | 1 refills | Status: DC
  Filled 2024-08-12 – 2024-10-16 (×2): qty 180, 90d supply, fill #0

## 2024-08-12 MED ORDER — LISINOPRIL 40 MG PO TABS
40.0000 mg | ORAL_TABLET | Freq: Every day | ORAL | 1 refills | Status: DC
  Filled 2024-08-12 – 2024-10-16 (×2): qty 90, 90d supply, fill #0

## 2024-08-12 MED ORDER — FAMOTIDINE 20 MG PO TABS
20.0000 mg | ORAL_TABLET | Freq: Two times a day (BID) | ORAL | 1 refills | Status: DC | PRN
  Filled 2024-08-12 – 2024-10-16 (×2): qty 180, 90d supply, fill #0

## 2024-08-12 MED ORDER — ATORVASTATIN CALCIUM 40 MG PO TABS
80.0000 mg | ORAL_TABLET | Freq: Every day | ORAL | 1 refills | Status: DC
  Filled 2024-08-12 – 2024-10-16 (×2): qty 90, 45d supply, fill #0

## 2024-08-12 MED ORDER — CLOPIDOGREL BISULFATE 75 MG PO TABS
75.0000 mg | ORAL_TABLET | Freq: Every day | ORAL | 1 refills | Status: DC
  Filled 2024-08-12 – 2024-10-16 (×2): qty 90, 90d supply, fill #0

## 2024-08-12 MED ORDER — ISOSORBIDE MONONITRATE ER 30 MG PO TB24
30.0000 mg | ORAL_TABLET | Freq: Every day | ORAL | 1 refills | Status: DC
  Filled 2024-08-12 – 2024-10-16 (×2): qty 90, 90d supply, fill #0

## 2024-08-12 MED ORDER — METFORMIN HCL 1000 MG PO TABS
1500.0000 mg | ORAL_TABLET | Freq: Every day | ORAL | 0 refills | Status: DC
  Filled 2024-08-12 – 2024-10-16 (×2): qty 270, 180d supply, fill #0

## 2024-08-12 MED ORDER — AMLODIPINE BESYLATE 5 MG PO TABS
5.0000 mg | ORAL_TABLET | Freq: Every day | ORAL | 1 refills | Status: DC
  Filled 2024-08-12 – 2024-10-16 (×2): qty 90, 90d supply, fill #0

## 2024-08-12 MED ORDER — GLIPIZIDE 10 MG PO TABS
20.0000 mg | ORAL_TABLET | Freq: Every day | ORAL | 1 refills | Status: DC
  Filled 2024-08-12 – 2024-10-16 (×2): qty 180, 90d supply, fill #0

## 2024-08-12 NOTE — Patient Instructions (Signed)
 DASH Eating Plan DASH stands for Dietary Approaches to Stop Hypertension. The DASH eating plan is a healthy eating plan that has been shown to: Lower high blood pressure (hypertension). Reduce your risk for type 2 diabetes, heart disease, and stroke. Help with weight loss. What are tips for following this plan? Reading food labels Check food labels for the amount of salt (sodium) per serving. Choose foods with less than 5 percent of the Daily Value (DV) of sodium. In general, foods with less than 300 milligrams (mg) of sodium per serving fit into this eating plan. To find whole grains, look for the word whole as the first word in the ingredient list. Shopping Buy products labeled as low-sodium or no salt added. Buy fresh foods. Avoid canned foods and pre-made or frozen meals. Cooking Try not to add salt when you cook. Use salt-free seasonings or herbs instead of table salt or sea salt. Check with your health care provider or pharmacist before using salt substitutes. Do not fry foods. Cook foods in healthy ways, such as baking, boiling, grilling, roasting, or broiling. Cook using oils that are good for your heart. These include olive, canola, avocado, soybean, and sunflower oil. Meal planning  Eat a balanced diet. This should include: 4 or more servings of fruits and 4 or more servings of vegetables each day. Try to fill half of your plate with fruits and vegetables. 6-8 servings of whole grains each day. 6 or less servings of lean meat, poultry, or fish each day. 1 oz is 1 serving. A 3 oz (85 g) serving of meat is about the same size as the palm of your hand. One egg is 1 oz (28 g). 2-3 servings of low-fat dairy each day. One serving is 1 cup (237 mL). 1 serving of nuts, seeds, or beans 5 times each week. 2-3 servings of heart-healthy fats. Healthy fats called omega-3 fatty acids are found in foods such as walnuts, flaxseeds, fortified milks, and eggs. These fats are also found in  cold-water fish, such as sardines, salmon, and mackerel. Limit how much you eat of: Canned or prepackaged foods. Food that is high in trans fat, such as fried foods. Food that is high in saturated fat, such as fatty meat. Desserts and other sweets, sugary drinks, and other foods with added sugar. Full-fat dairy products. Do not salt foods before eating. Do not eat more than 4 egg yolks a week. Try to eat at least 2 vegetarian meals a week. Eat more home-cooked food and less restaurant, buffet, and fast food. Lifestyle When eating at a restaurant, ask if your food can be made with less salt or no salt. If you drink alcohol: Limit how much you have to: 0-1 drink a day if you are female. 0-2 drinks a day if you are female. Know how much alcohol is in your drink. In the [place] one drink is one 12 oz bottle of beer (355 mL), one 5 oz glass of wine (148 mL), or one 1 oz glass of hard liquor (44 mL). General information Avoid eating more than 2,300 mg of salt a day. If you have hypertension, you may need to reduce your sodium intake to 1,500 mg a day. Work with your provider to stay at a healthy body weight or lose weight. Ask what the best weight range is for you. On most days of the week, get at least 30 minutes of exercise that causes your heart to beat faster. This may include walking, swimming, or  biking. Work with your provider or dietitian to adjust your eating plan to meet your specific calorie needs. What foods should I eat? Fruits All fresh, dried, or frozen fruit. Canned fruits that are in their natural juice and do not have sugar added to them. Vegetables Fresh or frozen vegetables that are raw, steamed, roasted, or grilled. Low-sodium or reduced-sodium tomato and vegetable juice. Low-sodium or reduced-sodium tomato sauce and tomato paste. Low-sodium or reduced-sodium canned vegetables. Grains Whole-grain or whole-wheat bread. Whole-grain or whole-wheat pasta. Brown rice. [place]. Bulgur. Whole-grain and low-sodium cereals. Pita bread. Low-fat, low-sodium crackers. Whole-wheat flour tortillas. Meats and other proteins Skinless chicken or [place]. Ground chicken or [place]. Pork with fat trimmed off. Fish and seafood. Egg whites. Dried beans, peas, or lentils. Unsalted nuts, nut butters, and seeds. Unsalted canned beans. Lean cuts of beef with fat trimmed off. Low-sodium, lean precooked or cured meat, such as sausages or meat loaves. Dairy Low-fat (1%) or fat-free (skim) milk. Reduced-fat, low-fat, or fat-free cheeses. Nonfat, low-sodium ricotta or cottage cheese. Low-fat or nonfat yogurt. Low-fat, low-sodium cheese. Fats and oils Soft margarine without trans fats. Vegetable oil. Reduced-fat, low-fat, or light mayonnaise and salad dressings (reduced-sodium). Canola, safflower, olive, avocado, soybean, and sunflower oils. Avocado. Seasonings and condiments Herbs. Spices. Seasoning mixes without salt. Other foods Unsalted popcorn and pretzels. Fat-free sweets. The items listed above may not be all the foods and drinks you can have. Talk to a dietitian to learn more. What foods should I avoid? Fruits Canned fruit in a light or heavy syrup. Fried fruit. Fruit in cream or butter sauce. Vegetables Creamed or fried vegetables. Vegetables in a cheese sauce. Regular canned vegetables that are not marked as low-sodium or reduced-sodium. Regular canned tomato sauce and paste that are not marked as low-sodium or reduced-sodium. Regular tomato and vegetable juices that are not marked as low-sodium or reduced-sodium. Dene. Olives. Grains Baked goods made with fat, such as croissants, muffins, or some breads. Dry pasta or rice meal packs. Meats and other proteins Fatty cuts of meat. Ribs. Fried meat. [place]. Bologna, salami, and other precooked or cured meats, such as sausages or meat loaves, that are not lean and low in sodium. Fat from the back of a pig (fatback). Bratwurst.  Salted nuts and seeds. Canned beans with added salt. Canned or smoked fish. Whole eggs or egg yolks. Chicken or [place] with skin. Dairy Whole or 2% milk, cream, and half-and-half. Whole or full-fat cream cheese. Whole-fat or sweetened yogurt. Full-fat cheese. Nondairy creamers. Whipped toppings. Processed cheese and cheese spreads. Fats and oils Butter. Stick margarine. Lard. Shortening. Ghee. Bacon fat. Tropical oils, such as coconut, palm kernel, or palm oil. Seasonings and condiments Onion salt, garlic salt, seasoned salt, table salt, and sea salt. Worcestershire sauce. Tartar sauce. Barbecue sauce. Teriyaki sauce. Soy sauce, including reduced-sodium soy sauce. Steak sauce. Canned and packaged gravies. Fish sauce. Oyster sauce. Cocktail sauce. Store-bought horseradish. Ketchup. Mustard. Meat flavorings and tenderizers. Bouillon cubes. Hot sauces. Pre-made or packaged marinades. Pre-made or packaged taco seasonings. Relishes. Regular salad dressings. Other foods Salted popcorn and pretzels. The items listed above may not be all the foods and drinks you should avoid. Talk to a dietitian to learn more. Where to find more information National Heart, Lung, and Blood Institute (NHLBI): BuffaloDryCleaner.gl American Heart Association (AHA): heart.org Academy of Nutrition and Dietetics: eatright.org National Kidney Foundation (NKF): kidney.org This information is not intended to replace advice given to you by your health care provider. Make sure  you discuss any questions you have with your health care provider. Document Revised: 10/31/2022 Document Reviewed: 10/31/2022 Elsevier Patient Education  2024 Else[employer] Inc.Diabetes: Carbohydrate Counting for Adults Carbohydrate counting is a method of keeping track of how many carbohydrates you eat. Eating carbohydrates increases the amount of sugar, also called glucose, in your blood. By counting how many carbohydrates you eat, you can improve how well you manage  your blood sugar. This, in turn, helps you manage your diabetes. Carbohydrates are measured in grams (g) per serving. It's important to know how many carbohydrates (in grams or by serving size) you can have in each meal. This is different for every person. A dietitian can help you make a meal plan and calculate how many carbohydrates you should have at each meal and snack. What foods contain carbohydrates? Carbohydrates are found in these foods: Grains, such as breads and cereals. Dried beans and soy products. Starchy vegetables, such as potatoes, peas, and corn. Fruit and fruit juices. Milk and yogurt. Sweets and snack foods like cake, cookies, candy, chips, and soft drinks. How do I count carbohydrates in foods? There are two ways to count carbohydrates in food. You can read food labels or learn standard serving sizes of foods. You can use either of these methods or a combination of both. Using the Nutrition Facts label The Nutrition Facts list is included on the labels of almost all packaged foods and drinks in the [place] It includes: The serving size. Information about nutrients in each serving. This includes the grams of carbohydrate per serving. To use the Nutrition Facts, decide how many servings you will have. Then, multiply the number of servings by the number of carbohydrates per serving. The resulting number is the total grams of carbohydrates that you'll be having. Learning the standard serving sizes of foods When you eat carbohydrate foods that aren't packaged or don't include Nutrition Facts on the label, you need to measure the servings in order to count the grams of carbohydrates. Measure the foods that you'll eat with a food scale or measuring cup, if needed. Decide how many standard-size servings you'll eat. Multiply the number of servings by 15. For foods that contain carbohydrates, one serving equals 15 g of carbohydrates. For example, if you eat 2 cups or 10 oz (300 g) of  strawberries, you'll have eaten 2 servings and 30 g of carbohydrates (2 servings x 15 g = 30 g). For foods that have more than one food mixed, such as soups and casseroles, you must count the carbohydrates in each food that's included. Here's a list of standard serving sizes for common carbohydrate-rich foods. Each of these servings has about 15 g of carbohydrates: 1 slice of bread. 1 six-inch (15 cm) tortilla. ? cup or 2 oz (53 g) of cooked rice or pasta.  cup or 3 oz (85 g) of cooked or canned, drained, and rinsed beans or lentils.  cup or 3 oz (85 g) of a starchy vegetable, such as peas, corn, or squash.  cup or 4 oz (120 g) of hot cereal.  cup or 3 oz (85 g) of boiled or mashed potatoes, or  or 3 oz (85 g) of a large baked potato.  cup or 4 fl oz (118 mL) of fruit juice. 1 cup or 8 fl oz (237 mL) of milk. 1 small or 4 oz (106 g) apple.  or 2 oz (63 g) of a medium banana. 1 cup or 5 oz (150 g) of strawberries. 3 cups  or 1 oz (28.3 g) of popped popcorn. What is an example of carbohydrate counting? To calculate the grams of carbohydrates in this sample meal, follow the steps below. Sample meal 3 oz (85 g) chicken breast. ? cup or 4 oz (106 g) of brown rice.  cup or 3 oz (85 g) of corn. 1 cup or 8 fl oz (237 mL) of milk. 1 cup or 5 oz (150 g) of strawberries with sugar-free whipped topping. Carbohydrate calculation Identify the foods that have carbohydrates: Rice. Corn. Milk. Strawberries. Calculate how many servings you have of each food: 2 servings of rice. 1 serving of corn. 1 serving of milk. 1 serving of strawberries. Multiply each number of servings by 15 g: 2 servings of rice x 15 g = 30 g. 1 serving of corn x 15 g = 15 g. 1 serving of milk x 15 g = 15 g. 1 serving of strawberries x 15 g = 15 g. Add together all of the amounts to find the total grams of carbohydrates eaten: 30 g + 15 g + 15 g + 15 g = 75 g of carbohydrates total. Where to find more  information To learn more, go to: American Diabetes Association at diabetes.org. Click Search and type carb counting. Find the link you need. Centers for Disease Control and Prevention at TonerPromos.no. Click Search and type diabetes. Find the link you need. Academy of Nutrition and Dietetics: eatright.org This information is not intended to replace advice given to you by your health care provider. Make sure you discuss any questions you have with your health care provider. Document Revised: 10/01/2023 Document Reviewed: 10/01/2023 Elsevier Patient Education  2025 [employer].

## 2024-08-12 NOTE — Progress Notes (Signed)
 Established Patient Office Visit  Subjective   Patient ID: Laurie Kirby, female    DOB: 1961/06/29  Age: 63 y.o. MRN: [id number]  Chief Complaint  Patient presents with   Follow-up    HPI  [name] is a 63 y/o female with a history of Diabetes,Hyperlipidemia, Hypertension presents for follow up visit, She states that she is compliant with her medication, denies side effects and continues to make healthy lifestyle changes. Her HgbA1c checked during visit decreased from 7.6% to 6.8% and her blood glucose was 149 mg/dl. She states that she checks her blood glucose weekly and it's usually less than 140 mg/dl. She denies hypo/hyperglycemic symptoms, peripheral neuropathy and performs daily foot checks. Overall, she states that she's doing well and offers no further complaints.  Review of Systems  Constitutional: Negative.   Eyes: Negative.   Respiratory: Negative.    Cardiovascular: Negative.   Genitourinary: Negative.   Neurological: Negative.   Endo/Heme/Allergies: Negative.   Psychiatric/Behavioral: Negative.        Objective:     BP 136/67 (BP Location: Right Arm, Patient Position: Sitting, Cuff Size: Large)   Pulse 73   Temp 98.8 F (37.1 C) (Oral)   Ht 5' 6 (1.676 m)   Wt 156 lb 12.8 oz (71.1 kg)   SpO2 97%   BMI 25.31 kg/m  BP Readings from Last 3 Encounters:  08/12/24 136/67  05/06/24 133/78  03/11/24 123/71   Wt Readings from Last 3 Encounters:  08/12/24 156 lb 12.8 oz (71.1 kg)  05/06/24 152 lb 9.6 oz (69.2 kg)  03/11/24 155 lb 14.4 oz (70.7 kg)      Physical Exam HENT:     Head: Normocephalic and atraumatic.     Mouth/Throat:     Mouth: Mucous membranes are moist.     Pharynx: Oropharynx is clear.  Eyes:     Extraocular Movements: Extraocular movements intact.     Conjunctiva/sclera: Conjunctivae normal.     Pupils: Pupils are equal, round, and reactive to light.  Cardiovascular:     Rate and Rhythm: Normal rate and regular rhythm.      Pulses: Normal pulses.     Heart sounds: Normal heart sounds.  Pulmonary:     Effort: Pulmonary effort is normal.     Breath sounds: Normal breath sounds.  Skin:    General: Skin is warm.  Neurological:     General: No focal deficit present.     Mental Status: She is alert and oriented to person, place, and time.  Psychiatric:        Mood and Affect: Mood normal.        Behavior: Behavior normal.      Results for orders placed or performed in visit on 08/12/24  POCT Glucose (CBG)  Result Value Ref Range   POC Glucose 149 (A) 70 - 99 mg/dl  POCT HgB J8R  Result Value Ref Range   Hemoglobin A1C 6.8 (A) 4.0 - 5.6 %   HbA1c POC (<> result, manual entry)     HbA1c, POC (prediabetic range)     HbA1c, POC (controlled diabetic range)      Last CBC Lab Results  Component Value Date   WBC 14.2 (H) 04/22/2024   HGB 13.6 04/22/2024   HCT 41.9 04/22/2024   MCV 93 04/22/2024   MCH 30.2 04/22/2024   RDW 12.6 04/22/2024   PLT 263 04/22/2024   Last metabolic panel Lab Results  Component Value Date   GLUCOSE 105 (  H) 04/22/2024   NA 143 04/22/2024   K 4.2 04/22/2024   CL 108 (H) 04/22/2024   CO2 21 04/22/2024   BUN 16 04/22/2024   CREATININE 0.75 04/22/2024   EGFR 91 04/22/2024   CALCIUM  10.1 04/22/2024   PROT 7.5 04/22/2024   ALBUMIN 4.5 04/22/2024   LABGLOB 3.0 04/22/2024   AGRATIO 1.2 10/14/2023   BILITOT 0.4 04/22/2024   ALKPHOS 60 04/22/2024   AST 12 04/22/2024   ALT 15 04/22/2024   ANIONGAP 10 03/22/2022   Last lipids Lab Results  Component Value Date   CHOL 172 04/22/2024   HDL 55 04/22/2024   LDLCALC 100 (H) 04/22/2024   TRIG 92 04/22/2024   CHOLHDL 3.1 04/22/2024   Last hemoglobin A1c Lab Results  Component Value Date   HGBA1C 6.8 (A) 08/12/2024   Last thyroid functions Lab Results  Component Value Date   TSH 1.720 01/11/2021   Last vitamin D No results found for: 25OHVITD2, 25OHVITD3, VD25OH Last vitamin B12 and Folate No results found  for: VITAMINB12, FOLATE    The ASCVD Risk score (Arnett DK, et al., 2019) failed to calculate for the following reasons:   Risk score cannot be calculated because patient has a medical history suggesting prior/existing ASCVD    Assessment & Plan:   1. Diabetes mellitus without complication (HCC) (Primary) - Her diabetes is improving, her HgbA1c was 6.8% will continue current medication, low carb/non concentrated sweet diet and exercise as tolerated. - POCT HgB A1C; Future - POCT Glucose (CBG); Future - glipiZIDE  (GLUCOTROL ) 10 MG tablet; Take 2 tablets (20 mg total) by mouth daily before breakfast.  Dispense: 180 tablet; Refill: 1  metFORMIN  (GLUCOPHAGE ) 1000 MG tablet; Take 1.5 tablets (1,500 mg total) by mouth daily with breakfast.  Dispense: 270 tablet; Refill: 0 - POCT Glucose (CBG) - POCT HgB A1C  2. Essential hypertension - Her blood pressure is improving, she agreed to continue on current medication DASH diet and exercise as tolerated. - amLODipine  (NORVASC ) 5 MG tablet; Take 1 tablet (5 mg total) by mouth daily.  Dispense: 90 tablet; Refill: 1 - lisinopril  (ZESTRIL ) 40 MG tablet; Take 1 tablet (40 mg total) by mouth daily in the morning.  Dispense: 90 tablet; Refill: 1 - metoprolol  succinate (TOPROL -XL) 100 MG 24 hr tablet; Take 2 tablets (200 mg total) by mouth daily. Take with or immediately following a meal.  Dispense: 180 tablet; Refill: 1  3. 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 - aspirin  EC (ASPIRIN  LOW DOSE) 81 MG tablet; Take 1 tablet (81 mg total) by mouth daily. Swallow whole.  Dispense: 90 tablet; Refill: 0 - clopidogrel  (PLAVIX ) 75 MG tablet; Take 1 tablet (75 mg total) by mouth daily.  Dispense: 90 tablet; Refill: 1 - isosorbide  mononitrate (IMDUR ) 30  MG 24 hr tablet; Take 1 tablet (30 mg total) by mouth daily.  Dispense: 90 tablet; Refill: 1  4. Hyperlipidemia, unspecified hyperlipidemia type - She agreed to continue on current medication, low fat/cholesterol diet and exercise as tolerated. - atorvastatin  (LIPITOR ) 40 MG tablet; Take 2 tablets (80 mg total) by mouth daily.  Dispense: 90 tablet; Refill: 1  5. Gastroesophageal reflux disease without esophagitis - She agreed to continue on current medication, -Avoid spicy, fatty and fried food -Avoid sodas and sour juices -Avoid heavy meals -Avoid eating  4 hours before bedtime -Elevate head of bed at night  - famotidine  (PEPCID ) 20 MG tablet; Take 1 tablet (20 mg total) by mouth 2 (two) times daily as needed for heartburn or indigestion.  Dispense: 180 tablet; Refill: 1    -   Return in about 14 weeks (around 11/18/2024), or if symptoms worsen or fail to improve.    Zoye Chandra E Creighton Longley, NP

## 2024-08-13 ENCOUNTER — Other Ambulatory Visit: Payer: Self-pay

## 2024-08-19 ENCOUNTER — Other Ambulatory Visit: Payer: Self-pay

## 2024-08-19 DIAGNOSIS — Z0000 Encounter for general adult medical examination without abnormal findings: Secondary | ICD-10-CM

## 2024-08-23 ENCOUNTER — Other Ambulatory Visit: Payer: Self-pay

## 2024-10-16 ENCOUNTER — Other Ambulatory Visit: Payer: Self-pay

## 2024-11-18 ENCOUNTER — Ambulatory Visit: Payer: Self-pay

## 2024-12-02 ENCOUNTER — Ambulatory Visit: Payer: Self-pay

## 2024-12-02 ENCOUNTER — Ambulatory Visit: Payer: Self-pay | Admitting: Gerontology

## 2024-12-02 VITALS — BP 109/68 | HR 87 | Ht 66.0 in | Wt 159.0 lb

## 2024-12-02 DIAGNOSIS — I25119 Atherosclerotic heart disease of native coronary artery with unspecified angina pectoris: Secondary | ICD-10-CM

## 2024-12-02 DIAGNOSIS — K219 Gastro-esophageal reflux disease without esophagitis: Secondary | ICD-10-CM

## 2024-12-02 DIAGNOSIS — Z0000 Encounter for general adult medical examination without abnormal findings: Secondary | ICD-10-CM

## 2024-12-02 DIAGNOSIS — E785 Hyperlipidemia, unspecified: Secondary | ICD-10-CM

## 2024-12-02 DIAGNOSIS — I10 Essential (primary) hypertension: Secondary | ICD-10-CM

## 2024-12-02 DIAGNOSIS — E119 Type 2 diabetes mellitus without complications: Secondary | ICD-10-CM

## 2024-12-02 LAB — GLUCOSE, POCT (MANUAL RESULT ENTRY): POC Glucose: 129 mg/dL — AB (ref 70–99)

## 2024-12-02 LAB — POCT GLYCOSYLATED HEMOGLOBIN (HGB A1C): Hemoglobin A1C: 7.9 % — AB (ref 4.0–5.6)

## 2024-12-02 MED ORDER — GLIPIZIDE 10 MG PO TABS: 20.0000 mg | ORAL_TABLET | Freq: Every day | ORAL | 1 refills | Status: AC

## 2024-12-02 MED ORDER — METOPROLOL SUCCINATE ER 100 MG PO TB24: 200.0000 mg | ORAL_TABLET | Freq: Every day | ORAL | 1 refills | Status: AC

## 2024-12-02 MED ORDER — METFORMIN HCL 1000 MG PO TABS: 1500.0000 mg | ORAL_TABLET | Freq: Every day | ORAL | 0 refills | Status: AC

## 2024-12-02 MED ORDER — AMLODIPINE BESYLATE 5 MG PO TABS: 5.0000 mg | ORAL_TABLET | Freq: Every day | ORAL | 1 refills | Status: AC

## 2024-12-02 MED ORDER — LISINOPRIL 40 MG PO TABS: 40.0000 mg | ORAL_TABLET | Freq: Every day | ORAL | 1 refills | Status: AC

## 2024-12-02 MED ORDER — ASPIRIN 81 MG PO TBEC: 81.0000 mg | DELAYED_RELEASE_TABLET | Freq: Every day | ORAL | 0 refills | Status: AC

## 2024-12-02 MED ORDER — CLOPIDOGREL BISULFATE 75 MG PO TABS: 75.0000 mg | ORAL_TABLET | Freq: Every day | ORAL | 1 refills | Status: AC

## 2024-12-02 MED ORDER — FAMOTIDINE 20 MG PO TABS: 20.0000 mg | ORAL_TABLET | Freq: Two times a day (BID) | ORAL | 1 refills | Status: AC | PRN

## 2024-12-02 MED ORDER — ISOSORBIDE MONONITRATE ER 30 MG PO TB24: 30.0000 mg | ORAL_TABLET | Freq: Every day | ORAL | 1 refills | Status: AC

## 2024-12-02 MED ORDER — ATORVASTATIN CALCIUM 40 MG PO TABS: 80.0000 mg | ORAL_TABLET | Freq: Every day | ORAL | 1 refills | Status: AC

## 2024-12-02 NOTE — Progress Notes (Signed)
 "  Established Patient Office Visit  Subjective   Patient ID: Laurie Kirby, female    DOB: 1960-12-21  Age: 64 y.o. MRN: [id number]  Chief Complaint  Patient presents with   Follow-up    HPI  Ms.Grumbine is a 64 y/o female with a history of Diabetes,Hyperlipidemia, Hypertension presents for follow up visit, requests her medication be sent back to [facility]. She states that she is compliant with her medication, denies side effects and continues to make healthy lifestyle changes. Her HgbA1c checked during visit increased from 6.8% to 7.9%, and blood glucose was 129 mg/dl. She states that she checks her blood glucose as needed, denies hypo/hyperglycemic symptoms, denies peripheral neuropathy and performs daily foot checks. She has not had Mammogram nor colonoscopy done. Overall, she states that she's doing well and offers no further complaints.    Review of Systems  Constitutional: Negative.   Eyes: Negative.   Respiratory: Negative.    Cardiovascular: Negative.   Gastrointestinal: Negative.   Genitourinary: Negative.   Neurological: Negative.   Endo/Heme/Allergies: Negative.   Psychiatric/Behavioral: Negative.        Objective:     BP 109/68 (BP Location: Right Arm, Patient Position: Sitting, Cuff Size: Normal)   Pulse 87   Ht 5' 6 (1.676 m)   Wt 159 lb (72.1 kg)   SpO2 94%   BMI 25.66 kg/m  BP Readings from Last 3 Encounters:  12/02/24 109/68  08/12/24 136/67  05/06/24 133/78   Wt Readings from Last 3 Encounters:  12/02/24 159 lb (72.1 kg)  08/12/24 156 lb 12.8 oz (71.1 kg)  05/06/24 152 lb 9.6 oz (69.2 kg)      Physical Exam HENT:     Head: Normocephalic and atraumatic.     Mouth/Throat:     Mouth: Mucous membranes are moist.     Pharynx: Oropharynx is clear.  Eyes:     Extraocular Movements: Extraocular movements intact.     Conjunctiva/sclera: Conjunctivae normal.     Pupils: Pupils are equal, round, and reactive to light.   Cardiovascular:     Rate and Rhythm: Normal rate and regular rhythm.     Pulses: Normal pulses.     Heart sounds: Normal heart sounds.  Pulmonary:     Effort: Pulmonary effort is normal.     Breath sounds: Normal breath sounds.  Abdominal:     General: Bowel sounds are normal.     Palpations: Abdomen is soft.  Skin:    General: Skin is warm.     Capillary Refill: Capillary refill takes less than 2 seconds.  Neurological:     General: No focal deficit present.     Mental Status: She is alert and oriented to person, place, and time.  Psychiatric:        Mood and Affect: Mood normal.        Behavior: Behavior normal.        Thought Content: Thought content normal.        Judgment: Judgment normal.      Results for orders placed or performed in visit on 12/02/24  POCT glucose (manual entry)  Result Value Ref Range   POC Glucose 129 (A) 70 - 99 mg/dl  POCT glycosylated hemoglobin (Hb A1C)  Result Value Ref Range   Hemoglobin A1C 7.9 (A) 4.0 - 5.6 %   HbA1c POC (<> result, manual entry)     HbA1c, POC (prediabetic range)     HbA1c, POC (controlled diabetic range)  Last CBC Lab Results  Component Value Date   WBC 14.2 (H) 04/22/2024   HGB 13.6 04/22/2024   HCT 41.9 04/22/2024   MCV 93 04/22/2024   MCH 30.2 04/22/2024   RDW 12.6 04/22/2024   PLT 263 04/22/2024   Last metabolic panel Lab Results  Component Value Date   GLUCOSE 105 (H) 04/22/2024   NA 143 04/22/2024   K 4.2 04/22/2024   CL 108 (H) 04/22/2024   CO2 21 04/22/2024   BUN 16 04/22/2024   CREATININE 0.75 04/22/2024   EGFR 91 04/22/2024   CALCIUM  10.1 04/22/2024   PROT 7.5 04/22/2024   ALBUMIN 4.5 04/22/2024   LABGLOB 3.0 04/22/2024   AGRATIO 1.2 10/14/2023   BILITOT 0.4 04/22/2024   ALKPHOS 60 04/22/2024   AST 12 04/22/2024   ALT 15 04/22/2024   ANIONGAP 10 03/22/2022   Last lipids Lab Results  Component Value Date   CHOL 172 04/22/2024   HDL 55 04/22/2024   LDLCALC 100 (H) 04/22/2024    TRIG 92 04/22/2024   CHOLHDL 3.1 04/22/2024   Last hemoglobin A1c Lab Results  Component Value Date   HGBA1C 7.9 (A) 12/02/2024   Last thyroid functions Lab Results  Component Value Date   TSH 1.720 01/11/2021   Last vitamin D No results found for: 25OHVITD2, 25OHVITD3, VD25OH Last vitamin B12 and Folate No results found for: VITAMINB12, FOLATE    The ASCVD Risk score (Arnett DK, et al., 2019) failed to calculate for the following reasons:   Risk score cannot be calculated because patient has a medical history suggesting prior/existing ASCVD   * - Cholesterol units were assumed    Assessment & Plan:   1. Diabetes mellitus without complication (HCC) (Primary) - Her diabetes is improving, HgbA1c was 7.9% and goal should be less than 7%. She agrees to continue current medication, check her blood glucose daily, record and bring log to follow up appointment. She was advised to continue on low carb/non concentrated sweet diet and exercise as tolerated. - POCT glucose (manual entry) - POCT glycosylated hemoglobin (Hb A1C) - glipiZIDE  (GLUCOTROL ) 10 MG tablet; Take 2 tablets (20 mg total) by mouth daily before breakfast.  Dispense: 180 tablet; Refill: 1 -- metFORMIN  (GLUCOPHAGE ) 1000 MG tablet; Take 1.5 tablets (1,500 mg total) by mouth daily with breakfast.  Dispense: 270 tablet; Refill: 0  2. Essential hypertension - Her blood pressure is under control, will continue current medication. DASH diet and exercise as tolerated. - amLODipine  (NORVASC ) 5 MG tablet; Take 1 tablet (5 mg total) by mouth daily.  Dispense: 90 tablet; Refill: 1 - lisinopril  (ZESTRIL ) 40 MG tablet; Take 1 tablet (40 mg total) by mouth daily in the morning.  Dispense: 90 tablet; Refill: 1 - metoprolol  succinate (TOPROL -XL) 100 MG 24 hr tablet; Take 2 tablets (200 mg total) by mouth daily. Take with or immediately following a meal.  Dispense: 180 tablet; Refill: 1  3. 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 Hematochezia and active bleeding. - aspirin  EC (ASPIRIN  LOW DOSE) 81 MG tablet; Take 1 tablet (81 mg total) by mouth daily. Swallow whole.  Dispense: 90 tablet; Refill: 0 - clopidogrel  (PLAVIX ) 75 MG tablet; Take 1 tablet (75 mg total) by mouth daily.  Dispense: 90 tablet; Refill: 1 - isosorbide  mononitrate (IMDUR ) 30 MG 24 hr tablet; Take 1 tablet (30 mg  total) by mouth daily.  Dispense: 90 tablet; Refill: 1  4. Hyperlipidemia, unspecified hyperlipidemia type - She agrees to continue on current medication, low fat/cholesterol diet and exercise as tolerated. - atorvastatin  (LIPITOR ) 40 MG tablet; Take 2 tablets (80 mg total) by mouth daily.  Dispense: 90 tablet; Refill: 1  5. Gastroesophageal reflux disease without esophagitis - Acid reflux is under control with taking medication. -Avoid spicy, fatty and fried food -Avoid sodas and sour juices -Avoid heavy meals -Avoid eating 4 hours before bedtime -Elevate head of bed at night - famotidine  (PEPCID ) 20 MG tablet; Take 1 tablet (20 mg total) by mouth 2 (two) times daily as needed for heartburn or indigestion.  Dispense: 180 tablet; Refill: 1   6. Health care maintenance - She was referred for Mammogram and provided with FIT Kit and advised to return sample.    Return in about 13 weeks (around 03/03/2025), or if symptoms worsen or fail to improve.    Lory Nowaczyk E Tykerria Mccubbins, NP  "

## 2024-12-02 NOTE — Patient Instructions (Signed)
 Colorectal Cancer Screening: What to Know  Colorectal cancer screening is a group of tests used to check for colorectal cancer. These tests help find cancer before you have any symptoms. Colorectal refers to the colon and rectum, which are at the end of the digestive tract and remove waste from the body. Who should have screening? All adults who are 65-63 years old should have screening. Your health care provider may suggest starting screening even before you turn 45. How often you need these tests will depend on your results and the type of test you have. This will range from every 1-10 years. If you're between 36 and 28 years old, the suggested screening may vary based on your health. Once you're older than 85, you don't need to have this screening anymore. You may need to start screening earlier than age 33, or have tests more often, if you have any of these risk factors: A personal or family history of colorectal cancer or abnormal growths (polyps) in the colon. Inflammatory bowel disease, such as ulcerative colitis or Crohn's disease. Diabetes. Past radiation treatment to your belly or pelvic area for cancer. Certain conditions that are passed down in families, such as: Lynch syndrome. Familial adenomatous polyposis. Turcot syndrome. Peutz-Jeghers syndrome. MUTYH-associated polyposis (MAP). Cystic fibrosis (CF). Types of tests There are many types of tests to screen for colorectal cancer. You may have one or more of these tests: Guaiac-based fecal occult blood testing. This test checks a poop (stool) sample for hidden blood, which could be a sign of colorectal cancer. Fecal immunochemical test (FIT). This test checks a poop sample for blood, which could be a sign of colorectal cancer. Stool DNA test. For this test, a poop sample is checked for blood and changes in DNA that could lead to colorectal cancer. Sigmoidoscopy. This test is done to view the inside of the rectum and lower colon.  It's done using a sigmoidoscope, which is a flexible tube with a camera on the end. Colonoscopy. This test is done to view the entire colon and rectum. It uses a colonoscope, which is a flexible tube with a camera. Sometimes tissue samples are taken for testing (biopsy) or small polyps are removed during this test. Virtual colonoscopy. This test uses a CT scan to take pictures of the colon and rectum instead of using a colonoscope. A CT scan is a type of X-ray. What are the benefits of screening? Screening can reduce your chances of getting colorectal cancer. It also helps find cancer at an early stage, when the cancer can be removed or treated more easily. It's common for polyps to form in the lining of the colon, especially as you age. These polyps may be cancer or may turn into cancer over time. Screening can spot these polyps early. What are the risks of screening? Your provider will talk with you about risks. These may include: The need for more tests to confirm results from a stool sample test. Stool sample tests have fewer risks than other types of screening tests. Being exposed to low levels of radiation if you had a test that uses X-rays. This may slightly raise your cancer risk. The benefit of finding cancer outweighs the slight increase in risk. Risks when having a sigmoidoscopy or colonoscopy, such as: Bleeding or damage to the intestine. Infection. A reaction to medicines given during the test. Talk with your provider about your risk for colorectal cancer. This will help you make a screening plan that's right for you. Questions  to ask your health care provider When should I start colorectal cancer screening? What is my risk for colorectal cancer? How often do I need screening? Which screening tests do I need? How do I get my test results? What do my results mean? Where to find more information To learn more, go to these websites: American Cancer Society at prombar.it. Then: Click  Search and type colorectal cancer screening. Find the link you need. National Cancer Institute: stockbudget.co.uk This information is not intended to replace advice given to you by your health care provider. Make sure you discuss any questions you have with your health care provider. Document Revised: 10/03/2023 Document Reviewed: 10/03/2023 Elsevier Patient Education  2025 Elsevier Inc.DASH (Dietary Approaches to Stop Hypertension): Eating Plan  DASH stands for Dietary Approaches to Stop Hypertension. The DASH eating plan is a healthy eating plan that has been shown to: Lower high blood pressure (hypertension). Reduce your risk for type 2 diabetes, heart disease, and stroke. Help with weight loss. What are tips for following this plan? Reading food labels Check food labels for the amount of salt (sodium) per serving. Choose foods with less than 5 percent of the Daily Value (DV) of sodium. In general, foods with less than 300 milligrams (mg) of sodium per serving fit into this eating plan. To find whole grains, look for the word whole as the first word in the ingredient list. Shopping Buy products labeled as low-sodium or no salt added. Buy fresh foods. Avoid canned foods and pre-made or frozen meals. Cooking Try not to add salt when you cook. Use salt-free seasonings or herbs instead of table salt or sea salt. Check with your health care provider or pharmacist before using salt substitutes. Do not fry foods. Cook foods in healthy ways, such as baking, boiling, grilling, roasting, or broiling. Cook using oils that are good for your heart. These include olive, canola, avocado, soybean, and sunflower oil. Meal planning  Eat a balanced diet. This should include: 4 or more servings of fruits and 4 or more servings of vegetables each day. Try to fill half of your plate with fruits and vegetables. 6-8 servings of whole grains each day. 6 or less servings of lean meat,  poultry, or fish each day. 1 oz is 1 serving. A 3 oz (85 g) serving of meat is about the same size as the palm of your hand. One egg is 1 oz (28 g). 2-3 servings of low-fat dairy each day. One serving is 1 cup (237 mL). 1 serving of nuts, seeds, or beans 5 times each week. 2-3 servings of heart-healthy fats. Healthy fats called omega-3 fatty acids are found in foods such as walnuts, flaxseeds, fortified milks, and eggs. These fats are also found in cold-water fish, such as sardines, salmon, and mackerel. Limit how much you eat of: Canned or prepackaged foods. Food that is high in trans fat, such as fried foods. Food that is high in saturated fat, such as fatty meat. Desserts and other sweets, sugary drinks, and other foods with added sugar. Full-fat dairy products. Do not salt foods before eating. Do not eat more than 4 egg yolks a week. Try to eat at least 2 vegetarian meals a week. Eat more home-cooked food and less restaurant, buffet, and fast food. Lifestyle When eating at a restaurant, ask if your food can be made with less salt or no salt. If you drink alcohol: Limit how much you have to: 0-1 drink a day if you are female.  0-2 drinks a day if you are female. Know how much alcohol is in your drink. In the [place] one drink is one 12 oz bottle of beer (355 mL), one 5 oz glass of wine (148 mL), or one 1 oz glass of hard liquor (44 mL). General information Avoid eating more than 2,300 mg of salt a day. If you have hypertension, you may need to reduce your sodium intake to 1,500 mg a day. Work with your provider to stay at a healthy body weight or lose weight. Ask what the best weight range is for you. On most days of the week, get at least 30 minutes of exercise that causes your heart to beat faster. This may include walking, swimming, or biking. Work with your provider or dietitian to adjust your eating plan to meet your specific calorie needs. What foods should I eat? Fruits All fresh,  dried, or frozen fruit. Canned fruits that are in their natural juice and do not have sugar added to them. Vegetables Fresh or frozen vegetables that are raw, steamed, roasted, or grilled. Low-sodium or reduced-sodium tomato and vegetable juice. Low-sodium or reduced-sodium tomato sauce and tomato paste. Low-sodium or reduced-sodium canned vegetables. Grains Whole-grain or whole-wheat bread. Whole-grain or whole-wheat pasta. Brown rice. [place]neil Madeira. Bulgur. Whole-grain and low-sodium cereals. Pita bread. Low-fat, low-sodium crackers. Whole-wheat flour tortillas. Meats and other proteins Skinless chicken or turkey. Ground chicken or turkey. Pork with fat trimmed off. Fish and seafood. Egg whites. Dried beans, peas, or lentils. Unsalted nuts, nut butters, and seeds. Unsalted canned beans. Lean cuts of beef with fat trimmed off. Low-sodium, lean precooked or cured meat, such as sausages or meat loaves. Dairy Low-fat (1%) or fat-free (skim) milk. Reduced-fat, low-fat, or fat-free cheeses. Nonfat, low-sodium ricotta or cottage cheese. Low-fat or nonfat yogurt. Low-fat, low-sodium cheese. Fats and oils Soft margarine without trans fats. Vegetable oil. Reduced-fat, low-fat, or light mayonnaise and salad dressings (reduced-sodium). Canola, safflower, olive, avocado, soybean, and sunflower oils. Avocado. Seasonings and condiments Herbs. Spices. Seasoning mixes without salt. Other foods Unsalted popcorn and pretzels. Fat-free sweets. The items listed above may not be all the foods and drinks you can have. Talk to a dietitian to learn more. What foods should I avoid? Fruits Canned fruit in a light or heavy syrup. Fried fruit. Fruit in cream or butter sauce. Vegetables Creamed or fried vegetables. Vegetables in a cheese sauce. Regular canned vegetables that are not marked as low-sodium or reduced-sodium. Regular canned tomato sauce and paste that are not marked as low-sodium or reduced-sodium. Regular  tomato and vegetable juices that are not marked as low-sodium or reduced-sodium. Dene. Olives. Grains Baked goods made with fat, such as croissants, muffins, or some breads. Dry pasta or rice meal packs. Meats and other proteins Fatty cuts of meat. Ribs. Fried meat. [place]ona. Bologna, salami, and other precooked or cured meats, such as sausages or meat loaves, that are not lean and low in sodium. Fat from the back of a pig (fatback). Bratwurst. Salted nuts and seeds. Canned beans with added salt. Canned or smoked fish. Whole eggs or egg yolks. Chicken or turkey with skin. Dairy Whole or 2% milk, cream, and half-and-half. Whole or full-fat cream cheese. Whole-fat or sweetened yogurt. Full-fat cheese. Nondairy creamers. Whipped toppings. Processed cheese and cheese spreads. Fats and oils Butter. Stick margarine. Lard. Shortening. Ghee. Bacon fat. Tropical oils, such as coconut, palm kernel, or palm oil. Seasonings and condiments Onion salt, garlic salt, seasoned salt, table salt, and sea salt.  Worcestershire sauce. Tartar sauce. Barbecue sauce. Teriyaki sauce. Soy sauce, including reduced-sodium soy sauce. Steak sauce. Canned and packaged gravies. Fish sauce. Oyster sauce. Cocktail sauce. Store-bought horseradish. Ketchup. Mustard. Meat flavorings and tenderizers. Bouillon cubes. Hot sauces. Pre-made or packaged marinades. Pre-made or packaged taco seasonings. Relishes. Regular salad dressings. Other foods Salted popcorn and pretzels. The items listed above may not be all the foods and drinks you should avoid. Talk to a dietitian to learn more. Where to find more information National Heart, Lung, and Blood Institute (NHLBI): buffalodrycleaner.gl American Heart Association (AHA): heart.org Academy of Nutrition and Dietetics: eatright.org National Kidney Foundation (NKF): kidney.org This information is not intended to replace advice given to you by your health care provider. Make sure you discuss any  questions you have with your health care provider. Document Revised: 08/20/2024 Document Reviewed: 10/31/2022 Elsevier Patient Education  2025 Elsevier Inc.Diabetes: Carbohydrate Counting for Adults Carbohydrate counting is a method of keeping track of how many carbohydrates you eat. Eating carbohydrates increases the amount of sugar, also called glucose, in your blood. By counting how many carbohydrates you eat, you can improve how well you manage your blood sugar. This, in turn, helps you manage your diabetes. Carbohydrates are measured in grams (g) per serving. It's important to know how many carbohydrates (in grams or by serving size) you can have in each meal. This is different for every person. A dietitian can help you make a meal plan and calculate how many carbohydrates you should have at each meal and snack. What foods contain carbohydrates? Carbohydrates are found in these foods: Grains, such as breads and cereals. Dried beans and soy products. Starchy vegetables, such as potatoes, peas, and corn. Fruit and fruit juices. Milk and yogurt. Sweets and snack foods like cake, cookies, candy, chips, and soft drinks. How do I count carbohydrates in foods? There are two ways to count carbohydrates in food. You can read food labels or learn standard serving sizes of foods. You can use either of these methods or a combination of both. Using the Nutrition Facts label The Nutrition Facts list is included on the labels of almost all packaged foods and drinks in the [place] It includes: The serving size. Information about nutrients in each serving. This includes the grams of carbohydrate per serving. To use the Nutrition Facts, decide how many servings you will have. Then, multiply the number of servings by the number of carbohydrates per serving. The resulting number is the total grams of carbohydrates that you'll be having. Learning the standard serving sizes of foods When you eat carbohydrate foods  that aren't packaged or don't include Nutrition Facts on the label, you need to measure the servings in order to count the grams of carbohydrates. Measure the foods that you'll eat with a food scale or measuring cup, if needed. Decide how many standard-size servings you'll eat. Multiply the number of servings by 15. For foods that contain carbohydrates, one serving equals 15 g of carbohydrates. For example, if you eat 2 cups or 10 oz (300 g) of strawberries, you'll have eaten 2 servings and 30 g of carbohydrates (2 servings x 15 g = 30 g). For foods that have more than one food mixed, such as soups and casseroles, you must count the carbohydrates in each food that's included. Here's a list of standard serving sizes for common carbohydrate-rich foods. Each of these servings has about 15 g of carbohydrates: 1 slice of bread. 1 six-inch (15 cm) tortilla. ? cup or 2  oz (53 g) of cooked rice or pasta.  cup or 3 oz (85 g) of cooked or canned, drained, and rinsed beans or lentils.  cup or 3 oz (85 g) of a starchy vegetable, such as peas, corn, or squash.  cup or 4 oz (120 g) of hot cereal.  cup or 3 oz (85 g) of boiled or mashed potatoes, or  or 3 oz (85 g) of a large baked potato.  cup or 4 fl oz (118 mL) of fruit juice. 1 cup or 8 fl oz (237 mL) of milk. 1 small or 4 oz (106 g) apple.  or 2 oz (63 g) of a medium banana. 1 cup or 5 oz (150 g) of strawberries. 3 cups or 1 oz (28.3 g) of popped popcorn. What is an example of carbohydrate counting? To calculate the grams of carbohydrates in this sample meal, follow the steps below. Sample meal 3 oz (85 g) chicken breast. ? cup or 4 oz (106 g) of brown rice.  cup or 3 oz (85 g) of corn. 1 cup or 8 fl oz (237 mL) of milk. 1 cup or 5 oz (150 g) of strawberries with sugar-free whipped topping. Carbohydrate calculation Identify the foods that have carbohydrates: Rice. Corn. Milk. Strawberries. Calculate how many servings you have of each  food: 2 servings of rice. 1 serving of corn. 1 serving of milk. 1 serving of strawberries. Multiply each number of servings by 15 g: 2 servings of rice x 15 g = 30 g. 1 serving of corn x 15 g = 15 g. 1 serving of milk x 15 g = 15 g. 1 serving of strawberries x 15 g = 15 g. Add together all of the amounts to find the total grams of carbohydrates eaten: 30 g + 15 g + 15 g + 15 g = 75 g of carbohydrates total. Where to find more information To learn more, go to: American Diabetes Association at diabetes.org. Click Search and type carb counting. Find the link you need. Centers for Disease Control and Prevention at tonerpromos.no. Click Search and type diabetes. Find the link you need. Academy of Nutrition and Dietetics: eatright.org This information is not intended to replace advice given to you by your health care provider. Make sure you discuss any questions you have with your health care provider. Document Revised: 10/01/2023 Document Reviewed: 10/01/2023 Elsevier Patient Education  2025 [employer].

## 2025-03-03 ENCOUNTER — Ambulatory Visit: Payer: Self-pay
